# Patient Record
Sex: Female | Born: 1935 | Race: Black or African American | Hispanic: No | Smoking: Former smoker
Health system: Southern US, Community
[De-identification: ages and names within clinical notes are randomized; demographics above are authoritative.]

## PROBLEM LIST (undated history)

## (undated) DIAGNOSIS — E119 Type 2 diabetes mellitus without complications: Secondary | ICD-10-CM

## (undated) DIAGNOSIS — E7800 Pure hypercholesterolemia, unspecified: Secondary | ICD-10-CM

## (undated) DIAGNOSIS — I10 Essential (primary) hypertension: Secondary | ICD-10-CM

## (undated) DIAGNOSIS — M109 Gout, unspecified: Secondary | ICD-10-CM

## (undated) DIAGNOSIS — I639 Cerebral infarction, unspecified: Secondary | ICD-10-CM

## (undated) HISTORY — PX: TUBAL LIGATION: SHX77

---

## 2004-07-09 ENCOUNTER — Ambulatory Visit: Payer: Self-pay | Admitting: Gastroenterology

## 2004-07-12 ENCOUNTER — Ambulatory Visit: Payer: Self-pay | Admitting: Gastroenterology

## 2005-12-08 ENCOUNTER — Ambulatory Visit: Payer: Self-pay | Admitting: Family Medicine

## 2007-08-31 ENCOUNTER — Ambulatory Visit: Payer: Self-pay | Admitting: Internal Medicine

## 2008-11-05 ENCOUNTER — Ambulatory Visit: Payer: Self-pay | Admitting: Family Medicine

## 2010-01-18 ENCOUNTER — Ambulatory Visit: Payer: Self-pay | Admitting: Family Medicine

## 2011-01-20 ENCOUNTER — Ambulatory Visit: Payer: Self-pay | Admitting: Family Medicine

## 2012-01-27 ENCOUNTER — Ambulatory Visit: Payer: Self-pay | Admitting: Family Medicine

## 2012-01-31 ENCOUNTER — Ambulatory Visit: Payer: Self-pay | Admitting: Family Medicine

## 2013-02-19 ENCOUNTER — Ambulatory Visit: Payer: Self-pay | Admitting: Family Medicine

## 2014-02-27 ENCOUNTER — Ambulatory Visit: Payer: Self-pay | Admitting: Family Medicine

## 2015-04-27 ENCOUNTER — Emergency Department: Payer: Medicare PPO

## 2015-04-27 ENCOUNTER — Observation Stay
Admission: EM | Admit: 2015-04-27 | Discharge: 2015-04-28 | Disposition: A | Discharge status: Home / Self Care | Payer: Medicare PPO | Attending: Internal Medicine | Admitting: Internal Medicine

## 2015-04-27 DIAGNOSIS — Z9851 Tubal ligation status: Secondary | ICD-10-CM | POA: Insufficient documentation

## 2015-04-27 DIAGNOSIS — E1165 Type 2 diabetes mellitus with hyperglycemia: Secondary | ICD-10-CM | POA: Insufficient documentation

## 2015-04-27 DIAGNOSIS — I639 Cerebral infarction, unspecified: Secondary | ICD-10-CM

## 2015-04-27 DIAGNOSIS — I10 Essential (primary) hypertension: Secondary | ICD-10-CM | POA: Insufficient documentation

## 2015-04-27 DIAGNOSIS — R4781 Slurred speech: Secondary | ICD-10-CM | POA: Diagnosis not present

## 2015-04-27 DIAGNOSIS — M8580 Other specified disorders of bone density and structure, unspecified site: Secondary | ICD-10-CM | POA: Insufficient documentation

## 2015-04-27 DIAGNOSIS — G459 Transient cerebral ischemic attack, unspecified: Secondary | ICD-10-CM | POA: Diagnosis not present

## 2015-04-27 DIAGNOSIS — E780 Pure hypercholesterolemia: Secondary | ICD-10-CM | POA: Insufficient documentation

## 2015-04-27 DIAGNOSIS — R42 Dizziness and giddiness: Secondary | ICD-10-CM | POA: Insufficient documentation

## 2015-04-27 DIAGNOSIS — Z8249 Family history of ischemic heart disease and other diseases of the circulatory system: Secondary | ICD-10-CM | POA: Diagnosis not present

## 2015-04-27 DIAGNOSIS — Z79899 Other long term (current) drug therapy: Secondary | ICD-10-CM | POA: Insufficient documentation

## 2015-04-27 DIAGNOSIS — R55 Syncope and collapse: Secondary | ICD-10-CM | POA: Diagnosis not present

## 2015-04-27 DIAGNOSIS — Z888 Allergy status to other drugs, medicaments and biological substances status: Secondary | ICD-10-CM | POA: Diagnosis not present

## 2015-04-27 DIAGNOSIS — E785 Hyperlipidemia, unspecified: Secondary | ICD-10-CM | POA: Diagnosis not present

## 2015-04-27 HISTORY — DX: Essential (primary) hypertension: I10

## 2015-04-27 HISTORY — DX: Pure hypercholesterolemia, unspecified: E78.00

## 2015-04-27 HISTORY — DX: Type 2 diabetes mellitus without complications: E11.9

## 2015-04-27 LAB — CBC WITH DIFFERENTIAL/PLATELET
BASOS ABS: 0.1 10*3/uL (ref 0–0.1)
Basophils Relative: 1 %
Eosinophils Absolute: 0.3 10*3/uL (ref 0–0.7)
Eosinophils Relative: 4 %
HEMATOCRIT: 33.9 % — AB (ref 35.0–47.0)
HEMOGLOBIN: 10.4 g/dL — AB (ref 12.0–16.0)
LYMPHS PCT: 38 %
Lymphs Abs: 2.9 10*3/uL (ref 1.0–3.6)
MCH: 22.4 pg — ABNORMAL LOW (ref 26.0–34.0)
MCHC: 30.8 g/dL — ABNORMAL LOW (ref 32.0–36.0)
MCV: 72.5 fL — AB (ref 80.0–100.0)
MONO ABS: 0.6 10*3/uL (ref 0.2–0.9)
Monocytes Relative: 8 %
NEUTROS ABS: 3.8 10*3/uL (ref 1.4–6.5)
NEUTROS PCT: 49 %
Platelets: 306 10*3/uL (ref 150–440)
RBC: 4.67 MIL/uL (ref 3.80–5.20)
RDW: 15.7 % — AB (ref 11.5–14.5)
WBC: 7.8 10*3/uL (ref 3.6–11.0)

## 2015-04-27 LAB — BASIC METABOLIC PANEL
ANION GAP: 10 (ref 5–15)
BUN: 19 mg/dL (ref 6–20)
CHLORIDE: 101 mmol/L (ref 101–111)
CO2: 27 mmol/L (ref 22–32)
Calcium: 9 mg/dL (ref 8.9–10.3)
Creatinine, Ser: 1.03 mg/dL — ABNORMAL HIGH (ref 0.44–1.00)
GFR calc Af Amer: 60 mL/min — ABNORMAL LOW (ref >60)
GFR calc non Af Amer: 51 mL/min — ABNORMAL LOW (ref >60)
GLUCOSE: 144 mg/dL — AB (ref 65–99)
POTASSIUM: 3.2 mmol/L — AB (ref 3.5–5.1)
Sodium: 138 mmol/L (ref 135–145)

## 2015-04-27 LAB — URINALYSIS COMPLETE WITH MICROSCOPIC (ARMC ONLY)
BACTERIA UA: NONE SEEN
Bilirubin Urine: NEGATIVE
GLUCOSE, UA: NEGATIVE mg/dL
Hgb urine dipstick: NEGATIVE
KETONES UR: NEGATIVE mg/dL
Leukocytes, UA: NEGATIVE
NITRITE: NEGATIVE
Protein, ur: NEGATIVE mg/dL
RBC / HPF: NONE SEEN RBC/hpf (ref 0–5)
SPECIFIC GRAVITY, URINE: 1.006 (ref 1.005–1.030)
Squamous Epithelial / LPF: NONE SEEN
pH: 6 (ref 5.0–8.0)

## 2015-04-27 LAB — TROPONIN I

## 2015-04-27 NOTE — ED Notes (Signed)
Went to MD today and BP was elevated. Did daily activities, returned to MD for observation and was sent home. Went home and took a nap and when she woke up, felt "funny".

## 2015-04-27 NOTE — ED Provider Notes (Addendum)
[facility] Emergency Department Provider Note  ____________________________________________  Time seen: Seen upon arrival to the emergency department  I have reviewed the triage vital signs and the nursing notes.   HISTORY  Chief Complaint Hypertension    HPI Anne Herrera is a 79 y.o. female with a history of hypertension and diabetes and high cholesterol who is presenting today with 1 hour of lightheadedness and near syncope. The patient said that she last felt normal at 4 PM. She said that she took a nap at 4 PM and awoke at 5 and when she stood up and became lightheaded and felt like she was going to pass out. She denied any chest pain or shortness of breath at that time. However, did state that she felt as if she was off balance needed to grab onto something to brace herself. She denied any dizziness. Denied any racing feeling in her chest or palpitations. Says that she is a strong family history of stroke. Was having some difficulty with her blood pressure today at a routine physical. [place] Spaniel that her pressures were elevated but came down with repeat measurement. She reports compliant with her medication. No headache. Denies any weakness.Said took 181 mg tablet of aspirin earlier this evening. Lightheadedness resolved after one hour.   Past Medical History  Diagnosis Date  . Diabetes mellitus without complication   . Hypertension   . High cholesterol     There are no active problems to display for this patient.   Past Surgical History  Procedure Laterality Date  . Tubal ligation      No current outpatient prescriptions on file.  Allergies Amlodipine and Lisinopril  No family history on file.  Social History Social History  Substance Use Topics  . Smoking status: Never Smoker   . Smokeless tobacco: Never Used  . Alcohol Use: No    Review of Systems Constitutional: No fever/chills Eyes: No visual changes. ENT: No sore  throat. Cardiovascular: Denies chest pain. Respiratory: Denies shortness of breath. Gastrointestinal: No abdominal pain.  No nausea, no vomiting.  No diarrhea.  No constipation. Genitourinary: Negative for dysuria. Musculoskeletal: Negative for back pain. Skin: Negative for rash. Neurological: Negative for headaches, focal weakness or numbness.  10-point ROS otherwise negative.  ____________________________________________   PHYSICAL EXAM:  VITAL SIGNS: ED Triage Vitals  Enc Vitals Group     BP 04/27/15 2034 172/95 mmHg     Pulse Rate 04/27/15 2034 62     Resp 04/27/15 2034 18     Temp 04/27/15 2034 98.3 F (36.8 C)     Temp Source 04/27/15 2034 Oral     SpO2 04/27/15 2034 98 %     Weight 04/27/15 2034 188 lb (85.276 kg)     Height 04/27/15 2034 5\' 4"  (1.626 m)     Head Cir --      Peak Flow --      Pain Score 04/27/15 2035 0     Pain Loc --      Pain Edu? --      Excl. in GC? --     Constitutional: Alert and oriented. Well appearing and in no acute distress. Eyes: Conjunctivae are normal. PERRL. EOMI. Head: Atraumatic. Nose: No congestion/rhinnorhea. Mouth/Throat: Mucous membranes are moist.  Oropharynx non-erythematous. Neck: No stridor.   Cardiovascular: Normal rate, regular rhythm. Grossly normal heart sounds.  Good peripheral circulation. Respiratory: Normal respiratory effort.  No retractions. Lungs CTAB. Gastrointestinal: Soft and nontender. No distention. No abdominal bruits. No  CVA tenderness. Musculoskeletal: No lower extremity tenderness nor edema.  No joint effusions. Neurologic:  Normal speech and language. Patient is unstable transferring from the stretcher to the bed. Needs assistance for this transfer. Also with right sided upper extremity ataxia on finger to nose testing. No nystagmus. Skin:  Skin is warm, dry and intact. No rash noted. Psychiatric: Mood and affect are normal. Speech and behavior are normal.  NIH Stroke Scale   Person  Administering Scale: Arelia Longest  Administer stroke scale items in the order listed. Record performance in each category after each subscale exam. Do not go back and change scores. Follow directions provided for each exam technique. Scores should reflect what the patient does, not what the clinician thinks the patient can do. The clinician should record answers while administering the exam and work quickly. Except where indicated, the patient should not be coached (i.e., repeated requests to patient to make a special effort).   1a  Level of consciousness: 0=alert; keenly responsive  1b. LOC questions:  0=Performs both tasks correctly  1c. LOC commands: 0=Performs both tasks correctly  2.  Best Gaze: 0=normal  3.  Visual: 0=No visual loss  4. Facial Palsy: 0=Normal symmetric movement  5a.  Motor left arm: 0=No drift, limb holds 90 (or 45) degrees for full 10 seconds  5b.  Motor right arm: 0=No drift, limb holds 90 (or 45) degrees for full 10 seconds  6a. motor left leg: 0=No drift, limb holds 90 (or 45) degrees for full 10 seconds  6b  Motor right leg:  0=No drift, limb holds 90 (or 45) degrees for full 10 seconds  7. Limb Ataxia: 1=Present in one limb  8.  Sensory: 0=Normal; no sensory loss  9. Best Language:  0=No aphasia, normal  10. Dysarthria: 0=Normal  11. Extinction and Inattention: 0=No abnormality  12. Distal motor function: 0=Normal   Total:   1   ____________________________________________   LABS (all labs ordered are listed, but only abnormal results are displayed)  Labs Reviewed  URINALYSIS COMPLETEWITH MICROSCOPIC ([facility] ONLY) - Abnormal; Notable for the following:    Color, Urine STRAW (*)    APPearance CLEAR (*)    All other components within normal limits  CBC WITH DIFFERENTIAL/PLATELET - Abnormal; Notable for the following:    Hemoglobin 10.4 (*)    HCT 33.9 (*)    MCV 72.5 (*)    MCH 22.4 (*)    MCHC 30.8 (*)    RDW 15.7 (*)    All other components  within normal limits  BASIC METABOLIC PANEL - Abnormal; Notable for the following:    Potassium 3.2 (*)    Glucose, Bld 144 (*)    Creatinine, Ser 1.03 (*)    GFR calc non Af Amer 51 (*)    GFR calc Af Amer 60 (*)    All other components within normal limits  TROPONIN I   ____________________________________________  EKG  ED ECG REPORT I, Arelia Longest, the attending physician, personally viewed and interpreted this ECG.   Date: 04/27/2015  EKG Time: 2041  Rate: 65  Rhythm: Normal sinus rhythm with sinus arrhythmia  Axis: Normal axis  Intervals:none  ST&T Change: No ST segment elevations or depressions. No abnormal T-wave inversions.  ____________________________________________  RADIOLOGY  The brain which is normal for her age with mild atrophy. ____________________________________________   PROCEDURES  ____________________________________________   INITIAL IMPRESSION / ASSESSMENT AND PLAN / ED COURSE  Pertinent labs & imaging results  that were available during my care of the patient were reviewed by me and considered in my medical decision making (see chart for details).  Patient last normal prior to sleeping 4 and a half hours prior to arrival which puts her out of the TPA window for stroke. Also, the patient had improvement of her symptoms with resolution of the lightheadedness.   ----------------------------------------- 12:00 AM on 04/28/2015 -----------------------------------------  Patient resting comfortably at this time. The [facility] neurology was consult because when the patient was assisted to the bathroom she was dragging her right foot and said that she felt right-sided weakness to her right lower as well as upper extremity. There was also a facial droop on the right that was thought to be seen by the nurse. However, when I dilated the patient she has symmetric smile and her friend who is at her bedside said that her smile appeared normal for her. The  [facility] neurologist did not recommend TPA at this time. He kept a window at 4 PM needed patient was still outside the appropriate time frame for TPA. Patient took 81 mg of aspirin prior to arrival. We'll give 242 more milligrams here in the emergency department. Patient as well as companion are aware of the plan for admission and further workup and are willing to comply.  ____________________________________________   FINAL CLINICAL IMPRESSION(S) / ED DIAGNOSES  Acute stroke. Initial visit.    Myrna Blazer, MD 04/28/15 0004  Sign out to Dr. Clint Guy for admission to the hospital.  Myrna Blazer, MD 04/28/15 [phone]

## 2015-04-27 NOTE — ED Notes (Signed)
Patient states she is feeling weaker on the right side. Per the tech that assisted her to the bathroom, the patient was dragging her right leg.

## 2015-04-27 NOTE — ED Notes (Signed)
Nephrology SOC in progress.

## 2015-04-28 ENCOUNTER — Observation Stay: Payer: Medicare PPO

## 2015-04-28 DIAGNOSIS — G459 Transient cerebral ischemic attack, unspecified: Secondary | ICD-10-CM | POA: Diagnosis present

## 2015-04-28 LAB — GLUCOSE, CAPILLARY
Glucose-Capillary: 157 mg/dL — ABNORMAL HIGH (ref 65–99)
Glucose-Capillary: 154 mg/dL — ABNORMAL HIGH (ref 65–99)

## 2015-04-28 LAB — HEMOGLOBIN A1C: Hgb A1c MFr Bld: 7.8 % — ABNORMAL HIGH (ref 4.0–6.0)

## 2015-04-28 LAB — TSH: TSH: 2.56 u[IU]/mL (ref 0.350–4.500)

## 2015-04-28 MED ORDER — STROKE: EARLY STAGES OF RECOVERY BOOK
Freq: Once | Status: AC
  Administered 2015-04-28: 03:00:00

## 2015-04-28 MED ORDER — CHLORTHALIDONE 25 MG PO TABS
25.0000 mg | ORAL_TABLET | Freq: Every day | ORAL | Status: DC
  Administered 2015-04-28: 08:00:00 25 mg via ORAL
  Filled 2015-04-28: qty 1

## 2015-04-28 MED ORDER — SODIUM CHLORIDE 0.9 % IJ SOLN
3.0000 mL | Freq: Two times a day (BID) | INTRAMUSCULAR | Status: DC
  Administered 2015-04-28: 3 mL via INTRAVENOUS

## 2015-04-28 MED ORDER — SIMVASTATIN 20 MG PO TABS: 20.0000 mg | ORAL_TABLET | Freq: Every day | ORAL | Status: DC

## 2015-04-28 MED ORDER — CARVEDILOL 25 MG PO TABS
25.0000 mg | ORAL_TABLET | Freq: Two times a day (BID) | ORAL | Status: DC
  Administered 2015-04-28: 25 mg via ORAL
  Filled 2015-04-28: qty 1

## 2015-04-28 MED ORDER — ATORVASTATIN CALCIUM 10 MG PO TABS: 5.0000 mg | ORAL_TABLET | Freq: Every day | ORAL | Status: DC

## 2015-04-28 MED ORDER — ONDANSETRON HCL 4 MG PO TABS: 4.0000 mg | ORAL_TABLET | Freq: Four times a day (QID) | ORAL | Status: DC | PRN

## 2015-04-28 MED ORDER — INSULIN ASPART 100 UNIT/ML ~~LOC~~ SOLN
0.0000 [IU] | Freq: Three times a day (TID) | SUBCUTANEOUS | Status: DC
  Administered 2015-04-28 (×2): 3 [IU] via SUBCUTANEOUS
  Filled 2015-04-28 (×2): qty 3

## 2015-04-28 MED ORDER — ACETAMINOPHEN 650 MG RE SUPP: 650.0000 mg | Freq: Four times a day (QID) | RECTAL | Status: DC | PRN

## 2015-04-28 MED ORDER — INSULIN ASPART 100 UNIT/ML ~~LOC~~ SOLN: 0.0000 [IU] | Freq: Every day | SUBCUTANEOUS | Status: DC

## 2015-04-28 MED ORDER — POTASSIUM CHLORIDE CRYS ER 20 MEQ PO TBCR
40.0000 meq | EXTENDED_RELEASE_TABLET | Freq: Two times a day (BID) | ORAL | Status: DC
  Administered 2015-04-28 (×2): 40 meq via ORAL
  Filled 2015-04-28 (×2): qty 2

## 2015-04-28 MED ORDER — ASPIRIN 81 MG PO CHEW
243.0000 mg | CHEWABLE_TABLET | Freq: Once | ORAL | Status: AC
  Administered 2015-04-28: 243 mg via ORAL
  Filled 2015-04-28: qty 3

## 2015-04-28 MED ORDER — ACETAMINOPHEN 325 MG PO TABS: 650.0000 mg | ORAL_TABLET | Freq: Four times a day (QID) | ORAL | Status: DC | PRN

## 2015-04-28 MED ORDER — HEPARIN SODIUM (PORCINE) 5000 UNIT/ML IJ SOLN
5000.0000 [IU] | Freq: Three times a day (TID) | INTRAMUSCULAR | Status: DC
  Administered 2015-04-28: 06:00:00 5000 [IU] via SUBCUTANEOUS
  Filled 2015-04-28: qty 1

## 2015-04-28 MED ORDER — ASPIRIN 325 MG PO TABS: 325.0000 mg | ORAL_TABLET | Freq: Every day | ORAL | Status: DC

## 2015-04-28 MED ORDER — LOSARTAN POTASSIUM 50 MG PO TABS
100.0000 mg | ORAL_TABLET | Freq: Every day | ORAL | Status: DC
  Administered 2015-04-28: 100 mg via ORAL
  Filled 2015-04-28: qty 2

## 2015-04-28 MED ORDER — DOCUSATE SODIUM 100 MG PO CAPS
100.0000 mg | ORAL_CAPSULE | Freq: Two times a day (BID) | ORAL | Status: DC
  Filled 2015-04-28: qty 1

## 2015-04-28 MED ORDER — ONDANSETRON HCL 4 MG/2ML IJ SOLN: 4.0000 mg | Freq: Four times a day (QID) | INTRAMUSCULAR | Status: DC | PRN

## 2015-04-28 MED ORDER — ASPIRIN 325 MG PO TABS
325.0000 mg | ORAL_TABLET | Freq: Every day | ORAL | Status: DC
  Administered 2015-04-28: 325 mg via ORAL
  Filled 2015-04-28: qty 1

## 2015-04-28 NOTE — H&P (Signed)
Anne Herrera is an 79 y.o. female.   Chief Complaint: Imbalance HPI: The patient presents emergency department via EMS after becoming lightheaded and feeling as if she were leaning to her right side. The patient denies falling. She also denies nausea, vomiting or visual changes. When EMS arrived to her home she reports they found her blood pressure to be 170/120. She denies headache, chest pain or shortness of breath. In the emergency department the patient was found to drag her right foot as she walked which prompted urgent neurologic consult. CT of her head was negative but [facility] consultation recommended valuation for stroke, albeit that the patient was outside of the wound oh for TPA. For these reasons emergency department staff called for admission.  Past Medical History  Diagnosis Date  . Diabetes mellitus without complication   . Hypertension   . High cholesterol     Past Surgical History  Procedure Laterality Date  . Tubal ligation      Family History  Problem Relation Age of Onset  . Hypertension      all family members  . CAD Mother   . CAD Brother    Social History:  reports that she has never smoked. She has never used smokeless tobacco. She reports that she does not drink alcohol or use illicit drugs.  Allergies:  Allergies  Allergen Reactions  . Amlodipine Hives and Itching  . Lisinopril Swelling    Reaction:  Mouth swelling    Medications Prior to Admission  Medication Sig Dispense Refill  . atorvastatin (LIPITOR) 10 MG tablet Take 5 mg by mouth daily.    . carvedilol (COREG) 25 MG tablet Take 25 mg by mouth 2 (two) times daily with a meal.    . chlorthalidone (HYGROTON) 25 MG tablet Take 25 mg by mouth daily.    Marland Kitchen glipiZIDE (GLUCOTROL XL) 2.5 MG 24 hr tablet Take 2.5 mg by mouth daily.    Marland Kitchen losartan (COZAAR) 100 MG tablet Take 100 mg by mouth daily.    . metFORMIN (GLUCOPHAGE-XR) 500 MG 24 hr tablet Take 1,000 mg by mouth daily.    . potassium chloride  (MICRO-K) 10 MEQ CR capsule Take 20 mEq by mouth daily.      Results for orders placed or performed during the hospital encounter of 04/27/15 (from the past 48 hour(s))  Troponin I     Status: None   Collection Time: 04/27/15  9:20 PM  Result Value Ref Range   Troponin I <0.03 <0.031 ng/mL    Comment:        NO INDICATION OF MYOCARDIAL INJURY.   CBC with Differential     Status: Abnormal   Collection Time: 04/27/15  9:20 PM  Result Value Ref Range   WBC 7.8 3.6 - 11.0 K/uL   RBC 4.67 3.80 - 5.20 MIL/uL   Hemoglobin 10.4 (L) 12.0 - 16.0 g/dL   HCT 33.9 (L) 35.0 - 47.0 %   MCV 72.5 (L) 80.0 - 100.0 fL   MCH 22.4 (L) 26.0 - 34.0 pg   MCHC 30.8 (L) 32.0 - 36.0 g/dL   RDW 15.7 (H) 11.5 - 14.5 %   Platelets 306 150 - 440 K/uL   Neutrophils Relative % 49 %   Neutro Abs 3.8 1.4 - 6.5 K/uL   Lymphocytes Relative 38 %   Lymphs Abs 2.9 1.0 - 3.6 K/uL   Monocytes Relative 8 %   Monocytes Absolute 0.6 0.2 - 0.9 K/uL   Eosinophils Relative 4 %  Eosinophils Absolute 0.3 0 - 0.7 K/uL   Basophils Relative 1 %   Basophils Absolute 0.1 0 - 0.1 K/uL  Basic metabolic panel     Status: Abnormal   Collection Time: 04/27/15  9:20 PM  Result Value Ref Range   Sodium 138 135 - 145 mmol/L   Potassium 3.2 (L) 3.5 - 5.1 mmol/L   Chloride 101 101 - 111 mmol/L   CO2 27 22 - 32 mmol/L   Glucose, Bld 144 (H) 65 - 99 mg/dL   BUN 19 6 - 20 mg/dL   Creatinine, Ser 1.03 (H) 0.44 - 1.00 mg/dL   Calcium 9.0 8.9 - 10.3 mg/dL   GFR calc non Af Amer 51 (L) >60 mL/min   GFR calc Af Amer 60 (L) >60 mL/min    Comment: (NOTE) The eGFR has been calculated using the CKD EPI equation. This calculation has not been validated in all clinical situations. eGFR's persistently <60 mL/min signify possible Chronic Kidney Disease.    Anion gap 10 5 - 15  Urinalysis complete, with microscopic ([facility] only)     Status: Abnormal   Collection Time: 04/27/15 10:52 PM  Result Value Ref Range   Color, Urine STRAW (A) YELLOW    APPearance CLEAR (A) CLEAR   Glucose, UA NEGATIVE NEGATIVE mg/dL   Bilirubin Urine NEGATIVE NEGATIVE   Ketones, ur NEGATIVE NEGATIVE mg/dL   Specific Gravity, Urine 1.006 1.005 - 1.030   Hgb urine dipstick NEGATIVE NEGATIVE   pH 6.0 5.0 - 8.0   Protein, ur NEGATIVE NEGATIVE mg/dL   Nitrite NEGATIVE NEGATIVE   Leukocytes, UA NEGATIVE NEGATIVE   RBC / HPF NONE SEEN 0 - 5 RBC/hpf   WBC, UA 0-5 0 - 5 WBC/hpf   Bacteria, UA NONE SEEN NONE SEEN   Squamous Epithelial / LPF NONE SEEN NONE SEEN   Mucous PRESENT    Dg Chest 1 View  04/27/2015   CLINICAL DATA:  Dizziness  EXAM: CHEST  1 VIEW  COMPARISON:  None.  FINDINGS: The heart size and mediastinal contours are within normal limits. Both lungs are clear. The aorta is unfolded and ectatic. The visualized skeletal structures are unremarkable.  IMPRESSION: No active disease.   Electronically Signed   By: Conchita Paris M.D.   On: 04/27/2015 22:02   Ct Head Wo Contrast  04/27/2015   CLINICAL DATA:  On blood pressure today, near syncope, dizzy  EXAM: CT HEAD WITHOUT CONTRAST  TECHNIQUE: Contiguous axial images were obtained from the base of the skull through the vertex without intravenous contrast.  COMPARISON:  None.  FINDINGS: Mild age-appropriate atrophy. No evidence of hemorrhage or infarct. No mass or hydrocephalus. Calvarium intact.  IMPRESSION: Head CT normal for age with mild atrophy.   Electronically Signed   By: Skipper Cliche M.D.   On: 04/27/2015 21:48    Review of Systems  Constitutional: Negative for fever and chills.  HENT: Negative for sore throat and tinnitus.   Eyes: Negative for blurred vision and redness.  Respiratory: Negative for cough and shortness of breath.   Cardiovascular: Negative for chest pain, palpitations, orthopnea and PND.  Gastrointestinal: Negative for nausea, vomiting, abdominal pain and diarrhea.  Genitourinary: Negative for dysuria, urgency and frequency.  Musculoskeletal: Negative for myalgias and joint  pain.  Skin: Negative for rash.       No lesions  Neurological: Positive for speech change. Negative for focal weakness and weakness.       Lightheadedness; imbalance (to the right)  Endo/Heme/Allergies: Does not bruise/bleed easily.       No temperature intolerance  Psychiatric/Behavioral: Negative for depression and suicidal ideas.    Blood pressure 164/69, pulse 54, temperature 97.8 F (36.6 C), temperature source Oral, resp. rate 20, height _0  (1.626 m), weight 85.73 kg (189 lb), SpO2 97 %. Physical Exam  Vitals reviewed. Constitutional: She is oriented to person, place, and time. She appears well-developed and well-nourished. No distress.  HENT:  Head: Normocephalic and atraumatic.  Mouth/Throat: Oropharynx is clear and moist.  Eyes: Conjunctivae and EOM are normal. Pupils are equal, round, and reactive to light. No scleral icterus.  Neck: Normal range of motion. Neck supple. No JVD present. No tracheal deviation present. No thyromegaly present.  Cardiovascular: Normal rate, regular rhythm and normal heart sounds.  Exam reveals no gallop and no friction rub.   No murmur heard. Respiratory: Effort normal and breath sounds normal.  GI: Soft. Bowel sounds are normal. She exhibits no distension. There is no tenderness.  Genitourinary:  Deferred  Musculoskeletal: Normal range of motion. She exhibits no edema.  Lymphadenopathy:    She has no cervical adenopathy.  Neurological: She is alert and oriented to person, place, and time. She has normal strength. A cranial nerve deficit and sensory deficit (mildly muted sensation right calf) is present. She exhibits normal muscle tone. Coordination normal.  Subtle right-sided facial droop  Skin: Skin is warm and dry.  Psychiatric: She has a normal mood and affect. Her behavior is normal. Judgment and thought content normal.     Assessment/Plan This is a 79 year old African American female admitted for TIA. 1. TIA: At this time the  patient has some minor sensory discrepancy between the left lower extremity and right leg, as well as a slight right-sided facial droop. No acute infarct seen on CT scan however we will obtain MRI of the brain and carotid ultrasounds. Neurology consultation ordered for the morning. Permissive hypertension until that time. 2. Hypertension: Continue home regimen in the morning 3. Diabetes mellitus type 2: Hold metformin and oral hypoglycemics; start sliding scale insulin while hospitalized 4. Hyperlipidemia: Continue atorvastatin 5. DVT prophylaxis: Heparin 6. GI prophylaxis: None The patient is a full code. Time spent on admission orders and patient care approximately 35 minutes  Harrie Foreman 04/28/2015, 3:38 AM

## 2015-04-28 NOTE — Consult Note (Signed)
Reason for Consult: stroke Referring Physician:  Dr. Aram Candela Anne Herrera is an 79 y.o. female.  HPI: seen at request of Dr. Benjie Karvonen for possible stroke;  79 yo RHD F presents to [facility] secondary to sudden onset of dizziness and falling to R after she awakened.  In ER, she was not a tPA candidate due to unknown onset time.  She also noted that she was dragging her R leg.  She denies LOC or headache.  Past Medical History  Diagnosis Date  . Diabetes mellitus without complication   . Hypertension   . High cholesterol     Past Surgical History  Procedure Laterality Date  . Tubal ligation      Family History  Problem Relation Age of Onset  . Hypertension      all family members  . CAD Mother   . CAD Brother     Social History:  reports that she has never smoked. She has never used smokeless tobacco. She reports that she does not drink alcohol or use illicit drugs.  Allergies:  Allergies  Allergen Reactions  . Amlodipine Hives and Itching  . Lisinopril Swelling    Reaction:  Mouth swelling    Medications: personally reviewed by me per chart  Results for orders placed or performed during the hospital encounter of 04/27/15 (from the past 48 hour(s))  Troponin I     Status: None   Collection Time: 04/27/15  9:20 PM  Result Value Ref Range   Troponin I <0.03 <0.031 ng/mL    Comment:        NO INDICATION OF MYOCARDIAL INJURY.   CBC with Differential     Status: Abnormal   Collection Time: 04/27/15  9:20 PM  Result Value Ref Range   WBC 7.8 3.6 - 11.0 K/uL   RBC 4.67 3.80 - 5.20 MIL/uL   Hemoglobin 10.4 (L) 12.0 - 16.0 g/dL   HCT 33.9 (L) 35.0 - 47.0 %   MCV 72.5 (L) 80.0 - 100.0 fL   MCH 22.4 (L) 26.0 - 34.0 pg   MCHC 30.8 (L) 32.0 - 36.0 g/dL   RDW 15.7 (H) 11.5 - 14.5 %   Platelets 306 150 - 440 K/uL   Neutrophils Relative % 49 %   Neutro Abs 3.8 1.4 - 6.5 K/uL   Lymphocytes Relative 38 %   Lymphs Abs 2.9 1.0 - 3.6 K/uL   Monocytes Relative 8 %   Monocytes  Absolute 0.6 0.2 - 0.9 K/uL   Eosinophils Relative 4 %   Eosinophils Absolute 0.3 0 - 0.7 K/uL   Basophils Relative 1 %   Basophils Absolute 0.1 0 - 0.1 K/uL  Basic metabolic panel     Status: Abnormal   Collection Time: 04/27/15  9:20 PM  Result Value Ref Range   Sodium 138 135 - 145 mmol/L   Potassium 3.2 (L) 3.5 - 5.1 mmol/L   Chloride 101 101 - 111 mmol/L   CO2 27 22 - 32 mmol/L   Glucose, Bld 144 (H) 65 - 99 mg/dL   BUN 19 6 - 20 mg/dL   Creatinine, Ser 1.03 (H) 0.44 - 1.00 mg/dL   Calcium 9.0 8.9 - 10.3 mg/dL   GFR calc non Af Amer 51 (L) >60 mL/min   GFR calc Af Amer 60 (L) >60 mL/min    Comment: (NOTE) The eGFR has been calculated using the CKD EPI equation. This calculation has not been validated in all clinical situations. eGFR's persistently <60 mL/min  signify possible Chronic Kidney Disease.    Anion gap 10 5 - 15  TSH     Status: None   Collection Time: 04/27/15  9:20 PM  Result Value Ref Range   TSH 2.560 0.350 - 4.500 uIU/mL  Hemoglobin A1c     Status: Abnormal   Collection Time: 04/27/15  9:20 PM  Result Value Ref Range   Hgb A1c MFr Bld 7.8 (H) 4.0 - 6.0 %  Urinalysis complete, with microscopic ([facility] only)     Status: Abnormal   Collection Time: 04/27/15 10:52 PM  Result Value Ref Range   Color, Urine STRAW (A) YELLOW   APPearance CLEAR (A) CLEAR   Glucose, UA NEGATIVE NEGATIVE mg/dL   Bilirubin Urine NEGATIVE NEGATIVE   Ketones, ur NEGATIVE NEGATIVE mg/dL   Specific Gravity, Urine 1.006 1.005 - 1.030   Hgb urine dipstick NEGATIVE NEGATIVE   pH 6.0 5.0 - 8.0   Protein, ur NEGATIVE NEGATIVE mg/dL   Nitrite NEGATIVE NEGATIVE   Leukocytes, UA NEGATIVE NEGATIVE   RBC / HPF NONE SEEN 0 - 5 RBC/hpf   WBC, UA 0-5 0 - 5 WBC/hpf   Bacteria, UA NONE SEEN NONE SEEN   Squamous Epithelial / LPF NONE SEEN NONE SEEN   Mucous PRESENT   Glucose, capillary     Status: Abnormal   Collection Time: 04/28/15  7:36 AM  Result Value Ref Range   Glucose-Capillary 157  (H) 65 - 99 mg/dL   Comment 1 Notify RN   Glucose, capillary     Status: Abnormal   Collection Time: 04/28/15 11:12 AM  Result Value Ref Range   Glucose-Capillary 154 (H) 65 - 99 mg/dL   Comment 1 Notify RN     Dg Chest 1 View  04/27/2015   CLINICAL DATA:  Dizziness  EXAM: CHEST  1 VIEW  COMPARISON:  None.  FINDINGS: The heart size and mediastinal contours are within normal limits. Both lungs are clear. The aorta is unfolded and ectatic. The visualized skeletal structures are unremarkable.  IMPRESSION: No active disease.   Electronically Signed   By: Conchita Paris M.D.   On: 04/27/2015 22:02   Ct Head Wo Contrast  04/27/2015   CLINICAL DATA:  On blood pressure today, near syncope, dizzy  EXAM: CT HEAD WITHOUT CONTRAST  TECHNIQUE: Contiguous axial images were obtained from the base of the skull through the vertex without intravenous contrast.  COMPARISON:  None.  FINDINGS: Mild age-appropriate atrophy. No evidence of hemorrhage or infarct. No mass or hydrocephalus. Calvarium intact.  IMPRESSION: Head CT normal for age with mild atrophy.   Electronically Signed   By: Skipper Cliche M.D.   On: 04/27/2015 21:48   Mr Brain Wo Contrast  04/28/2015   CLINICAL DATA:  79 year old diabetic hypertensive female with high cholesterol presenting with lightheadedness originating 1 day ago. Leaning towards the right. Dragging right foot and slurred speech. Symptoms have improved. Subsequent encounter.  EXAM: MRI HEAD WITHOUT CONTRAST  TECHNIQUE: Multiplanar, multiecho pulse sequences of the brain and surrounding structures were obtained without intravenous contrast.  COMPARISON:  04/27/2015 head CT.  FINDINGS: Acute nonhemorrhagic infarct extends from the posterior superior left lenticular nucleus into the mid to posterior left corona radiata.  No intracranial hemorrhage.  Minimal small vessel disease type changes.  No hydrocephalus.  No intracranial mass lesion noted on this unenhanced exam.  Major intracranial  vascular structures are patent.  Minimal exophthalmos.  Cervical medullary junction, pituitary region and pineal region unremarkable.  Hyperostosis  frontalis interna incidentally noted.  IMPRESSION: Acute nonhemorrhagic infarct extends from the posterior superior left lenticular nucleus into the mid to posterior left corona radiata.  These results will be called to the ordering clinician or representative by the Radiologist Assistant, and communication documented in the PACS or zVision Dashboard.   Electronically Signed   By: Genia Del M.D.   On: 04/28/2015 10:20   US Carotid Bilateral  04/28/2015   CLINICAL DATA:  Stroke symptoms, hypertension, hyperlipidemia and diabetes  EXAM: BILATERAL CAROTID DUPLEX ULTRASOUND  TECHNIQUE: Pearline Cables scale imaging, color Doppler and duplex ultrasound were performed of bilateral carotid and vertebral arteries in the neck.  COMPARISON:  None.  FINDINGS: Criteria: Quantification of carotid stenosis is based on velocity parameters that correlate the residual internal carotid diameter with NASCET-based stenosis levels, using the diameter of the distal internal carotid lumen as the denominator for stenosis measurement.  The following velocity measurements were obtained:  RIGHT  ICA:  91/26 cm/sec  CCA:  87/56 cm/sec  SYSTOLIC ICA/CCA RATIO:  1.3  DIASTOLIC ICA/CCA RATIO:  1.9  ECA:  40 cm/sec  LEFT  ICA:  77/29 cm/sec  CCA:  43/32 cm/sec  SYSTOLIC ICA/CCA RATIO:  1.1  DIASTOLIC ICA/CCA RATIO:  1.9  ECA:  38 cm/sec  RIGHT CAROTID ARTERY: Minor echogenic shadowing plaque formation. No hemodynamically significant right ICA stenosis, velocity elevation, or turbulent flow. Degree of narrowing less than 50%.  RIGHT VERTEBRAL ARTERY:  Antegrade  LEFT CAROTID ARTERY: Similar scattered minor echogenic plaque formation. No hemodynamically significant left ICA stenosis, velocity elevation, or turbulent flow.  LEFT VERTEBRAL ARTERY:  Antegrade  IMPRESSION: Minor carotid atherosclerosis. No  hemodynamically significant ICA stenosis by ultrasound. Degree of narrowing less than 50% bilaterally.   Electronically Signed   By: Jerilynn Mages.  Shick M.D.   On: 04/28/2015 11:49    Review of Systems  Constitutional: Negative.   HENT: Negative.   Eyes: Negative.   Respiratory: Negative.   Cardiovascular: Negative.   Gastrointestinal: Negative.   Genitourinary: Negative.   Musculoskeletal: Negative.   Skin: Negative.   Neurological: Positive for focal weakness. Negative for dizziness, tingling, tremors, sensory change, speech change, seizures and loss of consciousness.  Endo/Heme/Allergies: Negative.   Psychiatric/Behavioral: Negative.    Blood pressure 177/73, pulse 49, temperature 98.1 F (36.7 C), temperature source Oral, resp. rate 18, height 5' 4"  (1.626 m), weight 85.73 kg (189 lb), SpO2 99 %. Physical Exam  Nursing note and vitals reviewed. Constitutional: She appears well-developed and well-nourished. No distress.  HENT:  Head: Normocephalic and atraumatic.  Right Ear: External ear normal.  Left Ear: External ear normal.  Nose: Nose normal.  Mouth/Throat: Oropharynx is clear and moist.  Eyes: Conjunctivae and EOM are normal. Pupils are equal, round, and reactive to light. No scleral icterus.  Neck: Normal range of motion.  Cardiovascular: Normal rate, regular rhythm, normal heart sounds and intact distal pulses.   Respiratory: Effort normal and breath sounds normal. No respiratory distress.  GI: Soft. Bowel sounds are normal. She exhibits no distension.  Musculoskeletal: Normal range of motion. She exhibits no edema.  Neurological:  Alert and oriented x 3, nl speech and language PERRLA, EOMI, nl VF, face symmetric, tongue mid 5/5 on L, 4/5 on R, nl tone FTN and HTS WNL 1+/4 B, mute plantars Decreased temp and pin on R hemibody  Skin: She is not diaphoretic.   MRI of brain personally reviewed by me and shows acute L IC infarct, trace white matter  changes  Assessment/Plan: 1.  Acute L internal capsule infarct-  Symptomatic with sensory and motor changes on L;  Etiology is secondary to uncontrolled DM, HTN and likely HLD -  Restart ASA 18m daily -  Adjust statin for goal LDL < 70 -  Needs better DM control with goal Hem A1c < 7 -  Needs better HTN control which can start now with goal < 130/80 -  Would be ok for outpatient PT -  Recommended that pt lose some weight and continue to exercise -  Will sign off, please call with questions -  [facility]  SFriedens8/30/2016, 4:20 PM

## 2015-04-28 NOTE — Evaluation (Signed)
Physical Therapy Evaluation Patient Details Name: Anne Herrera MRN: [id number] DOB: 1935/12/13 Today's Date: 04/28/2015   History of Present Illness  Pt is a 79 y.o. female presenting to hospital with imbalance, lightheadedness, and leaning to R side; pt dragging R foot in ED and noted to have R facial droop.  Head CT negative.  MRI brain shows acute nonhemorrhagic infarct extends from posterior superior L lenticular nucleus into mid to posterior L corona radiata.  Clinical Impression  Currently pt demonstrates impairments with R UE and R LE strength and limitations with functional mobility.  Prior to admission, pt was independent without AD.  Pt lives alone in 1 level home with 2 steps to enter (no railing).  Currently pt is supervision to CGA with transfers and ambulation without AD; pt with increased B lateral sway but no loss of balance noted (pt also reporting R LE feeling "heavy" with ambulation).  Pt would benefit from skilled PT to address above noted impairments and functional limitations.  Recommend pt discharge to home with OP PT and support of family when medically appropriate.     Follow Up Recommendations Outpatient PT    Equipment Recommendations  None recommended by PT    Recommendations for Other Services       Precautions / Restrictions Precautions Precautions: Fall Restrictions Weight Bearing Restrictions: No      Mobility  Bed Mobility Overal bed mobility: Independent                Transfers Overall transfer level: Needs assistance Equipment used: None Transfers: Sit to/from Stand;Stand Pivot Transfers Sit to Stand: Supervision Stand pivot transfers: Supervision       General transfer comment: steady without loss of balance  Ambulation/Gait Ambulation/Gait assistance: Supervision;Min guard Ambulation Distance (Feet): 140 Feet Assistive device: None Gait Pattern/deviations: Step-through pattern     General Gait Details: pt reporting R  LE feeling "heavy"; mild increased lateral sway B but no loss of balance noted; limited distance d/t pt reporting feeling "fuzzy" in her head (BP 177/73 sitting in chair post ambulation); good B heelstrike  Stairs            Wheelchair Mobility    Modified Rankin (Stroke Patients Only)       Balance Overall balance assessment: Needs assistance Sitting-balance support: No upper extremity supported;Feet supported Sitting balance-Leahy Scale: Normal     Standing balance support: No upper extremity supported Standing balance-Leahy Scale: Good                               Pertinent Vitals/Pain Pain Assessment: No/denies pain  Pre-ambulation pt's HR 54 bpm and O2 99% on room air. Post ambulation pt's HR 49 bpm (returned to 50-60 bpm with rest) and O2 99% on room air and BP 177/73 (nursing notified).    Home Living Family/patient expects to be discharged to:: Private residence Living Arrangements: Alone Available Help at Discharge: Family   Home Access: Stairs to enter Entrance Stairs-Rails: None Entrance Stairs-Number of Steps: 2 Home Layout: One level Home Equipment: None      Prior Function Level of Independence: Independent         Comments: Drives     Hand Dominance        Extremity/Trunk Assessment   Upper Extremity Assessment: RUE deficits/detail;LUE deficits/detail RUE Deficits / Details: R shoulder flexion 4/5; R elbow flexion 4+/5; R elbow extension 4+/5; R grip strength decreased compared to L  LUE Deficits / Details: WFL   Lower Extremity Assessment: RLE deficits/detail;LLE deficits/detail RLE Deficits / Details: R hip flexion 4-/5; R knee extension 4+/5; R knee flexion 4+/5; R DF 4+/5 (light touch intact; intact proprioception) LLE Deficits / Details: WFL  Cervical / Trunk Assessment: Normal  Communication   Communication: No difficulties  Cognition Arousal/Alertness: Awake/alert Behavior During Therapy: WFL for tasks  assessed/performed Overall Cognitive Status: Within Functional Limits for tasks assessed                      General Comments General comments (skin integrity, edema, etc.): R facial droop noted  Nursing cleared pt for participation in physical therapy.  Pt agreeable to PT session.    Exercises        Assessment/Plan    PT Assessment Patient needs continued PT services  PT Diagnosis Difficulty walking   PT Problem List Decreased strength;Decreased balance;Decreased mobility  PT Treatment Interventions DME instruction;Gait training;Stair training;Functional mobility training;Therapeutic activities;Therapeutic exercise;Balance training;Patient/family education   PT Goals (Current goals can be found in the Care Plan section) Acute Rehab PT Goals Patient Stated Goal: To go home PT Goal Formulation: With patient Time For Goal Achievement: 05/12/15 Potential to Achieve Goals: Good    Frequency 7X/week   Barriers to discharge        Co-evaluation               End of Session Equipment Utilized During Treatment: Gait belt Activity Tolerance: Other (comment) (Limited ambulation d/t pt feeling "fuzzy" in her head with ambulation) Patient left: in chair;with call bell/phone within reach;with family/visitor present Nurse Communication: Mobility status (BP and HR vitals)    Functional Assessment Tool Used: AM-PAC without stairs Functional Limitation: Mobility: Walking and moving around Mobility: Walking and Moving Around Current Status (W1191): At least 20 percent but less than 40 percent impaired, limited or restricted Mobility: Walking and Moving Around Goal Status [phone]): 0 percent impaired, limited or restricted    Time: 1110-1145 PT Time Calculation (min) (ACUTE ONLY): 35 min   Charges:   PT Evaluation $Initial PT Evaluation Tier I: 1 Procedure     PT G Codes:   PT G-Codes **NOT FOR INPATIENT CLASS** Functional Assessment Tool Used: AM-PAC without  stairs Functional Limitation: Mobility: Walking and moving around Mobility: Walking and Moving Around Current Status (F6213): At least 20 percent but less than 40 percent impaired, limited or restricted Mobility: Walking and Moving Around Goal Status [phone]): 0 percent impaired, limited or restricted    [facility] 04/28/2015, 1:34 PM Hendricks Limes, PT [phone]

## 2015-04-28 NOTE — Care Management (Signed)
Admitted to [facility] with the diagnosis of TIA. Lives alone. Son is Nhung Danko [phone]). No Life Alert, but requested information. Will give information on Alert monitoring offered by this facility. Seen Dr. Leim Fabry yesterday at [facility] in [place]. No Home Health. No skilled facility. Uses no aids for ambulation. Takes care of all basic and instrumental activities of living herself, still drives. Good appetite. Balance issues yesterday. Son will transport. Gwenette Greet RN MSN Care Management [phone]

## 2015-04-28 NOTE — Discharge Summary (Signed)
[facility] [place] [facility]   PATIENT NAME: Anne Herrera    MR#:  [id number]  DATE OF BIRTH:  [birth year]  DATE OF ADMISSION:  04/27/2015 ADMITTING PHYSICIAN: Arnaldo Natal, MD  DATE OF DISCHARGE: 04/28/2015 PRIMARY CARE PHYSICIAN: ALDRIDGE,BARBARA, MD    ADMISSION DIAGNOSIS:  CVA (cerebral infarction) [I63.9] Cerebral infarction due to unspecified mechanism [I63.9]  DISCHARGE DIAGNOSIS:  Active Problems:   TIA (transient ischemic attack)   SECONDARY DIAGNOSIS:   Past Medical History  Diagnosis Date  . Diabetes mellitus without complication   . Hypertension   . High cholesterol     HOSPITAL COURSE:  This is a very pleasant 79 year old female with diabetes and hypertension who presented with abnormal gait and weakness on the right side.  1. Acute nonhemorrhagic infarct extends from the posterior superior left lenticular nucleus into the mid to posterior left corona radiata: Patient underwent MRI and carotid Doppler. She was started on aspirin and continued on statin medication. She was seen and evaluated by physical therapy.  2. Essential hypertension: Patient will continue outpatient medications including losartan and Coreg.   3. Hyperlipidemia: Continue atorvastatin.  4. Diabetes: Patient is on metformin and glipizide at home which she will continue.  DISCHARGE CONDITIONS AND DIET:  Patient is being discharged home with home health on heart healthy diet in stable condition  CONSULTS OBTAINED:  Treatment Team:  Mellody Drown, MD  DRUG ALLERGIES:   Allergies  Allergen Reactions  . Amlodipine Hives and Itching  . Lisinopril Swelling    Reaction:  Mouth swelling    DISCHARGE MEDICATIONS:   Current Discharge Medication List    START taking these medications   Details  aspirin 325 MG tablet Take 1 tablet (325 mg total) by mouth daily. Qty: 120 tablet, Refills: 0      CONTINUE these medications which have NOT CHANGED    Details  atorvastatin (LIPITOR) 10 MG tablet Take 5 mg by mouth daily.    carvedilol (COREG) 25 MG tablet Take 25 mg by mouth 2 (two) times daily with a meal.    chlorthalidone (HYGROTON) 25 MG tablet Take 25 mg by mouth daily.    glipiZIDE (GLUCOTROL XL) 2.5 MG 24 hr tablet Take 2.5 mg by mouth daily.    losartan (COZAAR) 100 MG tablet Take 100 mg by mouth daily.    metFORMIN (GLUCOPHAGE-XR) 500 MG 24 hr tablet Take 1,000 mg by mouth daily.    potassium chloride (MICRO-K) 10 MEQ CR capsule Take 20 mEq by mouth daily.              Today   CHIEF COMPLAINT:  Patient reports improved symptoms.   VITAL SIGNS:  Blood pressure 157/62, pulse 71, temperature 98.1 F (36.7 C), temperature source Oral, resp. rate 18, height  (1.626 m), weight 85.73 kg (189 lb), SpO2 98 %.   REVIEW OF SYSTEMS:  Review of Systems  Constitutional: Negative for fever, chills and malaise/fatigue.  HENT: Negative for sore throat.   Eyes: Negative for blurred vision.  Respiratory: Negative for cough, hemoptysis, shortness of breath and wheezing.   Cardiovascular: Negative for chest pain, palpitations and leg swelling.  Gastrointestinal: Negative for nausea, vomiting, abdominal pain, diarrhea and blood in stool.  Genitourinary: Negative for dysuria.  Musculoskeletal: Negative for back pain.  Neurological: Positive for speech change and focal weakness. Negative for dizziness, tremors and headaches.  Endo/Heme/Allergies: Does not bruise/bleed easily.     PHYSICAL EXAMINATION:  GENERAL:  79 y.o.-year-old  patient lying in the bed with no acute distress.  NECK:  Supple, no jugular venous distention. No thyroid enlargement, no tenderness.  LUNGS: Normal breath sounds bilaterally, no wheezing, rales,rhonchi  No use of accessory muscles of respiration.  CARDIOVASCULAR: S1, S2 normal. No murmurs, rubs, or gallops.  ABDOMEN: Soft, non-tender, non-distended. Bowel sounds present. No organomegaly or  mass.  EXTREMITIES: No pedal edema, cyanosis, or clubbing.  PSYCHIATRIC: The patient is alert and oriented x 3.  SKIN: No obvious rash, lesion, or ulcer.  Neuro: Patient has slightly slurred speech with very minimal right lower extremity weakness areas sensation is intact. DATA REVIEW:   CBC  Recent Labs Lab 04/27/15 2120  WBC 7.8  HGB 10.4*  HCT 33.9*  PLT 306    Chemistries   Recent Labs Lab 04/27/15 2120  NA 138  K 3.2*  CL 101  CO2 27  GLUCOSE 144*  BUN 19  CREATININE 1.03*  CALCIUM 9.0    Cardiac Enzymes  Recent Labs Lab 04/27/15 2120  TROPONINI <0.03    Microbiology Results  @MICRORSLT48 @  RADIOLOGY:  Dg Chest 1 View  04/27/2015   CLINICAL DATA:  Dizziness  EXAM: CHEST  1 VIEW  COMPARISON:  None.  FINDINGS: The heart size and mediastinal contours are within normal limits. Both lungs are clear. The aorta is unfolded and ectatic. The visualized skeletal structures are unremarkable.  IMPRESSION: No active disease.   Electronically Signed   By: Christiana Pellant M.D.   On: 04/27/2015 22:02   Ct Head Wo Contrast  04/27/2015   CLINICAL DATA:  On blood pressure today, near syncope, dizzy  EXAM: CT HEAD WITHOUT CONTRAST  TECHNIQUE: Contiguous axial images were obtained from the base of the skull through the vertex without intravenous contrast.  COMPARISON:  None.  FINDINGS: Mild age-appropriate atrophy. No evidence of hemorrhage or infarct. No mass or hydrocephalus. Calvarium intact.  IMPRESSION: Head CT normal for age with mild atrophy.   Electronically Signed   By: Esperanza Heir M.D.   On: 04/27/2015 21:48   Mr Brain Wo Contrast  04/28/2015   CLINICAL DATA:  79 year old diabetic hypertensive female with high cholesterol presenting with lightheadedness originating 1 day ago. Leaning towards the right. Dragging right foot and slurred speech. Symptoms have improved. Subsequent encounter.  EXAM: MRI HEAD WITHOUT CONTRAST  TECHNIQUE: Multiplanar, multiecho pulse  sequences of the brain and surrounding structures were obtained without intravenous contrast.  COMPARISON:  04/27/2015 head CT.  FINDINGS: Acute nonhemorrhagic infarct extends from the posterior superior left lenticular nucleus into the mid to posterior left corona radiata.  No intracranial hemorrhage.  Minimal small vessel disease type changes.  No hydrocephalus.  No intracranial mass lesion noted on this unenhanced exam.  Major intracranial vascular structures are patent.  Minimal exophthalmos.  Cervical medullary junction, pituitary region and pineal region unremarkable.  Hyperostosis frontalis interna incidentally noted.  IMPRESSION: Acute nonhemorrhagic infarct extends from the posterior superior left lenticular nucleus into the mid to posterior left corona radiata.  These results will be called to the ordering clinician or representative by the Radiologist Assistant, and communication documented in the PACS or zVision Dashboard.   Electronically Signed   By: Lacy Duverney M.D.   On: 04/28/2015 10:20      Management plans discussed with the patient and she is in agreement. Stable for discharge home with home health  Patient should follow up with PCP in one week  CODE STATUS:     Code Status Orders  Start     Ordered   04/28/15 0222  Full code   Continuous     04/28/15 0221      TOTAL TIME TAKING CARE OF THIS PATIENT: 35 minutes.    Rolly Magri M.D on 04/28/2015 at 11:27 AM  Between 7am to 6pm - Pager - [phone] After 6pm go to [email or web address] - password [email or web address] [facility]  [facility]  Office  [phone]  CC: Primary care physician; Leim Fabry, MD

## 2015-05-05 ENCOUNTER — Encounter: Payer: Self-pay | Admitting: *Deleted

## 2015-05-05 NOTE — PMR Pre-admission (Signed)
[facility] PMR Admission Coordinator Pre-Admission Assessment  Patient: Anne Herrera is an 79 y.o., female MRN: [id number] DOB: Nov 15, 1935 Height: 5' 4"  (162.6 cm) Weight: 85.548 kg (188 lb 9.6 oz)  Insurance Information HMO:      PPO: Yes     PCP:       IPA:       80/20:       OTHER:  Group # [id number] PRIMARY: [employer]vantage      Policy#: [id number]      Subscriber: Jolee Ewing CM Name:  Claiborne Billings      Phone#: [phone]     Fax#: [phone] Pre-Cert#: [id number] with update after each team conference     Employer: Retired Benefits:  Phone #: [phone]     Name: Ida Rogue. Date: 08/29/14     Deduct:  $0      Out of Pocket Max: $4900 (met $87.66)      Life Max:  unlimited CIR: $275 days 1-7      SNF: $0 days 1-20; $160 days 21-100 Outpatient: medical necessity     Co-Pay: $15 copay Home Health: 100%      Co-Pay: none DME: 80%     Co-Pay: 20% Providers: in network  Medicaid Application Date:        Case Manager:   Disability Application Date:        Case Worker:    Emergency Facilities manager Information    Name Relation Home Work [place] Son [phone]        Current Medical History  Patient Admitting Diagnosis:  L posterior putamen/corona radiata infarct  History of Present Illness:  A 79 yo female admitted 04/29/15 to [facility] with HTN, HL OSA, anemia and recent stroke 04/27/15.  She was seen and treated at [facility] and discharged home 04/28/15 with plan for outpatient rehab.  She now presents to the [facility] ED as a code stroke.  The patient shares a story that she was alone in her home on Sunday when she developed leaning to her right side and mild weakness right side when she decided to call EMS as she was concerned that she was having a stroke.  She was admitted to [facility] and evaluated for stroke with an MRI that reportedly revealed a small left sided infarct.  She was discharged on optimum medical  therapy with plan for outpatient rehab to be initiated by her PCP.  However, in the evening of 04/28/15 she felt that she had a more noticeable weakness on her right with more pronounced facial droop, which her son also agreed with.  She eventually fell asleep.  Upon awakening at 3 am to use the restroom, she had a fall of which she did not hit her head or experience LOC.  She was then brought in to [facility] by EMS for further evaluations.  MRI on 05/01/15 showed infarct involving left posterior putamen and left posterior corona radiata had enlarged slightly with comparison to 04/29/15.  No hemorrhage.  Initially placed on Dysphagia 3 mechanical soft, thin liquids with upgrade by speech therapy on 05/05/15 to a regular diet with thin liqiuids.  PT/OT/ST evaluations were completed with therapies on going and recommendations for inpatient rehab admission.  Patient's medical record from [facility] has been reviewed by the rehabilitation admission coordinator and physician.  NIH Stroke scale: 5 on admission to [facility] on 04/29/15  Past Medical History  Past Medical History  Diagnosis  Date  . Diabetes mellitus without complication   . Hypertension   . High cholesterol   Additional Medical History:  Obstructive sleep apnea with CPAP at home, anemia, hyperlipidemia  Family History   family history includes CAD in her brother and mother; Hypertension in an other family member.  Prior Rehab/Hospitalizations Has the patient had major surgery during 100 days prior to admission? No   Current Medication:  See current MAR Aspirin, atorvastatin, lovenox, fluoxetine, lispro  Patients Current Diet:  Regular diet with thin liquids  Precautions / Restrictions Fall risk   Has the patient had 2 or more falls or a fall with injury in the past year?Yes.  Has had 2 controlled falls since 04/27/15 with no injury while going to the bathroom and while toileting.  Prior Activity Level Community (5-7x/wk):  Active.  Went out daily.  worked out at [employer].  Walked 1 1/2 - 2 miles a day.  Was driving.  Prior Functional Level Self Care: Did the patient need help bathing, dressing, using the toilet or eating?  Independent  Indoor Mobility: Did the patient need assistance with walking from room to room (with or without device)? Independent  Stairs: Did the patient need assistance with internal or external stairs (with or without device)? Independent  Functional Cognition: Did the patient need help planning regular tasks such as shopping or remembering to take medications? Independent  Home Assistive Devices / Equipment Home Assistive Devices/Equipment: CPAP Home Equipment: None  Prior Device Use: Indicate devices/aids used by the patient prior to current illness, exacerbation or injury? None   Prior Functional Level Current Functional Level  Bed Mobility  Independent  Minimum assist  Transfers  Independent  Min assist  Mobility - Walk/Wheelchair  Independent  Mod assist 35 ft L wall rail with right foot drag  Upper Body Dressing  Independent  Mod assist  Lower Body Dressing  Independent  Min assist  Grooming  Independent  Set up to min assist  Eating/Drinking  Independent  Set up  Toilet Transfer  Independent  Min assist  Bladder Continence   WDL  Ambulating to bathroom with assist  Bowel Management  WDL, Daily BM usually  Last BM 05/03/15, usually BM daily  Stair Climbing  Independent  Not tested yet  Communication  WDL  Mild dysarthria  Memory  Mild memory impairment prior to CVA  Mild memory inpairment  Cooking/Meal Prep  Independent     Housework  Independent   Money Management  Independent   Driving  Yes, driving independently     Special needs/care consideration BiPAP/CPAP Yes, CPAP for sleep apnea CPM No Continuous Drip IV No Dialysis No         Life Vest No Oxygen No Special Bed No Trach Size No Wound Vac (area) No      Skin Has eczema                               Bowel mgmt: Last BM 05/03/15 Bladder mgmt: Ambulating to bathroom with assistance Diabetic mgmt Yes, on oral medications at home  Previous Home Environment Living Arrangements: Alone  Lives With: Alone Available Help at Discharge: Family Type of Home: House Home Layout: One level Home Access: Stairs to enter Entrance Stairs-Rails: None Entrance Stairs-Number of Steps: 1-2 steps entry [place]: No  Discharge Living Setting Plans for Discharge Living Setting: Patient's home, Alone, House (Lived alone, but family plan to  assist after discharge.) Type of Home at Discharge: House Discharge Home Layout: One level Discharge Home Access: Stairs to enter Entrance Stairs-Number of Steps: 1-2 step  Social/Family/Support Systems Patient Roles: Parent (Has a son, daughter and extended family.) Contact Information: Nikki Glanzer - son Anticipated Caregiver: son and other family Anticipated Caregiver's Contact Information: Ulice Dash - son [phone] Ability/Limitations of Caregiver: Son works.  He tells me there are family available to help after rehab discharge Caregiver Availability: Other (Comment) (Will have close to 24 hr supervision per son.) Discharge Plan Discussed with Primary Caregiver: Yes Is Caregiver In Agreement with Plan?: Yes Does Caregiver/Family have Issues with Lodging/Transportation while Pt is in Rehab?: No  Goals/Additional Needs Patient/Family Goal for Rehab: PT/OT supervision to mod I goals, ST mod I goals Expected length of stay: 7-10 days Cultural Considerations: Enjoys attending church Dietary Needs: Regular diet, thin liquids Equipment Needs: TBD Pt/Family Agrees to Admission and willing to participate: Yes Program Orientation Provided & Reviewed with Pt/Caregiver Including Roles  & Responsibilities: Yes  Patient Condition: I have reviewed all notes from [facility] and from [facility].  I have talked to patient's son.  Patient is very motivated and is  participating well with therapies.  Patient needs a coordinated approach to her rehab and needs PT/OT/ST ongoing sessions.  I have shared all of my findings and have reviewed all with rehab MD.  Patient can benefit greatly from acute inpatient rehab admission. Patient is currently requiring min to mod assist for mobility and ADLs.   I have approval from rehab MD for acute inpatient rehab admission.  Preadmission Screen Completed By:  Retta Diones, 05/05/2015 1:51 PM ______________________________________________________________________   Discussed status with Dr. Naaman Plummer  on  05/06/15 at [phone] and received telephone approval for admission today.  Admission Coordinator:  Retta Diones, time 0852/Date 05/06/15   Assessment/Plan: Diagnosis: left putamen/CR infarct 1. Does the need for close, 24 hr/day  Medical supervision in concert with the patient's rehab needs make it unreasonable for this patient to be served in a less intensive setting? Yes 2. Co-Morbidities requiring supervision/potential complications: post stroke sequelae, htn, dm 3. Due to bladder management, bowel management, safety, skin/wound care, disease management, medication administration, pain management and patient education, does the patient require 24 hr/day rehab nursing? Yes 4. Does the patient require coordinated care of a physician, rehab nurse, PT (1-2 hrs/day, 5 days/week), OT (1-2 hrs/day, 5 days/week) and SLP (1-2 hrs/day, 5 days/week) to address physical and functional deficits in the context of the above medical diagnosis(es)? Yes Addressing deficits in the following areas: balance, endurance, locomotion, strength, transferring, bowel/bladder control, bathing, dressing, feeding, grooming, toileting, cognition, speech and psychosocial support 5. Can the patient actively participate in an intensive therapy program of at least 3 hrs of therapy 5 days a week? Yes 6. The potential for patient to make measurable gains while  on inpatient rehab is excellent 7. Anticipated functional outcomes upon discharge from inpatients are: modified independent and supervision PT, modified independent and supervision OT, modified independent SLP 8. Estimated rehab length of stay to reach the above functional goals is: 7-10 days 9. Does the patient have adequate social supports to accommodate these discharge functional goals? Yes 10. Anticipated D/C setting: Home 11. Anticipated post D/C treatments: HH therapy and Outpatient therapy 12. Overall Rehab/Functional Prognosis: excellent    RECOMMENDATIONS:  This patient's condition is appropriate for continued rehabilitative care in the following setting: CIR Patient has agreed to participate in recommended program. Yes Note that  insurance prior authorization may be required for reimbursement for recommended care.  Comment: Admit to CIR today  Meredith Staggers, MD, [place] [facility] 05/06/2015   Retta Diones 05/05/2015

## 2015-05-06 ENCOUNTER — Encounter (HOSPITAL_COMMUNITY): Payer: Self-pay | Admitting: Physical Medicine and Rehabilitation

## 2015-05-06 ENCOUNTER — Inpatient Hospital Stay (HOSPITAL_COMMUNITY)
Admission: RE | Admit: 2015-05-06 | Discharge: 2015-05-22 | DRG: 065 | Disposition: A | Discharge status: Skilled Nursing Facility | Payer: Medicare PPO | Source: Other Acute Inpatient Hospital | Attending: Physical Medicine & Rehabilitation | Admitting: Physical Medicine & Rehabilitation

## 2015-05-06 DIAGNOSIS — G8191 Hemiplegia, unspecified affecting right dominant side: Secondary | ICD-10-CM | POA: Diagnosis present

## 2015-05-06 DIAGNOSIS — E876 Hypokalemia: Secondary | ICD-10-CM | POA: Diagnosis present

## 2015-05-06 DIAGNOSIS — R471 Dysarthria and anarthria: Secondary | ICD-10-CM | POA: Diagnosis present

## 2015-05-06 DIAGNOSIS — I6350 Cerebral infarction due to unspecified occlusion or stenosis of unspecified cerebral artery: Secondary | ICD-10-CM

## 2015-05-06 DIAGNOSIS — G8111 Spastic hemiplegia affecting right dominant side: Secondary | ICD-10-CM | POA: Diagnosis present

## 2015-05-06 DIAGNOSIS — I10 Essential (primary) hypertension: Secondary | ICD-10-CM | POA: Insufficient documentation

## 2015-05-06 DIAGNOSIS — M21371 Foot drop, right foot: Secondary | ICD-10-CM | POA: Diagnosis present

## 2015-05-06 DIAGNOSIS — L409 Psoriasis, unspecified: Secondary | ICD-10-CM | POA: Diagnosis present

## 2015-05-06 DIAGNOSIS — E119 Type 2 diabetes mellitus without complications: Secondary | ICD-10-CM | POA: Diagnosis not present

## 2015-05-06 DIAGNOSIS — E11649 Type 2 diabetes mellitus with hypoglycemia without coma: Secondary | ICD-10-CM | POA: Diagnosis not present

## 2015-05-06 DIAGNOSIS — I639 Cerebral infarction, unspecified: Secondary | ICD-10-CM | POA: Diagnosis present

## 2015-05-06 DIAGNOSIS — E871 Hypo-osmolality and hyponatremia: Secondary | ICD-10-CM | POA: Diagnosis present

## 2015-05-06 DIAGNOSIS — N319 Neuromuscular dysfunction of bladder, unspecified: Secondary | ICD-10-CM | POA: Diagnosis present

## 2015-05-06 DIAGNOSIS — M21379 Foot drop, unspecified foot: Secondary | ICD-10-CM | POA: Diagnosis present

## 2015-05-06 DIAGNOSIS — G8110 Spastic hemiplegia affecting unspecified side: Secondary | ICD-10-CM | POA: Diagnosis not present

## 2015-05-06 DIAGNOSIS — I6381 Other cerebral infarction due to occlusion or stenosis of small artery: Secondary | ICD-10-CM | POA: Diagnosis present

## 2015-05-06 DIAGNOSIS — I69322 Dysarthria following cerebral infarction: Secondary | ICD-10-CM | POA: Diagnosis not present

## 2015-05-06 DIAGNOSIS — M109 Gout, unspecified: Secondary | ICD-10-CM | POA: Diagnosis present

## 2015-05-06 DIAGNOSIS — R197 Diarrhea, unspecified: Secondary | ICD-10-CM | POA: Diagnosis not present

## 2015-05-06 HISTORY — DX: Gout, unspecified: M10.9

## 2015-05-06 LAB — GLUCOSE, CAPILLARY
GLUCOSE-CAPILLARY: 170 mg/dL — AB (ref 65–99)
Glucose-Capillary: 133 mg/dL — ABNORMAL HIGH (ref 65–99)

## 2015-05-06 MED ORDER — FLEET ENEMA 7-19 GM/118ML RE ENEM: 1.0000 | ENEMA | Freq: Once | RECTAL | Status: DC | PRN

## 2015-05-06 MED ORDER — PROCHLORPERAZINE MALEATE 5 MG PO TABS
5.0000 mg | ORAL_TABLET | Freq: Four times a day (QID) | ORAL | Status: DC | PRN
  Administered 2015-05-12 – 2015-05-19 (×3): 5 mg via ORAL
  Filled 2015-05-06: qty 1
  Filled 2015-05-06: qty 2
  Filled 2015-05-06: qty 1

## 2015-05-06 MED ORDER — CARVEDILOL 25 MG PO TABS
25.0000 mg | ORAL_TABLET | Freq: Two times a day (BID) | ORAL | Status: DC
  Administered 2015-05-06 – 2015-05-07 (×2): 25 mg via ORAL
  Filled 2015-05-06 (×3): qty 1

## 2015-05-06 MED ORDER — TRAZODONE HCL 50 MG PO TABS
25.0000 mg | ORAL_TABLET | Freq: Every evening | ORAL | Status: DC | PRN
Start: 2015-05-06 — End: 2015-05-22
  Administered 2015-05-15: 50 mg via ORAL
  Administered 2015-05-20: 25 mg via ORAL
  Filled 2015-05-06 (×3): qty 1

## 2015-05-06 MED ORDER — BISACODYL 10 MG RE SUPP: 10.0000 mg | Freq: Every day | RECTAL | Status: DC | PRN

## 2015-05-06 MED ORDER — METHOCARBAMOL 500 MG PO TABS: 500.0000 mg | ORAL_TABLET | Freq: Four times a day (QID) | ORAL | Status: DC | PRN

## 2015-05-06 MED ORDER — SENNOSIDES-DOCUSATE SODIUM 8.6-50 MG PO TABS: 1.0000 | ORAL_TABLET | Freq: Every evening | ORAL | Status: DC | PRN

## 2015-05-06 MED ORDER — ASPIRIN EC 81 MG PO TBEC
81.0000 mg | DELAYED_RELEASE_TABLET | Freq: Every day | ORAL | Status: DC
  Administered 2015-05-07 – 2015-05-22 (×16): 81 mg via ORAL
  Filled 2015-05-06 (×16): qty 1

## 2015-05-06 MED ORDER — DIPHENHYDRAMINE HCL 12.5 MG/5ML PO ELIX
12.5000 mg | ORAL_SOLUTION | Freq: Four times a day (QID) | ORAL | Status: DC | PRN
Start: 2015-05-06 — End: 2015-05-22

## 2015-05-06 MED ORDER — ENOXAPARIN SODIUM 40 MG/0.4ML ~~LOC~~ SOLN
40.0000 mg | SUBCUTANEOUS | Status: DC
  Administered 2015-05-06 – 2015-05-21 (×16): 40 mg via SUBCUTANEOUS
  Filled 2015-05-06 (×17): qty 0.4

## 2015-05-06 MED ORDER — INSULIN ASPART 100 UNIT/ML ~~LOC~~ SOLN
0.0000 [IU] | Freq: Three times a day (TID) | SUBCUTANEOUS | Status: DC
  Administered 2015-05-06 – 2015-05-13 (×8): 2 [IU] via SUBCUTANEOUS
  Administered 2015-05-14: 3 [IU] via SUBCUTANEOUS
  Administered 2015-05-15 – 2015-05-18 (×3): 2 [IU] via SUBCUTANEOUS

## 2015-05-06 MED ORDER — METFORMIN HCL ER 500 MG PO TB24
1000.0000 mg | ORAL_TABLET | Freq: Every day | ORAL | Status: DC
  Administered 2015-05-07 – 2015-05-20 (×14): 1000 mg via ORAL
  Filled 2015-05-06 (×17): qty 2

## 2015-05-06 MED ORDER — GUAIFENESIN-DM 100-10 MG/5ML PO SYRP
5.0000 mL | ORAL_SOLUTION | Freq: Four times a day (QID) | ORAL | Status: DC | PRN
Start: 2015-05-06 — End: 2015-05-22

## 2015-05-06 MED ORDER — FLUOXETINE HCL 20 MG PO CAPS
20.0000 mg | ORAL_CAPSULE | Freq: Every day | ORAL | Status: DC
  Administered 2015-05-07 – 2015-05-22 (×16): 20 mg via ORAL
  Filled 2015-05-06 (×16): qty 1

## 2015-05-06 MED ORDER — INSULIN ASPART 100 UNIT/ML ~~LOC~~ SOLN: 0.0000 [IU] | Freq: Every day | SUBCUTANEOUS | Status: DC

## 2015-05-06 MED ORDER — ACETAMINOPHEN 325 MG PO TABS
325.0000 mg | ORAL_TABLET | ORAL | Status: DC | PRN
  Administered 2015-05-14: 650 mg via ORAL
  Filled 2015-05-06: qty 2

## 2015-05-06 MED ORDER — PROCHLORPERAZINE 25 MG RE SUPP: 12.5000 mg | Freq: Four times a day (QID) | RECTAL | Status: DC | PRN

## 2015-05-06 MED ORDER — PROCHLORPERAZINE EDISYLATE 5 MG/ML IJ SOLN: 5.0000 mg | Freq: Four times a day (QID) | INTRAMUSCULAR | Status: DC | PRN

## 2015-05-06 MED ORDER — GLIPIZIDE ER 2.5 MG PO TB24
2.5000 mg | ORAL_TABLET | Freq: Every day | ORAL | Status: DC
  Administered 2015-05-07 – 2015-05-16 (×10): 2.5 mg via ORAL
  Filled 2015-05-06 (×14): qty 1

## 2015-05-06 MED ORDER — ALUM & MAG HYDROXIDE-SIMETH 200-200-20 MG/5ML PO SUSP: 30.0000 mL | ORAL | Status: DC | PRN

## 2015-05-06 MED ORDER — ATORVASTATIN CALCIUM 80 MG PO TABS
80.0000 mg | ORAL_TABLET | Freq: Every day | ORAL | Status: DC
  Administered 2015-05-06 – 2015-05-21 (×16): 80 mg via ORAL
  Filled 2015-05-06 (×16): qty 1

## 2015-05-06 NOTE — Progress Notes (Signed)
Retta Diones, RN Rehab Admission Coordinator Signed Physical Medicine and Rehabilitation PMR Pre-admission 05/05/2015 11:49 AM  Related encounter: Documentation from 05/05/2015 in [place] Collapse All     Secondary Market PMR Admission Coordinator Pre-Admission Assessment  Patient: Anne Herrera is an 79 y.o., female MRN: [id number] DOB: 1936/01/09 Height: _0  (162.6 cm) Weight: 85.548 kg (188 lb 9.6 oz)  Insurance Information HMO: PPO: Yes PCP: IPA: 80/20: OTHER: Group # [id number] PRIMARY: [employer] Medicare Advantage Policy#: [id number] Subscriber: Jolee Ewing CM Name: Claiborne Billings Phone#: [phone] X [phone] Fax#: [phone] Pre-Cert#: [id number] with update after each team conference Employer: Retired Benefits: Phone #: [phone] Name: Ida Rogue. Date: 08/29/14 Deduct: $0 Out of Pocket Max: $4900 (met $87.66) Life Max: unlimited CIR: $275 days 1-7 SNF: $0 days 1-20; $160 days 21-100 Outpatient: medical necessity Co-Pay: $15 copay Home Health: 100% Co-Pay: none DME: 80% Co-Pay: 20% Providers: in network  Medicaid Application Date: Case Manager:  Disability Application Date: Case Worker:   Emergency Facilities manager Information    Name Relation Home Work [place] Son [phone]        Current Medical History  Patient Admitting Diagnosis: L posterior putamen/corona radiata infarct  History of Present Illness: A 79 yo female admitted 04/29/15 to [facility] with HTN, HL OSA, anemia and recent stroke 04/27/15. She was seen and treated at [facility] and discharged home 04/28/15 with plan for outpatient rehab. She now presents to the [facility] ED as a code stroke. The patient shares a story that she was alone in her home on Sunday when she developed leaning to her right side and mild weakness  right side when she decided to call EMS as she was concerned that she was having a stroke. She was admitted to [facility] and evaluated for stroke with an MRI that reportedly revealed a small left sided infarct. She was discharged on optimum medical therapy with plan for outpatient rehab to be initiated by her PCP. However, in the evening of 04/28/15 she felt that she had a more noticeable weakness on her right with more pronounced facial droop, which her son also agreed with. She eventually fell asleep. Upon awakening at 3 am to use the restroom, she had a fall of which she did not hit her head or experience LOC. She was then brought in to [facility] by EMS for further evaluations. MRI on 05/01/15 showed infarct involving left posterior putamen and left posterior corona radiata had enlarged slightly with comparison to 04/29/15. No hemorrhage. Initially placed on Dysphagia 3 mechanical soft, thin liquids with upgrade by speech therapy on 05/05/15 to a regular diet with thin liqiuids. PT/OT/ST evaluations were completed with therapies on going and recommendations for inpatient rehab admission.  Patient's medical record from [facility] has been reviewed by the rehabilitation admission coordinator and physician.  NIH Stroke scale: 5 on admission to [facility] on 04/29/15  Past Medical History  Past Medical History  Diagnosis Date  . Diabetes mellitus without complication   . Hypertension   . High cholesterol   Additional Medical History: Obstructive sleep apnea with CPAP at home, anemia, hyperlipidemia  Family History  family history includes CAD in her brother and mother; Hypertension in an other family member.  Prior Rehab/Hospitalizations Has the patient had major surgery during 100 days prior to admission? No  Current Medication: See current MAR Aspirin, atorvastatin, lovenox, fluoxetine, lispro  Patients Current Diet: Regular diet with thin  liquids  Precautions / Restrictions Fall risk   Has the patient had 2 or more falls or a fall with injury in the past year?Yes. Has had 2 controlled falls since 04/27/15 with no injury while going to the bathroom and while toileting.  Prior Activity Level Community (5-7x/wk): Active. Went out daily. worked out at [employer]. Walked 1 1/2 - 2 miles a day. Was driving.  Prior Functional Level Self Care: Did the patient need help bathing, dressing, using the toilet or eating? Independent  Indoor Mobility: Did the patient need assistance with walking from room to room (with or without device)? Independent  Stairs: Did the patient need assistance with internal or external stairs (with or without device)? Independent  Functional Cognition: Did the patient need help planning regular tasks such as shopping or remembering to take medications? Independent  Home Assistive Devices / Equipment Home Assistive Devices/Equipment: CPAP Home Equipment: None  Prior Device Use: Indicate devices/aids used by the patient prior to current illness, exacerbation or injury? None   Prior Functional Level Current Functional Level  Bed Mobility  Independent  Minimum assist  Transfers  Independent  Min assist  Mobility - Walk/Wheelchair  Independent  Mod assist 56 ft L wall rail with right foot drag  Upper Body Dressing  Independent  Mod assist  Lower Body Dressing  Independent  Min assist  Grooming  Independent  Set up to min assist  Eating/Drinking  Independent  Set up  Toilet Transfer  Independent  Min assist  Bladder Continence   WDL  Ambulating to bathroom with assist  Bowel Management  WDL, Daily BM usually  Last BM 05/03/15, usually BM daily  Stair Climbing  Independent  Not tested yet  Communication  WDL  Mild dysarthria  Memory  Mild memory impairment prior to CVA  Mild memory inpairment  Cooking/Meal Prep  Independent     Housework  Independent   Money Management  Independent   Driving  Yes, driving independently     Special needs/care consideration BiPAP/CPAP Yes, CPAP for sleep apnea CPM No Continuous Drip IV No Dialysis No  Life Vest No Oxygen No Special Bed No Trach Size No Wound Vac (area) No  Skin Has eczema  Bowel mgmt: Last BM 05/03/15 Bladder mgmt: Ambulating to bathroom with assistance Diabetic mgmt Yes, on oral medications at home  Previous Home Environment Living Arrangements: Alone Lives With: Alone Available Help at Discharge: Family Type of Home: House Home Layout: One level Home Access: Stairs to enter Entrance Stairs-Rails: None Entrance Stairs-Number of Steps: 1-2 steps entry [place]: No  Discharge Living Setting Plans for Discharge Living Setting: Patient's home, Alone, House (Lived alone, but family plan to assist after discharge.) Type of Home at Discharge: House Discharge Home Layout: One level Discharge Home Access: Stairs to enter Entrance Stairs-Number of Steps: 1-2 step  Social/Family/Support Systems Patient Roles: Parent (Has a son, daughter and extended family.) Contact Information: Conswella Bruney - son Anticipated Caregiver: son and other family Anticipated Caregiver's Contact Information: Ulice Dash - son [phone] Ability/Limitations of Caregiver: Son works. He tells me there are family available to help after rehab discharge Caregiver Availability: Other (Comment) (Will have close to 24 hr supervision per son.) Discharge Plan Discussed with Primary Caregiver: Yes Is Caregiver In Agreement with Plan?: Yes Does Caregiver/Family have Issues with Lodging/Transportation while Pt is in Rehab?: No  Goals/Additional Needs Patient/Family Goal for Rehab: PT/OT supervision to mod I goals, ST mod I goals Expected length of stay: 7-10  days Cultural Considerations: Enjoys attending church Dietary Needs:  Regular diet, thin liquids Equipment Needs: TBD Pt/Family Agrees to Admission and willing to participate: Yes Program Orientation Provided & Reviewed with Pt/Caregiver Including Roles & Responsibilities: Yes  Patient Condition: I have reviewed all notes from [facility] and from [facility]. I have talked to patient's son. Patient is very motivated and is participating well with therapies. Patient needs a coordinated approach to her rehab and needs PT/OT/ST ongoing sessions. I have shared all of my findings and have reviewed all with rehab MD. Patient can benefit greatly from acute inpatient rehab admission. Patient is currently requiring min to mod assist for mobility and ADLs. I have approval from rehab MD for acute inpatient rehab admission.  Preadmission Screen Completed By: Retta Diones, 05/05/2015 1:51 PM ______________________________________________________________________  Discussed status with Dr. Naaman Plummer on 05/06/15 at [phone] and received telephone approval for admission today.  Admission Coordinator: Retta Diones, time 0852/Date 05/06/15   Assessment/Plan: Diagnosis: left putamen/CR infarct 1. Does the need for close, 24 hr/day Medical supervision in concert with the patient's rehab needs make it unreasonable for this patient to be served in a less intensive setting? Yes 2. Co-Morbidities requiring supervision/potential complications: post stroke sequelae, htn, dm 3. Due to bladder management, bowel management, safety, skin/wound care, disease management, medication administration, pain management and patient education, does the patient require 24 hr/day rehab nursing? Yes 4. Does the patient require coordinated care of a physician, rehab nurse, PT (1-2 hrs/day, 5 days/week), OT (1-2 hrs/day, 5 days/week) and SLP (1-2 hrs/day, 5 days/week) to address physical and functional deficits in the context of the above medical diagnosis(es)? Yes Addressing deficits in the following  areas: balance, endurance, locomotion, strength, transferring, bowel/bladder control, bathing, dressing, feeding, grooming, toileting, cognition, speech and psychosocial support 5. Can the patient actively participate in an intensive therapy program of at least 3 hrs of therapy 5 days a week? Yes 6. The potential for patient to make measurable gains while on inpatient rehab is excellent 7. Anticipated functional outcomes upon discharge from inpatients are: modified independent and supervision PT, modified independent and supervision OT, modified independent SLP 8. Estimated rehab length of stay to reach the above functional goals is: 7-10 days 9. Does the patient have adequate social supports to accommodate these discharge functional goals? Yes 10. Anticipated D/C setting: Home 11. Anticipated post D/C treatments: HH therapy and Outpatient therapy 12. Overall Rehab/Functional Prognosis: excellent    RECOMMENDATIONS:  This patient's condition is appropriate for continued rehabilitative care in the following setting: CIR Patient has agreed to participate in recommended program. Yes Note that insurance prior authorization may be required for reimbursement for recommended care.  Comment: Admit to CIR today  Meredith Staggers, MD, [facility] 05/06/2015   Retta Diones 05/05/2015      Cosigned by: Meredith Staggers, MD at 05/06/2015 9:23 AM  Revision History     Date/Time User Provider Type Action   05/06/2015 9:23 AM Meredith Staggers, MD Physician Cosign   05/06/2015 9:23 AM Meredith Staggers, MD Physician Sign   05/06/2015 8:53 AM Retta Diones, RN Rehab Admission Coordinator Share   View Details Report

## 2015-05-06 NOTE — Progress Notes (Signed)
Patient placed on CPAP auto and . No O2 bleed in required. Patient tolerating well. Sat 98%. Rt will continue to monitor as needed.

## 2015-05-06 NOTE — Progress Notes (Signed)
Patient ID: Anne Herrera, female   DOB: [birth year], 79 y.o.   MRN: [id number] Patient admitted to 4M09 via stretcher, escorted by transport staff from [place].  Patient verbalized understanding of rehab process, signed fall safety agreement.  Patient is wearing a temporary cardiac monitor and will instruct staff on any needs she may have.  Equipment at bedside.  Appears to be in no immediate distress at this time.  Dani Gobble, RN

## 2015-05-06 NOTE — H&P (Signed)
Physical Medicine and Rehabilitation Admission H&P    CC: Right sided weakness and slurred speech  HPI:  Anne Herrera is a 79 y.o. Left handed female with history of HTN, DM type 2, OSA, left lacunar infarct 04/29/15 who was discharged to home on 9/01 but readmitted to [facility] on 09/02 with slurred speech, inability to use RUE as well as RLE weakness with difficulty walking.  MRI brain done revealing slightly enlarged left posterior putamen and left posterior CR infarct. TEE without evidence of thrombus and 30 day event monitor was placed.  She was maintained on ASA 81 mg daily for secondary stroke prevention and permissive hypertension to help with perfusion. She has had improvement in right sided weakness and dysarthria.  Speech therapy has been working oropharyngeal dysphagia and diet has been advanced to regular textures.  Therapy ongoing and CIR recommended by MD and Rehab team.     Review of Systems  Constitutional: Negative for malaise/fatigue.  HENT: Negative for hearing loss.   Eyes: Negative for blurred vision and pain.  Respiratory: Negative for cough, shortness of breath and wheezing.   Cardiovascular: Negative for chest pain, palpitations and leg swelling.  Gastrointestinal: Negative for heartburn, nausea, abdominal pain and constipation.  Genitourinary: Positive for urgency and frequency. Negative for dysuria.       Gets up 3 x at nights.   Musculoskeletal: Negative for myalgias, joint pain and neck pain.  Neurological: Positive for dizziness (with positional changes. ), sensory change, speech change and focal weakness. Negative for headaches.  Psychiatric/Behavioral: Negative for depression. The patient is not nervous/anxious and does not have insomnia.       Past Medical History  Diagnosis Date  . Diabetes mellitus without complication   . Hypertension   . High cholesterol     Past Surgical History  Procedure Laterality Date  . Tubal ligation      Family  History  Problem Relation Age of Onset  . Hypertension      all family members  . CAD Mother   . CAD Brother     Social History:  Lives alone. 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 Family supportive and can provide assistance after discharge.  Independent and active PTA. Volunteers couple times a week and takes older adult aerobics classes 3 X week. She reports that she quit smoking in the 70's.  She has never used smokeless tobacco. She reports that she does not drink alcohol or use illicit drugs.   Allergies  Allergen Reactions  . Amlodipine Hives and Itching  . Lisinopril Swelling    Reaction:  Mouth swelling  . Sulfa Antibiotics     Medications Prior to Admission  Medication Sig Dispense Refill  . aspirin 325 MG tablet Take 1 tablet (325 mg total) by mouth daily. 120 tablet 0  . atorvastatin (LIPITOR) 10 MG tablet Take 5 mg by mouth daily.    . carvedilol (COREG) 25 MG tablet Take 25 mg by mouth 2 (two) times daily with a meal.    . chlorthalidone (HYGROTON) 25 MG tablet Take 25 mg by mouth daily.    Marland Kitchen glipiZIDE (GLUCOTROL XL) 2.5 MG 24 hr tablet Take 2.5 mg by mouth daily.    Marland Kitchen losartan (COZAAR) 100 MG tablet Take 100 mg by mouth daily.    . metFORMIN (GLUCOPHAGE-XR) 500 MG 24 hr tablet Take 1,000 mg by mouth daily.    . potassium chloride (MICRO-K) 10 MEQ CR capsule Take 20 mEq by mouth daily.  Home: One level home with 1-2 STE.   Functional History: Independent and active PTA.  Functional Status:  Mobility: Min assist for bed mobility Min assist for transfers Min assist to ambulate 30' with wall rail and for 50' with hemi walker +2 to advance RLE     ADL: Moderate assist for bathing Moderate assist for UB dressing. Mod assist for LB dressing.   Cognition: Able to state and maintain swallow strategies. MOCA score 21/30   There were no vitals taken for this visit. Physical Exam  Nursing note and vitals reviewed. Constitutional: She is oriented to  person, place, and time. She appears well-developed and well-nourished.  HENT:  Head: Normocephalic and atraumatic.  Mouth/Throat: Oropharynx is clear and moist.  Eyes: Conjunctivae are normal. Pupils are equal, round, and reactive to light. Right eye exhibits no discharge.  Neck: Normal range of motion. Neck supple.  Cardiovascular: Normal rate and regular rhythm.   Respiratory: Effort normal and breath sounds normal. She has no wheezes. She exhibits no tenderness.  GI: Soft. Bowel sounds are normal. She exhibits no distension. There is no tenderness.  Musculoskeletal: She exhibits no edema or tenderness.  Bilateral knees and upper shin with hypergranulation tissue due to fall years ago.   Neurological: She is alert and oriented to person, place, and time.  Right central VII with Mild dysarthria. Able to follow one and two steps commands without difficulty.  RUE: 1+ to 2- deltoid, biceps, triceps, 1 wrist, 1+ HI. RLE: 2/5 HF, 2-KE and 1 to 1+ ADF/APF. Decreased LT and PP right side comparatively to left but can sense both. DTR's 1+ bilaterally. Cognitively displays good insight and awareness.   Skin: Skin is warm and dry. No rash noted. No erythema.  Psychiatric: She has a normal mood and affect. Her behavior is normal. Judgment and thought content normal.    Pertinent Labs:  9/06  BMET: Na- 136  K- 3.7  CL-100  CO2- 27  BUN- 15  Cr- 0.9  Gluc- 146 9/05  CBC:   Hgb- 11.4  HCT- 37.1  PLT-309  WBC- 6.7 Hgb A1C- 7.2 Chol-170  LDL- 101    HDL- 48  Trig- 107   2D echo  EF > 55% with mild LVH, grade 1 diastolic dysfunction,  Trivial AR, trivial MR.     No results found.   Medical Problem List and Plan: 1. Functional deficits secondary to Left putamen/Corona radiata infarct with extension.   -ASA for stroke prophylaxis  -removable event monitor in place 2.  DVT Prophylaxis/Anticoagulation: Pharmaceutical: Lovenox 3. Pain Management: Tylenol prn pain. 4. Mood: LCSW to follow for  evaluation and support.  5. Neuropsych: This patient is capable of making decisions on her own behalf. 6. Skin/Wound Care: Routine pressure relief measures.  7. Fluids/Electrolytes/Nutrition: Monitor I/O.  Check lytes in am.  Offer supplements if intake poor.  8.  HTN:  Monitor bid. Continue coreg bid. Off HCTZ at this time.  9. DM type 2: Monitor BS ac/hs. Continue glipizide and metformin.  Use SSI for elevated BS and adjust medications as indicated.  10. H/o Gout: Controlled off medications.      Post Admission Physician Evaluation: 1. Functional deficits secondary  to Left putamen/Corona radiata infarct with extension.  2. Patient is admitted to receive collaborative, interdisciplinary care between the physiatrist, rehab nursing staff, and therapy team. 3. Patient's level of medical complexity and substantial therapy needs in context of that medical necessity cannot be provided at a lesser intensity of  care such as a SNF. 4. Patient has experienced substantial functional loss from his/her baseline which was documented above under the "Functional History" and "Functional Status" headings.  Judging by the patient's diagnosis, physical exam, and functional history, the patient has potential for functional progress which will result in measurable gains while on inpatient rehab.  These gains will be of substantial and practical use upon discharge  in facilitating mobility and self-care at the household level. 5. Physiatrist will provide 24 hour management of medical needs as well as oversight of the therapy plan/treatment and provide guidance as appropriate regarding the interaction of the two. 6. 24 hour rehab nursing will assist with bladder management, bowel management, safety, skin/wound care, disease management, medication administration, pain management and patient education  and help integrate therapy concepts, techniques,education, etc. 7. PT will assess and treat for/with: Lower extremity  strength, range of motion, stamina, balance, functional mobility, safety, adaptive techniques and equipment, NMR, stroke education, community reintegration.   Goals are: mod I. 8. OT will assess and treat for/with: ADL's, functional mobility, safety, upper extremity strength, adaptive techniques and equipment, NMR, stroke education, ego support, community reintegration.   Goals are: mod I. Therapy may proceed with showering this patient. 9. SLP will assess and treat for/with: n/a.  Goals are: n/a. 10. Case Management and Social Worker will assess and treat for psychological issues and discharge planning. 11. Team conference will be held weekly to assess progress toward goals and to determine barriers to discharge. 12. Patient will receive at least 3 hours of therapy per day at least 5 days per week. 13. ELOS: 15-20 days       14. Prognosis:  excellent     Ranelle Oyster, MD, [facility] Physical Medicine & Rehabilitation 05/06/2015   05/06/2015

## 2015-05-07 ENCOUNTER — Inpatient Hospital Stay (HOSPITAL_COMMUNITY): Payer: Medicare PPO | Admitting: Speech Pathology

## 2015-05-07 ENCOUNTER — Inpatient Hospital Stay (HOSPITAL_COMMUNITY): Payer: Medicare PPO | Admitting: Physical Therapy

## 2015-05-07 ENCOUNTER — Inpatient Hospital Stay (HOSPITAL_COMMUNITY): Payer: Medicare PPO

## 2015-05-07 DIAGNOSIS — G8110 Spastic hemiplegia affecting unspecified side: Secondary | ICD-10-CM

## 2015-05-07 DIAGNOSIS — I69322 Dysarthria following cerebral infarction: Secondary | ICD-10-CM

## 2015-05-07 DIAGNOSIS — G8111 Spastic hemiplegia affecting right dominant side: Secondary | ICD-10-CM | POA: Diagnosis present

## 2015-05-07 LAB — GLUCOSE, CAPILLARY
GLUCOSE-CAPILLARY: 109 mg/dL — AB (ref 65–99)
GLUCOSE-CAPILLARY: 120 mg/dL — AB (ref 65–99)
Glucose-Capillary: 149 mg/dL — ABNORMAL HIGH (ref 65–99)
Glucose-Capillary: 101 mg/dL — ABNORMAL HIGH (ref 65–99)

## 2015-05-07 LAB — CBC WITH DIFFERENTIAL/PLATELET
BASOS ABS: 0 10*3/uL (ref 0.0–0.1)
BASOS PCT: 1 % (ref 0–1)
EOS PCT: 2 % (ref 0–5)
Eosinophils Absolute: 0.2 10*3/uL (ref 0.0–0.7)
HCT: 36 % (ref 36.0–46.0)
Hemoglobin: 11.2 g/dL — ABNORMAL LOW (ref 12.0–15.0)
LYMPHS PCT: 32 % (ref 12–46)
Lymphs Abs: 2.3 10*3/uL (ref 0.7–4.0)
MCH: 22.9 pg — ABNORMAL LOW (ref 26.0–34.0)
MCHC: 31.1 g/dL (ref 30.0–36.0)
MCV: 73.5 fL — AB (ref 78.0–100.0)
Monocytes Absolute: 0.5 10*3/uL (ref 0.1–1.0)
Monocytes Relative: 7 % (ref 3–12)
NEUTROS ABS: 4.2 10*3/uL (ref 1.7–7.7)
Neutrophils Relative %: 58 % (ref 43–77)
PLATELETS: 291 10*3/uL (ref 150–400)
RBC: 4.9 MIL/uL (ref 3.87–5.11)
RDW: 15.3 % (ref 11.5–15.5)
WBC: 7.2 10*3/uL (ref 4.0–10.5)

## 2015-05-07 LAB — COMPREHENSIVE METABOLIC PANEL
ALT: 31 U/L (ref 14–54)
ANION GAP: 11 (ref 5–15)
AST: 27 U/L (ref 15–41)
Albumin: 3.6 g/dL (ref 3.5–5.0)
Alkaline Phosphatase: 87 U/L (ref 38–126)
BILIRUBIN TOTAL: 0.7 mg/dL (ref 0.3–1.2)
BUN: 16 mg/dL (ref 6–20)
CALCIUM: 9.1 mg/dL (ref 8.9–10.3)
CO2: 25 mmol/L (ref 22–32)
Chloride: 98 mmol/L — ABNORMAL LOW (ref 101–111)
Creatinine, Ser: 0.84 mg/dL (ref 0.44–1.00)
GFR calc Af Amer: >60 mL/min (ref >60)
Glucose, Bld: 160 mg/dL — ABNORMAL HIGH (ref 65–99)
POTASSIUM: 3.3 mmol/L — AB (ref 3.5–5.1)
Sodium: 134 mmol/L — ABNORMAL LOW (ref 135–145)
TOTAL PROTEIN: 7.3 g/dL (ref 6.5–8.1)

## 2015-05-07 MED ORDER — TRIAMCINOLONE ACETONIDE 0.1 % EX OINT
TOPICAL_OINTMENT | Freq: Two times a day (BID) | CUTANEOUS | Status: DC
  Administered 2015-05-07 – 2015-05-10 (×6): via TOPICAL
  Administered 2015-05-10: 1 via TOPICAL
  Administered 2015-05-11 – 2015-05-16 (×11): via TOPICAL
  Administered 2015-05-16: 1 via TOPICAL
  Administered 2015-05-17: 10:00:00 via TOPICAL
  Administered 2015-05-17: 1 via TOPICAL
  Administered 2015-05-18 – 2015-05-22 (×9): via TOPICAL
  Filled 2015-05-07 (×4): qty 15

## 2015-05-07 MED ORDER — POTASSIUM CHLORIDE CRYS ER 20 MEQ PO TBCR
20.0000 meq | EXTENDED_RELEASE_TABLET | Freq: Every day | ORAL | Status: DC
  Administered 2015-05-07 – 2015-05-21 (×15): 20 meq via ORAL
  Filled 2015-05-07 (×15): qty 1

## 2015-05-07 MED ORDER — CARVEDILOL 12.5 MG PO TABS
12.5000 mg | ORAL_TABLET | Freq: Two times a day (BID) | ORAL | Status: DC
  Administered 2015-05-08 – 2015-05-22 (×29): 12.5 mg via ORAL
  Filled 2015-05-07 (×29): qty 1

## 2015-05-07 NOTE — Progress Notes (Signed)
Patient alert and oriented x 4 with no unsafe behavior at HS. Patient transferred to bedside commode via steady lift. Patient continent of bowel and bladder during HS. Patient with heart monitor to left chest. Heart monitor #1 needs to be applied to left chest before breakfast at 0700 and heart monitor #2 needs to be applied to left chest bedtime at 2100 via family request. adm

## 2015-05-07 NOTE — Evaluation (Signed)
Speech Language Pathology Assessment and Plan  Patient Details  Name: Anne Herrera MRN: [id number] Date of Birth: [birth year]  SLP Diagnosis: Dysarthria  Rehab Potential: Excellent ELOS: 7-10 days for SLP     Today's Date: 05/07/2015 SLP Individual Time: 7858-8502 SLP Individual Time Calculation (min): 60 min   Problem List:  Patient Active Problem List   Diagnosis Date Noted  . Spastic hemiplegia affecting right dominant side 05/07/2015  . 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 Lacunar stroke of left subthalamic region 05/06/2015  . TIA (transient ischemic attack) 04/28/2015   Past Medical History:  Past Medical History  Diagnosis Date  . Diabetes mellitus without complication   . Hypertension   . High cholesterol   . Gout    Past Surgical History:  Past Surgical History  Procedure Laterality Date  . Tubal ligation      Assessment / Plan / Recommendation Clinical Impression  Anne Herrera is a 79 y.o. Left handed female with history of HTN, DM type 2, OSA, left lacunar infarct 04/29/15 who was discharged to home on 9/01 but readmitted to [facility] on 09/02 with slurred speech, inability to use RUE as well as RLE weakness with difficulty walking. MRI brain done revealing slightly enlarged left posterior putamen and left posterior CR infarct. Speech therapy has been working oropharyngeal dysphagia and diet has been advanced to regular textures. Pt admitted to CIR on 05/06/2015.  SLP evaluation completed on 05/07/2015 with the following results:  Pt presents with grossly intact swallowing function for toleration of regular textures and thin liquids.  While pt demonstrated slightly prolonged mastication of solid consistencies, she was able to completely clear solids from the oral cavity post swallow with extra time and self initiated use of liquid wash.  No overt s/s of aspiration were evident with solids or liquids.  Recommend that pt remain on regular textures and thin liquids.  No further ST needs indicated for swallowing at this time.   Pt also presents with grossly intact cognitive function.  Pt scored a 27 out of 30 on the MoCA (N>/= 26) and endorses no changes to her cognitive baseline.  Pt presents with a mild dysarthria resulting from mild right sided labial weakness which intermittently impacts her articulatory precision, especially in the setting of increased fatigue.  Pt was intelligible in conversations but reported being displeased about the way she looks and sounds when communicating with others.  As a result, pt would benefit from brief ST follow 1-3 days per week to address use of compensatory dysarthria strategies in conversations across contexts.  Do not anticipate ST follow up needs at discharge.    Skilled Therapeutic Interventions          Cognitive-linguistic and bedside swallow evaluation completed with results and recommendations reviewed with family. SLP initiated skilled education regarding compensatory strategies for dysarthria; handout was left posted in pt's room.      SLP Assessment  Patient will need skilled Speech Lanaguage Pathology Services during CIR admission    Recommendations  SLP Diet Recommendations: Age appropriate regular solids;Thin Liquid Administration via: Cup;Straw Medication Administration: Whole meds with liquid Supervision: Patient able to self feed Patient destination: Home Follow up Recommendations: Other (comment) (TBD, do not anticipate ST needs at discharge. ) Equipment Recommended: None recommended by SLP    SLP Frequency 1 to 3 out of 7 days   SLP Treatment/Interventions Cueing hierarchy;Environmental controls;Internal/external aids;Patient/family education;Functional tasks   Pain Pain Assessment Pain Assessment: No/denies pain Prior Functioning Cognitive/Linguistic Baseline: Baseline  deficits Baseline deficit details: Pt reports baseline memory deficits, age related, but was independently  Type of Home:  House  Lives With: Alone Available Help at Discharge: Family;Available PRN/intermittently Education: high school  Vocation: Retired  Function:  Eating Eating   Modified Consistency Diet: No Eating Assist Level: Assistive Device   Eating Set Up Assist For: Opening containers;Cutting food       Cognition Comprehension Comprehension assist level: Follows basic conversation/direction with no assist  Expression   Expression assist level: Expresses complex ideas: With extra time/assistive device  Social Interaction Social Interaction assist level: Interacts appropriately with others with medication or extra time (anti-anxiety, antidepressant).  Problem Solving Problem solving assist level: Solves basic 90% of the time/requires cueing < 10% of the time  Memory Memory assist level: Recognizes or recalls 90% of the time/requires cueing < 10% of the time   Short Term Goals: Week 1: SLP Short Term Goal 1 (Week 1): STG=LTG due to ELOS for ST  Refer to Care Plan for Long Term Goals  Recommendations for other services: None  Discharge Criteria: Patient will be discharged from SLP if patient refuses treatment 3 consecutive times without medical reason, if treatment goals not met, if there is a change in medical status, if patient makes no progress towards goals or if patient is discharged from hospital.  The above assessment, treatment plan, treatment alternatives and goals were discussed and mutually agreed upon: by patient and by family  Ladarious Kresse, Selinda Orion 05/07/2015, 4:10 PM

## 2015-05-07 NOTE — Progress Notes (Signed)
Patient information reviewed and entered into eRehab system by Syed Zukas, RN, CRRN, PPS Coordinator.  Information including medical coding and functional independence measure will be reviewed and updated through discharge.     Per nursing patient was given "Data Collection Information Summary for Patients in Inpatient Rehabilitation Facilities with attached "Privacy Act Statement-Health Care Records" upon admission.  

## 2015-05-07 NOTE — Care Management Note (Signed)
Inpatient Rehabilitation Center Individual Statement of Services  Patient Name:  Anne Herrera  Date:  05/07/2015  Welcome to the Inpatient Rehabilitation Center.  Our goal is to provide you with an individualized program based on your diagnosis and situation, designed to meet your specific needs.  With this comprehensive rehabilitation program, you will be expected to participate in at least 3 hours of rehabilitation therapies Monday-Friday, with modified therapy programming on the weekends.  Your rehabilitation program will include the following services:  Physical Therapy (PT), Occupational Therapy (OT), Speech Therapy (ST), 24 hour per day rehabilitation nursing, Therapeutic Recreaction (TR), Neuropsychology, Case Management (Social Worker), Rehabilitation Medicine, Nutrition Services and Pharmacy Services  Weekly team conferences will be held on Wednesday to discuss your progress.  Your Social Worker will talk with you frequently to get your input and to update you on team discussions.  Team conferences with you and your family in attendance may also be held.  Expected length of stay: 2-3 weeks  Overall anticipated outcome: Supervision with set up/cueing  Depending on your progress and recovery, your program may change. Your Social Worker will coordinate services and will keep you informed of any changes. Your Social Worker's name and contact numbers are listed  below.  The following services may also be recommended but are not provided by the Inpatient Rehabilitation Center:   Driving Evaluations  Home Health Rehabiltiation Services  Outpatient Rehabilitation Services    Arrangements will be made to provide these services after discharge if needed.  Arrangements include referral to agencies that provide these services.  Your insurance has been verified to be:  [employer] Your primary doctor is:  Leim Fabry  Pertinent information will be shared with your doctor and  your insurance company.  Social Worker:  Dossie Der, SW [phone] or ([phone]  Information discussed with and copy given to patient by: Lucy Chris, 05/07/2015, 10:37 AM

## 2015-05-07 NOTE — Evaluation (Signed)
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 Spastic hemiplegia affecting right dominant side 05/07/2015  . 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 Lacunar stroke of left subthalamic region 05/06/2015  . TIA (transient ischemic attack) 04/28/2015    Past Medical History:  Past Medical History  Diagnosis Date  . Diabetes mellitus without complication   . Hypertension   . High cholesterol   . Gout    Past Surgical History:  Past Surgical History  Procedure Laterality Date  . Tubal ligation      Assessment & Plan Clinical Impression: Patient is a 79 y.o. left handed female with history of HTN, DM type 2, OSA, left lacunar infarct 04/29/15 who was discharged to home on 9/01 but readmitted to [facility] on 09/02 with slurred speech, inability to use RUE as well as RLE weakness with difficulty walking. MRI brain done revealing slightly enlarged left posterior putamen and left posterior CR infarct. TEE without evidence of thrombus and 30 day event monitor was placed. She was maintained on ASA 81 mg daily for secondary stroke prevention and permissive hypertension to help with perfusion. She has had improvement in right sided weakness and dysarthria. Speech therapy has been working oropharyngeal dysphagia and diet has been advanced to regular textures.    Patient transferred to CIR on 05/06/2015 .    Patient currently requires mod with basic self-care skills secondary to abnormal tone, unbalanced muscle activation and motor apraxia.  Prior to hospitalization, patient could complete BADL/iADL  independently.  Patient will benefit from skilled intervention to increase independence with basic self-care skills and increase level of independence with iADL prior to discharge home with care partner.  Anticipate patient will require minimal physical assistance and follow up home health.  OT - End of Session Endurance Deficit: Yes Endurance Deficit Description: cardiorespiratory OT Assessment Rehab Potential (ACUTE ONLY): Excellent Barriers to Discharge: Decreased caregiver support OT Patient demonstrates impairments in the following area(s): Balance OT Basic ADL's Functional Problem(s): Eating;Grooming;Dressing;Toileting;Bathing OT Advanced ADL's Functional Problem(s): Light Housekeeping;Simple Meal Preparation OT Transfers Functional Problem(s): Toilet;Tub/Shower OT Additional Impairment(s): Fuctional Use of Upper Extremity OT Plan OT Frequency: 5 out of 7 days OT Duration/Estimated Length of Stay: 2-3 weeks OT Treatment/Interventions: Therapeutic Exercise;Therapeutic Activities;UE/LE Coordination activities;Self Care/advanced ADL retraining;Functional mobility training;Neuromuscular re-education;Disease mangement/prevention;DME/adaptive equipment instruction;Discharge planning;Balance/vestibular training OT Self Feeding Anticipated Outcome(s): Mod I OT Basic Self-Care Anticipated Outcome(s): Supervision OT Toileting Anticipated Outcome(s): Supervision OT Bathroom Transfers Anticipated Outcome(s): Supervision OT Recommendation Patient destination: Assisted Living Follow Up Recommendations: Home health OT;Outpatient OT Equipment Recommended: To be determined   Skilled Therapeutic Intervention OT 1:1 eval completed with daughter present.  Pt and dtr educated on role of OT, goals/methods of treatment.   Treatment provided to address functional transfers and effective use of DME.   Pt requires redirection intermittently due to expansive replies to questions.   Suspect mild cognitive  impairment.   Pt transfers at Max A level although able to participate greatly with facilitation to weight-shift and place R-L/UE.   BADL functional scores derived per RN Tech d/t not time to complete bathing/dressing during eval.  OT Evaluation Precautions/Restrictions  Precautions Precautions: Fall Precaution Comments: Fell at [facility] IR while being assisted to toilet by [employer] (w/o device) Restrictions Weight Bearing Restrictions: No  General Chart Reviewed: Yes Family/Caregiver Present: Yes (Daughter Ronny Bacon)  Vital Signs Therapy Vitals Pulse Rate: 64 BP: (!) 108/47 mmHg Patient Position (if appropriate): Sitting Oxygen Therapy SpO2: 98 % O2 Device: Not Delivered  Pain Pain Assessment Pain Assessment: No/denies pain  Home Living/Prior Functioning Home Living Family/patient expects to be discharged to:: Private residence Living Arrangements: Alone Available Help at Discharge: Family, Available PRN/intermittently Type of Home: House Home Access: Stairs to enter [employer] of Steps: 2 steps entry from [employer]: None Home Layout: One level Bathroom Shower/Tub: Walk-in shower, Tub/shower unit, Door (pt typically uses tub-shower combo; seat built in at walk in shower) Biochemist, clinical: Handicapped height Bathroom Accessibility: Yes  Lives With: Alone IADL History Homemaking Responsibilities: Yes Meal Prep Responsibility: Therapist, occupational Responsibility: Primary Cleaning Responsibility: Primary Bill Paying/Finance Responsibility: Primary Shopping Responsibility: Primary Child Care Responsibility: No Current License: Yes Mode of Transportation: Car Education: HS + community college courses Occupation: 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 and Hobbies: Engineer, building services, volunteers at [employer], church Prior Function Level of Independence: Independent with gait, Independent with transfers  Able to Take Stairs?: Yes Driving:  Yes Vocation: Retired Leisure:  Runner, broadcasting/film/video, church) Comments: Drives  ADL ADL ADL Comments: see Functional Assessment  Vision/Perception  Vision- History Baseline Vision/History: Wears glasses Wears Glasses: Reading only Patient Visual Report: No change from baseline Vision- Assessment Vision Assessment?: No apparent visual deficits   Cognition Overall Cognitive Status: Within Functional Limits for tasks assessed Arousal/Alertness: Awake/alert Orientation Level: Person;Place;Situation Person: Oriented Place: Oriented Situation: Oriented Year: 2016 Month: September Day of Week: Correct Memory: Appears intact Immediate Memory Recall: Sock;Blue;Bed Memory Recall: Sock;Blue;Bed Memory Recall Sock: Without Cue Memory Recall Blue: Without Cue Memory Recall Bed: Without Cue Attention: Focused Focused Attention: Appears intact Awareness: Appears intact Problem Solving: Appears intact Safety/Judgment: Appears intact  Sensation Sensation Light Touch: Impaired Detail Light Touch Impaired Details: Impaired RLE;Impaired RUE Stereognosis: Not tested Hot/Cold: Not tested Proprioception: Appears Intact Coordination Gross Motor Movements are Fluid and Coordinated: No Fine Motor Movements are Fluid and Coordinated: No Coordination and Movement Description: R side hemiplegia Finger Nose Finger Test: unable to complete on R side  Heel Shin Test: unable to complete on L side  Motor  Motor Motor: Hemiplegia;Abnormal tone;Motor apraxia Motor - Skilled Clinical Observations: R side hemiplegia; flexor tone in R bicepts  Mobility  Bed Mobility Bed Mobility: Rolling Right;Rolling Left;Right Sidelying to Sit;Sit to Supine;Left Sidelying to Sit Rolling Right: 5: Supervision Rolling Right Details: Visual cues/gestures for sequencing;Verbal cues for technique Rolling Right Details (indicate cue type and reason): use of L arm/leg Rolling Left: 3: Mod assist Rolling Left Details:  Manual facilitation for placement;Verbal cues for precautions/safety;Verbal cues for technique;Verbal cues for sequencing;Manual facilitation for weight shifting Right Sidelying to Sit: 3: Mod assist;HOB flat Right Sidelying to Sit Details: Manual facilitation for weight shifting;Verbal cues for sequencing;Verbal cues for technique;Verbal cues for precautions/safety Left Sidelying to Sit: 3: Mod assist;HOB flat Left Sidelying to Sit Details: Manual facilitation for weight shifting;Manual facilitation for placement;Verbal cues for technique;Verbal cues for sequencing;Verbal cues for precautions/safety Supine to Sit: 3: Mod assist;With rails;HOB flat Supine to Sit Details: Manual facilitation for weight bearing;Manual facilitation for placement;Manual facilitation for weight shifting;Verbal cues for safe use of DME/AE;Verbal cues for technique Sitting - Scoot to Edge of Bed: 3: Mod assist Sitting -  Scoot to [employer] of Bed Details: Manual facilitation for weight bearing Sitting - Scoot to Edge of Bed Details (indicate cue type and reason): Faciliation to shift weight to left hip and advance right hip Sit to Supine: 4: Min assist Sit to Supine - Details: Manual facilitation for placement;Manual facilitation for weight shifting;Verbal cues for sequencing;Verbal cues for technique Scooting to [facility]: 4: Min assist Scooting to [facility] Details: Manual facilitation for placement Scooting to [facility] Details (indicate cue type and reason): Facilitation to block right LE hip/knee extension d/t strong extension synergy Transfers Transfers: Sit to Stand;Stand to Sit Sit to Stand: 2: Max assist Sit to Stand Details: Manual facilitation for weight shifting;Manual facilitation for placement;Verbal cues for precautions/safety;Verbal cues for technique;Verbal cues for sequencing Sit to Stand Details (indicate cue type and reason): Very fearful initially with stranding but improved dramatically with successive attempts; likely  rapid progression to Mod A Stand to Sit: 2: Max assist;With upper extremity assist;With armrests;To bed;To chair/3-in-1 Stand to Sit Details (indicate cue type and reason): Manual facilitation for placement;Manual facilitation for weight bearing;Verbal cues for sequencing;Verbal cues for technique;Verbal cues for precautions/safety   Trunk/Postural Assessment  Cervical Assessment Cervical Assessment: Exceptions to [facility] Cervical AROM Overall Cervical AROM: Deficits;Due to premorid status Overall Cervical AROM Comments: limited cervical side flexion 75% and limited cervical extension 75%; otherwise wfl Thoracic Assessment Thoracic Assessment: Exceptions to [facility] Thoracic AROM Overall Thoracic AROM: Deficits;Due to premorid status Overall Thoracic AROM Comments: grossly limited 75% Lumbar Assessment Lumbar Assessment: Exceptions to [facility] Lumbar AROM Overall Lumbar AROM: Deficits;Due to premorid status Overall Lumbar AROM Comments: grossly limited 75% Postural Control Postural Control: Deficits on evaluation Trunk Control: retropulsive in sit Righting Reactions: delayed and difficult to achieve without assistance   Balance Balance Balance Assessed: Yes Static Sitting Balance Static Sitting - Balance Support: Right upper extremity supported;Left upper extremity supported;Feet supported Static Sitting - Level of Assistance: 5: Stand by assistance Dynamic Sitting Balance Dynamic Sitting - Balance Support: Right upper extremity supported;Left upper extremity supported;Feet supported;During functional activity Dynamic Sitting - Level of Assistance: 4: Min assist Static Standing Balance Static Standing - Balance Support: No upper extremity supported;During functional activity Static Standing - Level of Assistance: 3: Mod assist Dynamic Standing Balance Dynamic Standing - Balance Support: No upper extremity supported;During functional activity Dynamic Standing - Level of Assistance: 2: Max  assist  Extremity/Trunk Assessment RUE Assessment RUE Assessment: Exceptions to [facility] RUE AROM (degrees) Overall AROM Right Upper Extremity: Deficits RUE Overall AROM Comments: due to weakness RUE PROM (degrees) Overall PROM Right Upper Extremity: Within functional limits for tasks performed RUE Overall PROM Comments: no c/o shoulder pain with PROM into shoulder flexion in supine RUE Strength RUE Overall Strength: Deficits RUE Overall Strength Comments: grip 3-/5, elbow flexion 3-/5, tricepts 2+/5, arm flexion 2+/5, finger extension 2-/5 RUE Tone RUE Tone: Hypertonic;Modified Ashworth Hypertonic Details: Proximal hypertonicity Modified Ashworth Scale for Grading Hypertonia RUE: Slight increase in muscle tone, manifested by a catch, followed by minimal resistance throughout the remainder (less than half) of the ROM RUE Tone Comments: elbow flexors LUE Assessment LUE Assessment: Within Functional Limits   See Function Navigator for Current Functional Status.   Refer to Care Plan for Long Term Goals  Recommendations for other services: None  Discharge Criteria: Patient will be discharged from OT if patient refuses treatment 3 consecutive times without medical reason, if treatment goals not met, if there is a change in medical status, if patient makes no progress towards goals or if patient  is discharged from hospital.  The above assessment, treatment plan, treatment alternatives and goals were discussed and mutually agreed upon: by patient and by family  Coralie Stanke 05/07/2015, 1:19 PM

## 2015-05-07 NOTE — Evaluation (Signed)
Physical Therapy Assessment and Plan  Patient Details  Name: Jazell Rosenau MRN: [id number] Date of Birth: 05-27-1936  PT Diagnosis: Abnormal posture, Abnormality of gait, Coordination disorder, Difficulty walking, Dizziness and giddiness, Edema, Hemiplegia non-dominant, Hypertonia, Impaired cognition, Impaired sensation, Muscle weakness and Vertigo Rehab Potential: Good ELOS: 2-3 weeks   Today's Date: 05/07/2015 PT Individual Time: 1030-1200 Treatment Time: 90 min    Problem List:  Patient Active Problem List   Diagnosis Date Noted  . Spastic hemiplegia affecting right dominant side 05/07/2015  . 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 Lacunar stroke of left subthalamic region 05/06/2015  . TIA (transient ischemic attack) 04/28/2015    Past Medical History:  Past Medical History  Diagnosis Date  . Diabetes mellitus without complication   . Hypertension   . High cholesterol   . Gout    Past Surgical History:  Past Surgical History  Procedure Laterality Date  . Tubal ligation      Assessment & Plan Clinical Impression: Doral Digangi is a 79 y.o. Left handed female with history of HTN, DM type 2, OSA, left lacunar infarct 04/29/15 who was discharged to home on 9/01 but readmitted to [facility] on 09/02 with slurred speech, inability to use RUE as well as RLE weakness with difficulty walking. MRI brain done revealing slightly enlarged left posterior putamen and left posterior CR infarct. TEE without evidence of thrombus and 30 day event monitor was placed. She was maintained on ASA 81 mg daily for secondary stroke prevention and permissive hypertension to help with perfusion. She has had improvement in right sided weakness and dysarthria. Speech therapy has been working oropharyngeal dysphagia and diet has been advanced to regular textures. Therapy ongoing and CIR recommended by MD and Rehab team.  Patient transferred to CIR on 05/06/2015 .   Patient currently requires total  with mobility secondary to muscle weakness, decreased cardiorespiratoy endurance, impaired timing and sequencing, abnormal tone, unbalanced muscle activation, motor apraxia, decreased coordination and decreased motor planning, decreased memory, central vs peripheral tbd and decreased sitting balance, decreased standing balance, decreased postural control, hemiplegia and decreased balance strategies.  Prior to hospitalization, patient was independent  with mobility and lived with Alone in a House home.  Home access is 2 steps entry from carportStairs to enter.  Patient will benefit from skilled PT intervention to maximize safe functional mobility, minimize fall risk and decrease caregiver burden for planned discharge home with intermittent assist vs transition to ALF.  Anticipate patient will benefit from follow up [place] at discharge.  PT - End of Session Activity Tolerance: Tolerates < 10 min activity with changes in vital signs (SOB with exertion) Endurance Deficit: Yes Endurance Deficit Description: cardiorespiratory PT Assessment Rehab Potential (ACUTE/IP ONLY): Good Barriers to Discharge: Inaccessible home environment;Decreased caregiver support PT Patient demonstrates impairments in the following area(s): Balance;Edema;Endurance;Motor;Pain;Safety;Sensory PT Transfers Functional Problem(s): Bed Mobility;Bed to Chair;Car;Furniture PT Locomotion Functional Problem(s): Ambulation;Wheelchair Mobility;Stairs PT Plan PT Intensity: Minimum of 1-2 x/day ,45 to 90 minutes PT Frequency: 5 out of 7 days PT Duration Estimated Length of Stay: 2-3 weeks PT Treatment/Interventions: Ambulation/gait training;Discharge planning;Functional mobility training;Psychosocial support;Therapeutic Activities;Balance/vestibular training;Disease management/prevention;Neuromuscular re-education;Skin care/wound management;Therapeutic Exercise;Wheelchair propulsion/positioning;Cognitive remediation/compensation;DME/adaptive  equipment instruction;Splinting/orthotics;UE/LE Strength taining/ROM;Community reintegration;Functional electrical stimulation;Patient/family education;Stair training;UE/LE Coordination activities PT Transfers Anticipated Outcome(s): supervision PT Locomotion Anticipated Outcome(s): supervision PT Recommendation Recommendations for Other Services: Vestibular eval Follow Up Recommendations: 24 hour supervision/assistance;Home health PT Patient destination: Assisted Living (home vs ALF tbd) Equipment Recommended: To be determined  Skilled Therapeutic Intervention PT Evaluation -  Pt presents with R side hemiplegia from a stroke in late August that progressed, now with hemiplegia, hypertonia in elbow flexors, likely motor apraxia in leg and arm, which is limiting pt's safety and independence in all aspects of functional mobility. Therapeutic Activity - see below for all functional mobility measures. Pt has significant difficulty with forward lean during sit to stand, req manual facilitation for forward trunk lean. Gait Training - see below for details. Pt did well with L wall rail and is ready to attempt either RW or HW at next PT session. W/C Management - see below for details. Pt is in a super hemi height w/c and demonstrates good foot to floor contact with this height/size chair Slight breezing of R side of body on hallway wall on R - PT thinks this is not due to inattention ,but moreso due to weakness and difficulty steering. Therapeutic Exercise - PT instructs pt in R UE AROM and strengthening exercises: elbow flexion in supine x 10, PT supports R arm at 90 degrees arm flexion and pt demonstrates tricepts activation through partial ROM anti-gravity x 10 reps, and R heel slides AROM x 10 reps with diminishing ROM due to fatigue. Pt anticipated to benefit from IPR PT. Continue per PT POC.    PT Evaluation Precautions/Restrictions Precautions Precautions: Fall Precaution Comments: Fell at [facility] IR  while being assisted to toilet by [employer] (w/o device) Restrictions Weight Bearing Restrictions: No General Chart Reviewed: Yes Family/Caregiver Present: Yes Ronny Bacon daughter lives in [place])  Vital SignsTherapy Vitals Pulse Rate: 64 BP: (!) 108/47 mmHg Patient Position (if appropriate): Sitting Oxygen Therapy SpO2: 98 % O2 Device: Not Delivered Pain Pain Assessment Pain Assessment: No/denies pain Home Living/Prior Functioning Home Living Living Arrangements: Alone Available Help at Discharge: Family;Available PRN/intermittently Type of Home: House Home Access: Stairs to enter [employer] of Steps: 2 steps entry from [employer]: None Home Layout: One level Bathroom Shower/Tub: Walk-in shower;Tub/shower unit;Door (pt typically uses tub-shower combo; seat built in at walk in shower) Bathroom Toilet: Handicapped height Bathroom Accessibility: Yes  Lives With: Alone Prior Function Level of Independence: Independent with gait;Independent with transfers  Able to Take Stairs?: Yes Driving: Yes Vocation: Retired Leisure:  Runner, broadcasting/film/video, church) Comments: Drives Vision/Perception    Appears WFL - pt wears reading glasses at baseline. To continue to be tested in a functional context.  Cognition Overall Cognitive Status: Within Functional Limits for tasks assessed Arousal/Alertness: Awake/alert Orientation Level: Oriented X4 Attention: Focused Focused Attention: Appears intact Memory: Appears intact Awareness: Appears intact Problem Solving: Appears intact Safety/Judgment: Appears intact Sensation Sensation Light Touch: Impaired Detail Light Touch Impaired Details: Impaired RLE;Impaired RUE Stereognosis: Not tested Hot/Cold: Not tested Proprioception: Appears Intact Coordination Gross Motor Movements are Fluid and Coordinated: No Fine Motor Movements are Fluid and Coordinated: No Coordination and Movement Description: R side  hemiplegia Finger Nose Finger Test: unable to complete on R side  Heel Shin Test: unable to complete on L side Motor  Motor Motor: Hemiplegia;Abnormal tone;Motor apraxia Motor - Skilled Clinical Observations: R side hemiplegia; flexor tone in R bicepts  Mobility Bed Mobility Bed Mobility: Rolling Right;Rolling Left;Right Sidelying to Sit;Sit to Supine;Left Sidelying to Sit Rolling Right: 5: Supervision Rolling Right Details: Visual cues/gestures for sequencing;Verbal cues for technique Rolling Right Details (indicate cue type and reason): use of L arm/leg Rolling Left: 3: Mod assist Rolling Left Details: Manual facilitation for placement;Verbal cues for precautions/safety;Verbal cues for technique;Verbal cues for sequencing;Manual facilitation for weight shifting Right Sidelying  to Sit: 3: Mod assist;HOB flat Right Sidelying to Sit Details: Manual facilitation for weight shifting;Verbal cues for sequencing;Verbal cues for technique;Verbal cues for precautions/safety Left Sidelying to Sit: 3: Mod assist;HOB flat Left Sidelying to Sit Details: Manual facilitation for weight shifting;Manual facilitation for placement;Verbal cues for technique;Verbal cues for sequencing;Verbal cues for precautions/safety Supine to Sit: 3: Mod assist;With rails;HOB flat Supine to Sit Details: Manual facilitation for weight bearing;Manual facilitation for placement;Manual facilitation for weight shifting;Verbal cues for safe use of DME/AE;Verbal cues for technique Sitting - Scoot to Edge of Bed: 3: Mod assist Sitting - Scoot to Edge of Bed Details: Manual facilitation for weight bearing Sitting - Scoot to [place] of Bed Details (indicate cue type and reason): Faciliation to shift weight to left hip and advance right hip Sit to Supine: 4: Min assist Sit to Supine - Details: Manual facilitation for placement;Manual facilitation for weight shifting;Verbal cues for sequencing;Verbal cues for technique Scooting to [facility]:  4: Min assist Scooting to [facility] Details: Manual facilitation for placement Scooting to [facility] Details (indicate cue type and reason): Facilitation to block right LE hip/knee extension d/t strong extension synergy Transfers Transfers: Yes Sit to Stand: 2: Max assist Sit to Stand Details: Manual facilitation for weight shifting;Manual facilitation for placement;Verbal cues for precautions/safety;Verbal cues for technique;Verbal cues for sequencing Sit to Stand Details (indicate cue type and reason): Very fearful initially with stranding but improved dramatically with successive attempts; likely rapid progression to Mod A Stand to Sit: 2: Max assist;With upper extremity assist;With armrests;To bed;To chair/3-in-1 Stand to Sit Details (indicate cue type and reason): Manual facilitation for placement;Manual facilitation for weight bearing;Verbal cues for sequencing;Verbal cues for technique;Verbal cues for precautions/safety Stand Pivot Transfers: 2: Max assist Stand Pivot Transfer Details: Manual facilitation for placement;Manual facilitation for weight shifting;Verbal cues for sequencing;Verbal cues for technique;Verbal cues for precautions/safety Locomotion  Ambulation Ambulation: Yes Ambulation/Gait Assistance: 1: +2 Total assist Ambulation Distance (Feet): 30 Feet Assistive device: Other (Comment) (wall rail) Ambulation/Gait Assistance Details: Manual facilitation for weight shifting;Manual facilitation for placement;Verbal cues for technique;Verbal cues for precautions/safety;Verbal cues for sequencing Gait Gait: Yes Gait Pattern: Impaired Gait Pattern: Step-through pattern;Decreased step length - left;Right genu recurvatum;Right flexed knee in stance;Poor foot clearance - right Gait velocity: decreased Stairs / Additional Locomotion Stairs: Yes Stairs Assistance: 3: Mod assist Stairs Assistance Details: Manual facilitation for weight shifting;Manual facilitation for placement;Verbal cues for  sequencing;Verbal cues for technique;Verbal cues for precautions/safety Stair Management Technique: Two rails Number of Stairs: 1 Height of Stairs: 6 Wheelchair Mobility Wheelchair Mobility: Yes Wheelchair Assistance: 5: Supervision Wheelchair Assistance Details: Verbal cues for safe use of DME/AE Wheelchair Propulsion: Left upper extremity;Left lower extremity Wheelchair Parts Management: Needs assistance Distance: 55  Trunk/Postural Assessment  Cervical Assessment Cervical Assessment: Exceptions to [facility] Cervical AROM Overall Cervical AROM: Deficits;Due to premorid status Overall Cervical AROM Comments: limited cervical side flexion 75% and limited cervical extension 75%; otherwise wfl Thoracic Assessment Thoracic Assessment: Exceptions to [facility] Thoracic AROM Overall Thoracic AROM: Deficits;Due to premorid status Overall Thoracic AROM Comments: grossly limited 75% Lumbar Assessment Lumbar Assessment: Exceptions to [facility] Lumbar AROM Overall Lumbar AROM: Deficits;Due to premorid status Overall Lumbar AROM Comments: grossly limited 75% Postural Control Postural Control: Deficits on evaluation Trunk Control: retropulsive in sit Righting Reactions: delayed and difficult to achieve without assistance  Balance Balance Balance Assessed: Yes Static Sitting Balance Static Sitting - Balance Support: Right upper extremity supported;Left upper extremity supported;Feet supported Static Sitting - Level of Assistance: 5: Stand by assistance Dynamic Sitting  Balance Dynamic Sitting - Balance Support: Right upper extremity supported;Left upper extremity supported;Feet supported;During functional activity Dynamic Sitting - Level of Assistance: 4: Min assist Static Standing Balance Static Standing - Balance Support: No upper extremity supported;During functional activity Static Standing - Level of Assistance: 3: Mod assist Dynamic Standing Balance Dynamic Standing - Balance Support: No upper  extremity supported;During functional activity Dynamic Standing - Level of Assistance: 2: Max assist Extremity Assessment  RUE Assessment RUE Assessment: Exceptions to [facility] RUE AROM (degrees) Overall AROM Right Upper Extremity: Deficits RUE Overall AROM Comments: due to weakness RUE PROM (degrees) Overall PROM Right Upper Extremity: Within functional limits for tasks performed RUE Overall PROM Comments: no c/o shoulder pain with PROM into shoulder flexion in supine RUE Strength RUE Overall Strength: Deficits RUE Overall Strength Comments: grip 3-/5, elbow flexion 3-/5, tricepts 2+/5, arm flexion 2+/5, finger extension 2-/5 RUE Tone RUE Tone: Hypertonic;Modified Ashworth Hypertonic Details: Proximal hypertonicity Modified Ashworth Scale for Grading Hypertonia RUE: Slight increase in muscle tone, manifested by a catch, followed by minimal resistance throughout the remainder (less than half) of the ROM RUE Tone Comments: elbow flexors LUE Assessment LUE Assessment: Within Functional Limits RLE Assessment RLE Assessment: Exceptions to [facility] RLE AROM (degrees) Overall AROM Right Lower Extremity: Deficits;Due to decreased strength RLE PROM (degrees) Overall PROM Right Lower Extremity: Within functional limits for tasks assessed RLE Strength RLE Overall Strength: Deficits RLE Overall Strength Comments: hip flexion 2+/5, hip abduction 2-/5, hip adduction 2/5, knee extension 3+/5, knee flexion 2-/5, ankle DF trace RLE Tone RLE Tone: Hypotonic Hypotonic Details: general low tone in R leg LLE Assessment LLE Assessment: Within Functional Limits   See Function Navigator for Current Functional Status.   Refer to Care Plan for Long Term Goals  Recommendations for other services: None  Discharge Criteria: Patient will be discharged from PT if patient refuses treatment 3 consecutive times without medical reason, if treatment goals not met, if there is a change in medical status, if patient  makes no progress towards goals or if patient is discharged from hospital.  The above assessment, treatment plan, treatment alternatives and goals were discussed and mutually agreed upon: by patient  [facility] M 05/07/2015, 12:26 PM

## 2015-05-07 NOTE — Progress Notes (Signed)
Subjective/Complaints: 79 y.o. Left handed female with history of HTN, DM type 2, OSA, left lacunar infarct 04/29/15 who was discharged to home on 9/01 but readmitted to [facility] on 09/02 with slurred speech, inability to use RUE as well as RLE weakness with difficulty walking.  MRI brain done revealing slightly enlarged left posterior putamen and left posterior CR infarct. TEE without evidence of thrombus and 30 day event monitor was placed.  She was maintained on ASA 81 mg daily for secondary stroke prevention and permissive hypertension to help with perfusion  Bed uncomfortable but no other c/os ROS- freq urination, no BM since admit to rehab, no pain c/os, no swallowing c/os, no SOB or CP  Objective: Vital Signs: Blood pressure 126/66, pulse 76, temperature 98 F (36.7 C), temperature source Oral, resp. rate 18, height 5' 4"  (1.626 m), weight 84.369 kg (186 lb), SpO2 97 %. No results found. Results for orders placed or performed during the hospital encounter of 05/06/15 (from the past 72 hour(s))  Glucose, capillary     Status: Abnormal   Collection Time: 05/06/15  4:39 PM  Result Value Ref Range   Glucose-Capillary 133 (H) 65 - 99 mg/dL  Glucose, capillary     Status: Abnormal   Collection Time: 05/06/15  9:01 PM  Result Value Ref Range   Glucose-Capillary 170 (H) 65 - 99 mg/dL   Comment 1 Notify RN   Glucose, capillary     Status: Abnormal   Collection Time: 05/07/15  6:17 AM  Result Value Ref Range   Glucose-Capillary 149 (H) 65 - 99 mg/dL   Comment 1 Notify RN   Comprehensive metabolic panel     Status: Abnormal   Collection Time: 05/07/15  7:53 AM  Result Value Ref Range   Sodium 134 (L) 135 - 145 mmol/L   Potassium 3.3 (L) 3.5 - 5.1 mmol/L   Chloride 98 (L) 101 - 111 mmol/L   CO2 25 22 - 32 mmol/L   Glucose, Bld 160 (H) 65 - 99 mg/dL   BUN 16 6 - 20 mg/dL   Creatinine, Ser 0.84 0.44 - 1.00 mg/dL   Calcium 9.1 8.9 - 10.3 mg/dL   Total Protein 7.3 6.5 - 8.1 g/dL   Albumin 3.6  3.5 - 5.0 g/dL   AST 27 15 - 41 U/L   ALT 31 14 - 54 U/L   Alkaline Phosphatase 87 38 - 126 U/L   Total Bilirubin 0.7 0.3 - 1.2 mg/dL   GFR calc non Af Amer >60 >60 mL/min   GFR calc Af Amer >60 >60 mL/min    Comment: (NOTE) The eGFR has been calculated using the CKD EPI equation. This calculation has not been validated in all clinical situations. eGFR's persistently <60 mL/min signify possible Chronic Kidney Disease.    Anion gap 11 5 - 15  CBC WITH DIFFERENTIAL     Status: Abnormal   Collection Time: 05/07/15  7:53 AM  Result Value Ref Range   WBC 7.2 4.0 - 10.5 K/uL   RBC 4.90 3.87 - 5.11 MIL/uL   Hemoglobin 11.2 (L) 12.0 - 15.0 g/dL   HCT 36.0 36.0 - 46.0 %   MCV 73.5 (L) 78.0 - 100.0 fL   MCH 22.9 (L) 26.0 - 34.0 pg   MCHC 31.1 30.0 - 36.0 g/dL   RDW 15.3 11.5 - 15.5 %   Platelets 291 150 - 400 K/uL   Neutrophils Relative % 58 43 - 77 %   Neutro Abs 4.2 1.7 -  7.7 K/uL   Lymphocytes Relative 32 12 - 46 %   Lymphs Abs 2.3 0.7 - 4.0 K/uL   Monocytes Relative 7 3 - 12 %   Monocytes Absolute 0.5 0.1 - 1.0 K/uL   Eosinophils Relative 2 0 - 5 %   Eosinophils Absolute 0.2 0.0 - 0.7 K/uL   Basophils Relative 1 0 - 1 %   Basophils Absolute 0.0 0.0 - 0.1 K/uL     HEENT: normal and no strabismus Cardio: RRR and no murmur Resp: CTA B/L GI: BS positive and NT, ND Extremity:  No Edema Skin:   Other psoriatic patch bilateral pre patellar Neuro: Alert/Oriented, Cranial Nerve Abnormalities right central 7, Abnormal Sensory Right side feels funny vs left side but able to identify LT on R side, Abnormal Motor 2- R delt , bi, tri, grip, HF, KE, ADF, Abnormal FMC Ataxic/ dec FMC and Dysarthric Musc/Skel:  Other mild Right elbow flexion contracture Gen NAD   Assessment/Plan: 1. 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 Physiatrist  is providing close team supervision and 24 hour management of active medical problems listed below. Physiatrist and rehab team continue to assess barriers to discharge/monitor patient progress toward functional and medical goals. FIM:       Function - Toileting Toileting steps completed by helper: Adjust clothing prior to toileting, Performs perineal hygiene, Adjust clothing after toileting Assist level: Two helpers  Function - Air cabin crew transfer assistive device: Facilities manager lift: Stedy Assist level from toilet: Maximal assist (Pt 25 - 49%/lift and lower)        Function - Comprehension Comprehension: Auditory, Visual Comprehension assist level: Follows complex conversation/direction with no assist  Function - Expression Expression: Verbal Expression assist level: Expresses complex ideas: With extra time/assistive device  Function - Social Interaction Social Interaction assist level: Interacts appropriately with others with medication or extra time (anti-anxiety, antidepressant).  Function - Problem Solving Problem solving assist level: Solves complex problems: Recognizes & self-corrects  Function - Memory Memory assist level: Complete Independence: No helper Patient normally able to recall (first 3 days only): Current season, Location of own room, Staff names and faces, That he or she is in a hospital  Medical Problem List and Plan: 1. Functional deficits secondary towith extension.  -ASA for stroke prophylaxis -removable event monitor in place 2. DVT Prophylaxis/Anticoagulation: Pharmaceutical: Lovenox 3. Pain Management: Tylenol prn pain. 4. Mood: LCSW to follow for evaluation and support.  5. Neuropsych: This patient is capable of making decisions on her own behalf. 6. Skin/Wound Care: Routine pressure relief measures.  7. Fluids/Electrolytes/Nutrition: Monitor I/O. Check lytes in am. Offer supplements if  intake poor.  8. HTN: Monitor bid. Continue coreg bid. Off HCTZ at this time. BP 126/66 ok , monitor  9. DM type 2: Monitor BS ac/hs. Continue glipizide and metformin. Use SSI for elevated BS and adjust medications as indicated. Serum glucose 160 this am 10. H/o Gout: Controlled off medications.  11.  Psoriasis, local tx LOS (Days) 1 A FACE TO FACE EVALUATION WAS PERFORMED  Iann Rodier E 05/07/2015, 8:47 AM

## 2015-05-07 NOTE — Progress Notes (Signed)
Social Work Assessment and Plan Social Work Assessment and Plan  Patient Details  Name: Anne Herrera MRN: [id number] Date of Birth: November 25, 1935  Today's Date: 05/07/2015  Problem List:  Patient Active Problem List   Diagnosis Date Noted  . Spastic hemiplegia affecting right dominant side 05/07/2015  . 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 Lacunar stroke of left subthalamic region 05/06/2015  . TIA (transient ischemic attack) 04/28/2015   Past Medical History:  Past Medical History  Diagnosis Date  . Diabetes mellitus without complication   . Hypertension   . High cholesterol   . Gout    Past Surgical History:  Past Surgical History  Procedure Laterality Date  . Tubal ligation     Social History:  reports that she has never smoked. She has never used smokeless tobacco. She reports that she does not drink alcohol or use illicit drugs.  Family / Support Systems Marital Status: Divorced Patient Roles: Parent, Volunteer Children: Jay-son [phone] Other Supports: Natasha-daughter [phone] Anticipated Caregiver: Unsure at this time, pt does not want to go to her home alone due to falling so now has a fear of falling Ability/Limitations of Caregiver: Son works in [place] but lives in [place]. Daughter here for a  short time from [place]. Pt has friends who are supportive but no other family, according to daughter Caregiver Availability: Other (Comment) (At this point no plan for home, wants to go elsewhere first) Family Dynamics: Close with her children but daughter out of state and son busy with his job. She has several friends and neighbors who are willing to check on her and provide transportation.  Pt has always been independent and taken care of herself. This is a new experience for her.  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    Abuse/Neglect Physical Abuse: Denies Verbal Abuse: Denies Sexual Abuse: Denies Exploitation of patient/patient's resources: Denies Self-Neglect: Denies  Emotional Status Pt's affect, behavior adn adjustment status: Pt is able to express herself and the issues she foresee's she will have.  Due to falling at home she is afraid to go home alone and feels this will not be an option at discharge. She has always been very active and involved in numerous activties, church, community and goes to the gym daily. Recent Psychosocial Issues: her other health problems she thought were being managed until this happened. She still does not know why she had a stroke Pyschiatric History: No history deferred depression screen due to doing well at this point and feels she is delaing with it in her way. She probably would benefit from Neuro-psych to see while here due to how active and independent she is.   Substance Abuse History: No issues  Patient / Family Perceptions, Expectations & Goals Pt/Family understanding of illness & functional limitations: Pt and daughter ahve a good understanding of her stroke and deficits.  Still unsure how this occurred, but talks with the MD's daily and feels they will figure it out. Currently has a temporary heart monitor on from [facility]. Pt is not shy about asking questions or expressing her concerns. Premorbid pt/family roles/activities: Mother, Retiree, Agricultural consultant, Network engineer, Friend, church member, Chief Financial Officer, etc Anticipated changes in roles/activities/participation: resume Pt/family expectations/goals: Pt states: " I want to be as independent as  I can be, but don't feel comfortable going home alone from here."  Daughter states: " She has a fear of falling now, but I agree she  needs to go somewhere after here."  [employer]: None Premorbid Home Care/DME Agencies: None Transportation available at discharge: Family and friends Resource referrals recommended: Neuropsychology, Support group (specify)  Discharge Planning Living Arrangements: Alone Support Systems: Children, Manufacturing engineer, Psychologist, clinical community Type of Residence: Private residence Insurance Resources: Media planner (specify) ([employer] Medicare) Surveyor, quantity Resources: Tree surgeon, Other (Comment) (pension) Financial Screen Referred: No Living Expenses: Own Money Management: Patient Does the patient have any problems obtaining your medications?: No Home Management: Patient Patient/Family Preliminary Plans: Discharge plan is up in the air at this point it is dependent upon her recovery form this stroke and whjat her needs will be. She does not have 24 hr care for home.  So will need to come up with a safe discharge plan. Have made them aware she has [facility] and depending upon how well she does they may not cover SNF and they do not cover ALF. Discussed options ie hiring and Nh versus ALF. Will await her progress. Social Work Anticipated Follow Up Needs: HH/OP, SNF, ALF/IL, Support Group  Clinical Impression Very independent active lady who is motivated to recover from this stroke. Her daughter and son are very supportive, daughter is out of state and son works days but also stresses pt out, according to daughter. Both feel she is not able to return home alone upon discharge form rehab and want her to go transition to another facility then from there home or apartment. Will need to await team's evaluations and see what level pt will Achieve by discharge and work on a safe discharge plan.  Pt and daughter aware since she came to rehab her insurance may deny going to another facility from here.  Lucy Chris 05/07/2015, 11:12 AM

## 2015-05-08 ENCOUNTER — Ambulatory Visit (HOSPITAL_COMMUNITY): Payer: Medicare PPO | Admitting: Physical Therapy

## 2015-05-08 ENCOUNTER — Inpatient Hospital Stay (HOSPITAL_COMMUNITY): Payer: Medicare PPO | Admitting: Speech Pathology

## 2015-05-08 ENCOUNTER — Inpatient Hospital Stay (HOSPITAL_COMMUNITY): Payer: Medicare PPO | Admitting: Occupational Therapy

## 2015-05-08 ENCOUNTER — Inpatient Hospital Stay (HOSPITAL_COMMUNITY): Payer: Medicare PPO

## 2015-05-08 DIAGNOSIS — G8111 Spastic hemiplegia affecting right dominant side: Secondary | ICD-10-CM

## 2015-05-08 DIAGNOSIS — I69322 Dysarthria following cerebral infarction: Secondary | ICD-10-CM

## 2015-05-08 LAB — GLUCOSE, CAPILLARY
GLUCOSE-CAPILLARY: 139 mg/dL — AB (ref 65–99)
GLUCOSE-CAPILLARY: 100 mg/dL — AB (ref 65–99)
Glucose-Capillary: 95 mg/dL (ref 65–99)
Glucose-Capillary: 122 mg/dL — ABNORMAL HIGH (ref 65–99)

## 2015-05-08 NOTE — IPOC Note (Addendum)
Overall Plan of Care [facility]) Patient Details Name: Anne Herrera MRN: [id number] DOB: November 20, 1935  Admitting Diagnosis: L CVA  Hospital Problems: Active Problems:   Lacunar stroke of left subthalamic region   Spastic hemiplegia affecting right dominant side   Dysarthria, post-stroke     Functional Problem List: Nursing Motor, Sensory, Medication Management, Endurance  PT Balance, Edema, Endurance, Motor, Pain, Safety, Sensory  OT Balance  SLP Linguistic  TR Activity tolerance, functional mobility, balance, safety, pain       Basic ADL's: OT Eating, Grooming, Dressing, Toileting, Bathing     Advanced  ADL's: OT Light Housekeeping, Simple Meal Preparation     Transfers: PT Bed Mobility, Bed to Chair, Car, Occupational psychologist, Research scientist (life sciences): PT Ambulation, Psychologist, prison and probation services, Stairs     Additional Impairments: OT Fuctional Use of Upper Extremity  SLP Communication expression    TR  community reintegration    Anticipated Outcomes Item Anticipated Outcome  Self Feeding Mod I  Swallowing      Basic self-care  Marketing executive Transfers Supervision  Bowel/Bladder  mod I  Transfers  supervision  Locomotion  supervision  Communication  mod I   Cognition     Pain  < 3  Safety/Judgment  Mod I   Therapy Plan: PT Intensity: Minimum of 1-2 x/day ,45 to 90 minutes PT Frequency: 5 out of 7 days PT Duration Estimated Length of Stay: 2-3 weeks OT Frequency: 5 out of 7 days OT Duration/Estimated Length of Stay: 2-3 weeks SLP Intensity: Minumum of 1-2 x/day, 30 to 90 minutes SLP Frequency: 1 to 3 out of 7 days SLP Duration/Estimated Length of Stay: 7-10 days for SLP   TR Duration/ELOS:  2 weeks TR Frequency:  Min 1 time per week >20 minutes        Team Interventions: Nursing Interventions Patient/Family Education, Disease Management/Prevention, Dysphagia/Aspiration Printmaker, Medication Management   PT interventions Ambulation/gait training, Discharge planning, Functional mobility training, Psychosocial support, Therapeutic Activities, Balance/vestibular training, Disease management/prevention, Neuromuscular re-education, Skin care/wound management, Therapeutic Exercise, Wheelchair propulsion/positioning, Cognitive remediation/compensation, DME/adaptive equipment instruction, Splinting/orthotics, UE/LE Strength taining/ROM, Firefighter, Development worker, international aid stimulation, Patient/family education, Museum/gallery curator, UE/LE Coordination activities  OT Interventions Therapeutic Exercise, Therapeutic Activities, UE/LE Coordination activities, Self Care/advanced ADL retraining, Functional mobility training, Neuromuscular re-education, Disease mangement/prevention, DME/adaptive equipment instruction, Discharge planning, Warden/ranger  SLP Interventions Cueing hierarchy, Environmental controls, Internal/external aids, Patient/family education, Functional tasks  TR Interventions Recreation/leisure participation, Balance/Vestibular training, functional mobility, therapeutic activities, UE/LE strength/coordination, community reintegration, pt/family education, adaptive equipment instruction/use, discharge planning, psychosocial support   SW/CM Interventions Discharge Planning, Psychosocial Support, Patient/Family Education    Team Discharge Planning: Destination: PT-Assisted Living (home vs ALF tbd) ,OT- Assisted Living , SLP-Home Projected Follow-up: PT-24 hour supervision/assistance, Home health PT, OT-  Home health OT, Outpatient OT, SLP-Other (comment) (TBD, do not anticipate ST needs at discharge. ) Projected Equipment Needs: PT-To be determined, OT- To be determined, SLP-None recommended by SLP Equipment Details: PT- , OT-  Patient/family involved in discharge planning: PT- Patient,  OT-Patient, Family member/caregiver, SLP-Patient, Family member/caregiver  MD ELOS: 15-20  days Medical Rehab Prognosis:  Excellent Assessment: The patient has been admitted for CIR therapies with the diagnosis of left CVA. The team will be addressing functional mobility, strength, stamina, balance, safety, adaptive techniques and equipment, self-care, bowel and bladder mgt, patient and caregiver education, NMR, cognition, commuication, stroke education, community reintegration. Goals have been set at supervision to  mod I with mobility and self-care and mod I with cognition/communication.    Ranelle Oyster, MD, FAAPMR      See Team Conference Notes for weekly updates to the plan of care

## 2015-05-08 NOTE — Progress Notes (Signed)
Occupational Therapy Note  Patient Details  Name: Anne Herrera MRN: [id number] Date of Birth: 1935/12/04  Today's Date: 05/08/2015 OT Individual Time: 9136-8599 OT Individual Time Calculation (min): 30 min   Pt seen this session for RUE NMR with A/AROM of entire arm in multiple planes of movement including wrist/ finger extension. Pt sat on EOB and did well working with all of the exercises.  NT needed pt to stand for BP reading, pt stood with min A in steady. At end of session, completed a squat pivot to the L to w/c with min A to guide pt forward. Pt pivoted her hips well. Pt's lunch tray arrived and pt set up with all needs met.   [place] 05/08/2015, 8:49 AM

## 2015-05-08 NOTE — Progress Notes (Signed)
Physical Therapy Session Note  Patient Details  Name: Anne Herrera MRN: [id number] Date of Birth: Sep 30, 1935  Today's Date: 05/08/2015 PT Individual Time: 0830-0930 PT Individual Time Calculation (min): 60 min   Short Term Goals: Week 1:  PT Short Term Goal 1 (Week 1): Pt will roll L in bed req min A.  PT Short Term Goal 2 (Week 1): Pt will demonstrate side lie to sit transfer req min A.  PT Short Term Goal 3 (Week 1): Pt will demonstrate sit to stand req mod A, consistently.  PT Short Term Goal 4 (Week 1): Pt will ambulate with one person assist using HW or RW.  PT Short Term Goal 5 (Week 1): Pt will ascend/descend 2 steps with B rails req mod A.   Skilled Therapeutic Interventions/Progress Updates:    Pt received up in recliner, taking meds from RN. PT places socks and shoes on patient. Therapeutic Activity - see function tab for bed mobility and transfer status. PT gives pt weightbearing facilitation through R UE  During scooting on eob of mat and with STS, as well as weightbearing facilitation through distal thigh during STS and verbal cues to lean forward. W/C Management - see function tab for w/c propulsion with B LEs - pt has significant difficulty and takes increased time self progressing R leg during w/c propulsion and req 3 breaks to scoot hips back in w/c. Gait Training - see function tab for ambulation on level surface and stairs. Pt practices stairs in step-to manner x 2 reps - once up forward with strong and down with weak backwards; then up forward with weak and down with strong backwards. On level surface, PT facilitates L lateral weight shift to offload R LE to facilate swing phase - pt able to self progress R LE with increased time and no evidence of R knee buckling in stance phase. Pt is continuing to slowly progress with functional mobility, but has very low activity tolerance. Continue per PT POC.   Therapy Documentation Precautions:  Precautions Precautions:  Fall Precaution Comments: Fell at [facility] IR while being assisted to toilet by RN Tech (w/o device) Restrictions Weight Bearing Restrictions: No Pain: Pain Assessment Pain Assessment: No/denies pain   See Function Navigator for Current Functional Status.   Therapy/Group: Individual Therapy  Melita Villalona M 05/08/2015, 9:23 AM

## 2015-05-08 NOTE — Progress Notes (Signed)
Subjective/Complaints: 79 y.o. Left handed female with history of HTN, DM type 2, OSA, left lacunar infarct 04/29/15 who was discharged to home on 9/01 but readmitted to [facility] on 09/02 with slurred speech, inability to use RUE as well as RLE weakness with difficulty walking.  MRI brain done revealing slightly enlarged left posterior putamen and left posterior CR infarct. TEE without evidence of thrombus and 30 day event monitor was placed.  She was maintained on ASA 81 mg daily for secondary stroke prevention and permissive hypertension to help with perfusion  Some dizziness with standing yesterday, no pain Hx gout but no attacks for 1 yr, uses pickeled okra for attacks ROS- freq urination, , no pain c/os, no swallowing c/os, no SOB or CP  Objective: Vital Signs: Blood pressure 171/77, pulse 78, temperature 98.5 F (36.9 C), temperature source Oral, resp. rate 18, height 5' 4"  (1.626 m), weight 84.369 kg (186 lb), SpO2 99 %. No results found. Results for orders placed or performed during the hospital encounter of 05/06/15 (from the past 72 hour(s))  Glucose, capillary     Status: Abnormal   Collection Time: 05/06/15  4:39 PM  Result Value Ref Range   Glucose-Capillary 133 (H) 65 - 99 mg/dL  Glucose, capillary     Status: Abnormal   Collection Time: 05/06/15  9:01 PM  Result Value Ref Range   Glucose-Capillary 170 (H) 65 - 99 mg/dL   Comment 1 Notify RN   Glucose, capillary     Status: Abnormal   Collection Time: 05/07/15  6:17 AM  Result Value Ref Range   Glucose-Capillary 149 (H) 65 - 99 mg/dL   Comment 1 Notify RN   Comprehensive metabolic panel     Status: Abnormal   Collection Time: 05/07/15  7:53 AM  Result Value Ref Range   Sodium 134 (L) 135 - 145 mmol/L   Potassium 3.3 (L) 3.5 - 5.1 mmol/L   Chloride 98 (L) 101 - 111 mmol/L   CO2 25 22 - 32 mmol/L   Glucose, Bld 160 (H) 65 - 99 mg/dL   BUN 16 6 - 20 mg/dL   Creatinine, Ser 0.84 0.44 - 1.00 mg/dL   Calcium 9.1 8.9 - 10.3  mg/dL   Total Protein 7.3 6.5 - 8.1 g/dL   Albumin 3.6 3.5 - 5.0 g/dL   AST 27 15 - 41 U/L   ALT 31 14 - 54 U/L   Alkaline Phosphatase 87 38 - 126 U/L   Total Bilirubin 0.7 0.3 - 1.2 mg/dL   GFR calc non Af Amer >60 >60 mL/min   GFR calc Af Amer >60 >60 mL/min    Comment: (NOTE) The eGFR has been calculated using the CKD EPI equation. This calculation has not been validated in all clinical situations. eGFR's persistently <60 mL/min signify possible Chronic Kidney Disease.    Anion gap 11 5 - 15  CBC WITH DIFFERENTIAL     Status: Abnormal   Collection Time: 05/07/15  7:53 AM  Result Value Ref Range   WBC 7.2 4.0 - 10.5 K/uL   RBC 4.90 3.87 - 5.11 MIL/uL   Hemoglobin 11.2 (L) 12.0 - 15.0 g/dL   HCT 36.0 36.0 - 46.0 %   MCV 73.5 (L) 78.0 - 100.0 fL   MCH 22.9 (L) 26.0 - 34.0 pg   MCHC 31.1 30.0 - 36.0 g/dL   RDW 15.3 11.5 - 15.5 %   Platelets 291 150 - 400 K/uL   Neutrophils Relative % 58 43 -  77 %   Neutro Abs 4.2 1.7 - 7.7 K/uL   Lymphocytes Relative 32 12 - 46 %   Lymphs Abs 2.3 0.7 - 4.0 K/uL   Monocytes Relative 7 3 - 12 %   Monocytes Absolute 0.5 0.1 - 1.0 K/uL   Eosinophils Relative 2 0 - 5 %   Eosinophils Absolute 0.2 0.0 - 0.7 K/uL   Basophils Relative 1 0 - 1 %   Basophils Absolute 0.0 0.0 - 0.1 K/uL  Glucose, capillary     Status: Abnormal   Collection Time: 05/07/15 11:36 AM  Result Value Ref Range   Glucose-Capillary 109 (H) 65 - 99 mg/dL  Glucose, capillary     Status: Abnormal   Collection Time: 05/07/15  4:29 PM  Result Value Ref Range   Glucose-Capillary 101 (H) 65 - 99 mg/dL  Glucose, capillary     Status: Abnormal   Collection Time: 05/07/15  9:01 PM  Result Value Ref Range   Glucose-Capillary 120 (H) 65 - 99 mg/dL   Comment 1 Notify RN   Glucose, capillary     Status: Abnormal   Collection Time: 05/08/15  6:43 AM  Result Value Ref Range   Glucose-Capillary 139 (H) 65 - 99 mg/dL   Comment 1 Notify RN      HEENT: normal and no  strabismus Cardio: RRR and no murmur Resp: CTA B/L GI: BS positive and NT, ND Extremity:  No Edema Skin:   Other psoriatic patch bilateral pre patellar Neuro: Alert/Oriented, Cranial Nerve Abnormalities right central 7, Abnormal Sensory Right side feels funny vs left side but able to identify LT on R side, Abnormal Motor 2- R delt , bi, tri, grip, HF, KE, ADF, Abnormal FMC Ataxic/ dec FMC and Dysarthric Musc/Skel:  Other mild Right elbow flexion contracture Gen NAD   Assessment/Plan: 1. 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 Physiatrist is providing close team supervision and 24 hour management of active medical problems listed below. Physiatrist and rehab team continue to assess barriers to discharge/monitor patient progress toward functional and medical goals. FIM: Function - Bathing Position: Standing at sink Body parts bathed by patient: Left arm, Chest, Abdomen, Front perineal area, Buttocks, Right upper leg, Left upper leg Bathing not applicable: Buttocks, Left lower leg, Right lower leg, Right arm  Function- Upper Body Dressing/Undressing What is the patient wearing?: Pull over shirt/dress Pull over shirt/dress - Perfomed by patient: Thread/unthread right sleeve, Thread/unthread left sleeve, Put head through opening, Pull shirt over trunk Assist Level: Supervision or verbal cues Function - Lower Body Dressing/Undressing What is the patient wearing?: Underwear, Pants, Socks Position: Wheelchair/chair at sink Underwear - Performed by helper: Thread/unthread right underwear leg, Thread/unthread left underwear leg, Pull underwear up/down Pants- Performed by helper: Thread/unthread right pants leg, Thread/unthread left pants leg, Pull pants up/down, Fasten/unfasten pants Socks - Performed by helper: Don/doff right sock, Don/doff  left sock  Function - Toileting Toileting steps completed by patient: Adjust clothing prior to toileting, Adjust clothing after toileting Toileting steps completed by helper: Adjust clothing prior to toileting, Adjust clothing after toileting Toileting Assistive Devices: Grab bar or rail Assist level: Touching or steadying assistance (Pt.75%)  Function - Air cabin crew transfer assistive device: Elevated toilet seat/BSC over toilet, Grab bar Mechanical lift: Stedy Assist level to toilet: Maximal assist (Pt 25 - 49%/lift and lower) Assist level from toilet: Maximal assist (Pt  25 - 49%/lift and lower)  Function - Chair/bed transfer Chair/bed transfer method: Stand pivot Chair/bed transfer assist level: Maximal assist (Pt 25 - 49%/lift and lower) Chair/bed transfer assistive device: Armrests Chair/bed transfer details: Manual facilitation for placement, Manual facilitation for weight bearing, Verbal cues for sequencing, Verbal cues for technique, Verbal cues for safe use of DME/AE  Function - Locomotion: Wheelchair Will patient use wheelchair at discharge?: Yes Type: Manual Max wheelchair distance: 55 Assist Level: Supervision or verbal cues Assist Level: Supervision or verbal cues Wheel 150 feet activity did not occur: Safety/medical concerns (low endurance - unable to complete) Function - Locomotion: Ambulation Assistive device: Rail in hallway Max distance: 30 Assist level: 2 helpers Assist level: 2 helpers Walk 50 feet with 2 turns activity did not occur: Safety/medical concerns (low endurance - unable) Walk 150 feet activity did not occur: Safety/medical concerns (low endurance - unable) Walk 10 feet on uneven surfaces activity did not occur: Safety/medical concerns (R LE too unstable - unsafe)  Function - Comprehension Comprehension: Auditory Comprehension assist level: Follows basic conversation/direction with no assist  Function - Expression Expression:  Verbal Expression assist level: Expresses complex ideas: With extra time/assistive device  Function - Social Interaction Social Interaction assist level: Interacts appropriately with others with medication or extra time (anti-anxiety, antidepressant).  Function - Problem Solving Problem solving assist level: Solves basic 90% of the time/requires cueing < 10% of the time  Function - Memory Memory assist level: More than reasonable amount of time Patient normally able to recall (first 3 days only): Current season, Location of own room, Staff names and faces, That he or she is in a hospital  Medical Problem List and Plan: 1. 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 DVT Prophylaxis/Anticoagulation: Pharmaceutical: Lovenox 3. Pain Management: Tylenol prn pain. 4. Mood: LCSW to follow for evaluation and support.  5. Neuropsych: This patient is capable of making decisions on her own behalf. 6. Skin/Wound Care: Routine pressure relief measures.  7. Fluids/Electrolytes/Nutrition: Monitor I/O. Check lytes in am. Offer supplements if intake poor.  8. HTN: Monitor bid. Continue coreg bid. Off HCTZ at this time. BP 171/77 , given extension of CVA, avoid hypotension, check for orthostatic changes 9. DM type 2: Monitor BS ac/hs. Continue glipizide and metformin. Use SSI for elevated BS and adjust medications as indicated. Serum glucose 139 this am 10. H/o Gout: Controlled off medications. Monitor for joint pain and swelling 11.  Psoriasis, local tx, has triamcinolone cream ordered LOS (Days) 2 A FACE TO FACE EVALUATION WAS PERFORMED  KIRSTEINS,ANDREW E 05/08/2015, 7:14 AM

## 2015-05-08 NOTE — Progress Notes (Signed)
Occupational Therapy Session Note  Patient Details  Name: Anne Herrera MRN: [id number] Date of Birth: 1936/02/11  Today's Date: 05/08/2015 OT Individual Time: 0930-1100 OT Individual Time Calculation (min): 90 min   Short Term Goals: Week 1:  OT Short Term Goal 1 (Week 1): Pt will transfer to/from toilet with Mod A OT Short Term Goal 2 (Week 1): Pt will complete toileting with Min A for thoroughness OT Short Term Goal 3 (Week 1): Pt will bathe 10 of 10 body parts with Min A OT Short Term Goal 4 (Week 1): Pt will dress lower body with Mod A using AE  Skilled Therapeutic Interventions/Progress Updates: ADL-retraining at shower level with focus on improved transfers, adapted bathing/dressing skills using AE (wash mitt and reacher), and supported standing balance.   Pt completes squat pivot transfer from w/c to tub bench with facilitation to weight shift and place RLE .   Pt bathes seated at bench using RW to incorporate RUE with overall mod assist to wash peri-area (with RN tech assist), buttocks, back and lower legs.    Pt sustains static standing balance using grab bar as therapist assist with washing buttocks.   Pt dresses in w/c lacing pants and standing at bed supported by bed rails to pull up her pants with min vc for technique.   Pt requires assist to don shoes and socks.   Pt grooms at sink: Hydrologist alerted to need for setup after pt finishes grooming.     Therapy Documentation Precautions:  Precautions Precautions: Fall Precaution Comments: Fell at [facility] IR while being assisted to toilet by RN Tech (w/o device) Restrictions Weight Bearing Restrictions: No  Pain: Pain Assessment Pain Assessment: No/denies pain  ADL: ADL ADL Comments: see Functional Assessment   See Function Navigator for Current Functional Status.   Therapy/Group: Individual Therapy  Tiffany Calmes 05/08/2015, 12:51 PM

## 2015-05-08 NOTE — Progress Notes (Signed)
Speech Language Pathology Daily Session Note  Patient Details  Name: Anne Herrera MRN: [id number] Date of Birth: 03/15/1936  Today's Date: 05/08/2015 SLP Individual Time: 1403-1430 SLP Individual Time Calculation (min): 27 min  Short Term Goals: Week 1: SLP Short Term Goal 1 (Week 1): STG=LTG due to ELOS for ST  Skilled Therapeutic Interventions:  Pt was seen for skilled ST targeting dysarthria goals.  Upon arrival, pt was seated upright in recliner, awake, lethargic but agreeable to participate in ST.  Pt reported feeling that her speech had improved from yesterday.  SLP reviewed and reinforced compensatory dysarthria strategies with use of handout provided yesterday.  Pt utilized dysarthria strategies in a structured barrier task with supervision-mod I.  Pt was left in recliner with all needs left within reach.  Continue per current plan of care.    Function:  Eating Eating       Cognition Comprehension Comprehension assist level: Follows basic conversation/direction with no assist  Expression   Expression assist level: Expresses basic needs/ideas: With extra time/assistive device  Social Interaction Social Interaction assist level: Interacts appropriately with others with medication or extra time (anti-anxiety, antidepressant).  Problem Solving Problem solving assist level: Solves basic 90% of the time/requires cueing < 10% of the time  Memory Memory assist level: More than reasonable amount of time    Pain Pain Assessment Pain Assessment: No/denies pain  Therapy/Group: Individual Therapy  Chae Shuster, Melanee Spry 05/08/2015, 4:44 PM

## 2015-05-09 ENCOUNTER — Inpatient Hospital Stay (HOSPITAL_COMMUNITY): Payer: Medicare PPO | Admitting: Physical Therapy

## 2015-05-09 ENCOUNTER — Inpatient Hospital Stay (HOSPITAL_COMMUNITY): Payer: Medicare PPO | Admitting: Occupational Therapy

## 2015-05-09 ENCOUNTER — Encounter (HOSPITAL_COMMUNITY): Payer: Medicare PPO | Admitting: Occupational Therapy

## 2015-05-09 LAB — GLUCOSE, CAPILLARY
GLUCOSE-CAPILLARY: 81 mg/dL (ref 65–99)
Glucose-Capillary: 129 mg/dL — ABNORMAL HIGH (ref 65–99)
Glucose-Capillary: 95 mg/dL (ref 65–99)

## 2015-05-09 MED ORDER — LACTASE 3000 UNITS PO TABS
6000.0000 [IU] | ORAL_TABLET | Freq: Three times a day (TID) | ORAL | Status: DC
  Administered 2015-05-09 – 2015-05-13 (×14): 6000 [IU] via ORAL
  Filled 2015-05-09 (×18): qty 2

## 2015-05-09 NOTE — Progress Notes (Signed)
Physical Therapy Note  Patient Details  Name: Anne Herrera MRN: [id number] Date of Birth: 1935/12/21 Today's Date: 05/09/2015    Time: 1030-1110 40 minutes  1:1  No c/o pain.  Gait with RW with R wrist splint multiple attempts 25-35' with mod A for wt shifts, tactile cues for R LE advancement.  Pt improves with tactile cues at hip flexors to assist with advancing R LE.  Modified plantigrade with focus on wt shifting onto R side.  Seated NMR for R UE per pt request.  Tactile cues for elbow extension in various positions.  Pt very motivated to improve.   DONAWERTH,KAREN 05/09/2015, 11:10 AM

## 2015-05-09 NOTE — Progress Notes (Signed)
Pt stated that she would self administer CPAP when ready for bed.  Current settings are Auto CPAP 6-20 CMH20.  Pt to notify RT if any assistance is required throughout the night. RT to monitor and assess as needed.

## 2015-05-09 NOTE — Progress Notes (Signed)
Anne Herrera is a 79 y.o. female [birth year] [id number]  Subjective: C/o loose stools after meals. She is lactose-intolerant. No new problems. Slept well. Feeling OK.  Objective: Vital signs in last 24 hours: Temp:  [97.9 F (36.6 C)-98.5 F (36.9 C)] 97.9 F (36.6 C) (09/10 0629) Pulse Rate:  [66-76] 66 (09/10 0629) Resp:  [16-20] 20 (09/10 0629) BP: (149-180)/(62-79) 149/62 mmHg (09/10 0629) SpO2:  [98 %-99 %] 99 % (09/10 0629) Weight change:  Last BM Date: 05/08/15  Intake/Output from previous day: 09/09 0701 - 09/10 0700 In: 720 [P.O.:720] Out: -  Last cbgs: CBG (last 3)   Recent Labs  05/08/15 1134 05/08/15 1651 05/08/15 2038  GLUCAP 95 100* 122*     Physical Exam General: No apparent distress   HEENT: not dry Lungs: Normal effort. Lungs clear to auscultation, no crackles or wheezes. Cardiovascular: Regular rate and rhythm, no edema Abdomen: S/NT/ND; BS(+) Musculoskeletal:  unchanged Neurological: No new neurological deficits Wounds: N/A    Skin: clear  Aging changes Mental state: Alert, oriented, cooperative    Lab Results: BMET    Component Value Date/Time   NA 134* 05/07/2015 0753   K 3.3* 05/07/2015 0753   CL 98* 05/07/2015 0753   CO2 25 05/07/2015 0753   GLUCOSE 160* 05/07/2015 0753   BUN 16 05/07/2015 0753   CREATININE 0.84 05/07/2015 0753   CALCIUM 9.1 05/07/2015 0753   GFRNONAA >60 05/07/2015 0753   GFRAA >60 05/07/2015 0753   CBC    Component Value Date/Time   WBC 7.2 05/07/2015 0753   RBC 4.90 05/07/2015 0753   HGB 11.2* 05/07/2015 0753   HCT 36.0 05/07/2015 0753   PLT 291 05/07/2015 0753   MCV 73.5* 05/07/2015 0753   MCH 22.9* 05/07/2015 0753   MCHC 31.1 05/07/2015 0753   RDW 15.3 05/07/2015 0753   LYMPHSABS 2.3 05/07/2015 0753   MONOABS 0.5 05/07/2015 0753   EOSABS 0.2 05/07/2015 0753   BASOSABS 0.0 05/07/2015 0753    Studies/Results: No results found.  Medications: I have reviewed the patient's current  medications.  Assessment/Plan:  1. 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 DVT Prophylaxis/Anticoagulation: Pharmaceutical: Lovenox 3. Pain Management: Tylenol prn pain. 4. Mood: LCSW to follow for evaluation and support.  5. Neuropsych: This patient is capable of making decisions on her own behalf. 6. Skin/Wound Care: Routine pressure relief measures.  7. Fluids/Electrolytes/Nutrition: Monitor I/O. Check lytes in am. Offer supplements if intake poor.  8. HTN: Monitor bid. Continue coreg bid. Off HCTZ at this time. BP 171/77 , given extension of CVA, avoid hypotension, check for orthostatic changes 9. DM type 2: Monitor BS ac/hs. Continue glipizide and metformin. Use SSI for elevated BS and adjust medications as indicated. Serum glucose 139 this am 10. H/o Gout: Controlled off medications. Monitor for joint pain and swelling 11. Psoriasis, local tx, has triamcinolone cream ordered 12. Loose stools. Will try lactaid w/meals    Length of stay, days: 3  Sonda Primes , MD 05/09/2015, 9:00 AM

## 2015-05-10 ENCOUNTER — Inpatient Hospital Stay (HOSPITAL_COMMUNITY): Payer: Medicare PPO

## 2015-05-10 ENCOUNTER — Inpatient Hospital Stay (HOSPITAL_COMMUNITY): Payer: Medicare PPO | Admitting: Physical Therapy

## 2015-05-10 DIAGNOSIS — R197 Diarrhea, unspecified: Secondary | ICD-10-CM

## 2015-05-10 LAB — GLUCOSE, CAPILLARY
GLUCOSE-CAPILLARY: 112 mg/dL — AB (ref 65–99)
GLUCOSE-CAPILLARY: 92 mg/dL (ref 65–99)
Glucose-Capillary: 104 mg/dL — ABNORMAL HIGH (ref 65–99)
Glucose-Capillary: 106 mg/dL — ABNORMAL HIGH (ref 65–99)

## 2015-05-10 NOTE — Progress Notes (Signed)
Physical Therapy Session Note  Patient Details  Name: Anne Herrera MRN: [id number] Date of Birth: Apr 21, 1936  Today's Date: 05/10/2015 PT Individual Time: 0800-0900 PT Individual Time Calculation (min): 60 min   Short Term Goals: Week 1:  PT Short Term Goal 1 (Week 1): Pt will roll L in bed req min A.  PT Short Term Goal 2 (Week 1): Pt will demonstrate side lie to sit transfer req min A.  PT Short Term Goal 3 (Week 1): Pt will demonstrate sit to stand req mod A, consistently.  PT Short Term Goal 4 (Week 1): Pt will ambulate with one person assist using HW or RW.  PT Short Term Goal 5 (Week 1): Pt will ascend/descend 2 steps with B rails req mod A.   Skilled Therapeutic Interventions/Progress Updates:   Pt demonstrates improvement initiating gait without AD. Pt limited in part by limited core activation, particularly R L/S lateral flexors. Pt benefits from cues to address this. Pt able to partially carryover this education later in session. Pt would continue to benefit from skilled PT services to increase functional mobility.  Therapy Documentation Precautions:  Precautions Precautions: Fall Precaution Comments: Fell at [facility] [place] IR while being assisted to toilet by [employer] (w/o device) Restrictions Weight Bearing Restrictions: No Vital Signs: Therapy Vitals Temp: 98.2 F (36.8 C) Temp Source: Oral Pulse Rate: 66 Resp: 18 BP: (!) 154/81 mmHg Patient Position (if appropriate): Sitting Oxygen Therapy SpO2: 98 % O2 Device: Not Delivered Pain: Pain Assessment Pain Assessment: No/denies pain Mobility:  Transfers Mod A with cues for weight shift, technique, and LE/hand placement Locomotion :   Max A becoming Mod A with +2 for w/c follow free gait with cues for core activation, posture, sequencing  Other Treatments:  TX 1: Pt educated on rehab plan, safety in mobility, and forced use. Pt performs anterior pelvic tilts, LUE reaching with R upward pelvic tilts 3x10. Transfers x  14 in session. Pre-gait 2x10 BLE advancement with cues for R lateral L/S flexors, sequencing, and posture. L/S soft tissue stretches in all planes, T/S soft tissue stretch into ext 3x30". Static standing 1'x3. Pre-gait with B/L weight shifting 2x10.   Tx 2: Pt performs toe taps on 6" curb without UE support 3x5 LLE up. Pt performs standing balance 1'x2 in session. Free gait as above. Transfers x6 in session. Pt performs RUE resisted distal movements of elbow ext. Pt repositioned at end of session in neutral alignment. Pt educated on safety in mobility and role of weight shift. R upward tilt 2x10.  Tx 3: Pt educated on weight shift, safety, and UE weight bearing. UE weight bearing in sitting as active rest in session. RUE weight bearing with LUE elevation 4x10. Scap mobs in all directions. Shoulder flexion, horiz add/abd with scap motions with scap mobs. Transfers x5 in session. Partial sit to stands 3x5 with RUE forced use. Holds of 1/2 stand 10"x5. Supine shoulder flex, horiz add 2x10. D2 modified extremity PNF with resisted concentrics 2x10.    See Function Navigator for Current Functional Status.   Therapy/Group: Individual Therapy  Christia Reading 05/10/2015, 8:38 AM

## 2015-05-10 NOTE — Progress Notes (Signed)
Placed patient on CPAP for the night.  Patient is tolerating well at this time. 

## 2015-05-10 NOTE — Progress Notes (Signed)
Anne Herrera is a 79 y.o. female [birth year] [id number]  Subjective: F/u loose stools after meals -better. She is lactose-intolerant. No new problems. Slept well. Feeling OK.  Objective: Vital signs in last 24 hours: Temp:  [98.2 F (36.8 C)] 98.2 F (36.8 C) (09/11 0532) Pulse Rate:  [61-81] 66 (09/11 0837) Resp:  [18-20] 18 (09/11 0532) BP: (154-162)/(65-81) 154/81 mmHg (09/11 0837) SpO2:  [97 %-100 %] 98 % (09/11 0532) Weight change:  Last BM Date: 05/09/15  Intake/Output from previous day: 09/10 0701 - 09/11 0700 In: 840 [P.O.:840] Out: -  Last cbgs: CBG (last 3)   Recent Labs  05/09/15 1625 05/09/15 2052 05/10/15 0604  GLUCAP 81 95 112*     Physical Exam General: No apparent distress   HEENT: not dry Lungs: Normal effort. Lungs clear to auscultation, no crackles or wheezes. Cardiovascular: Regular rate and rhythm, no edema Abdomen: S/NT/ND; BS(+) Musculoskeletal:  unchanged Neurological: No new neurological deficits Wounds: N/A    Skin: clear  Aging changes Mental state: Alert, oriented, cooperative    Lab Results: BMET    Component Value Date/Time   NA 134* 05/07/2015 0753   K 3.3* 05/07/2015 0753   CL 98* 05/07/2015 0753   CO2 25 05/07/2015 0753   GLUCOSE 160* 05/07/2015 0753   BUN 16 05/07/2015 0753   CREATININE 0.84 05/07/2015 0753   CALCIUM 9.1 05/07/2015 0753   GFRNONAA >60 05/07/2015 0753   GFRAA >60 05/07/2015 0753   CBC    Component Value Date/Time   WBC 7.2 05/07/2015 0753   RBC 4.90 05/07/2015 0753   HGB 11.2* 05/07/2015 0753   HCT 36.0 05/07/2015 0753   PLT 291 05/07/2015 0753   MCV 73.5* 05/07/2015 0753   MCH 22.9* 05/07/2015 0753   MCHC 31.1 05/07/2015 0753   RDW 15.3 05/07/2015 0753   LYMPHSABS 2.3 05/07/2015 0753   MONOABS 0.5 05/07/2015 0753   EOSABS 0.2 05/07/2015 0753   BASOSABS 0.0 05/07/2015 0753    Studies/Results: No results found.  Medications: I have reviewed the patient's current  medications.  Assessment/Plan:  1. 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 DVT Prophylaxis/Anticoagulation: Pharmaceutical: Lovenox 3. Pain Management: Tylenol prn pain. 4. Mood: LCSW to follow for evaluation and support.  5. Neuropsych: This patient is capable of making decisions on her own behalf. 6. Skin/Wound Care: Routine pressure relief measures.  7. Fluids/Electrolytes/Nutrition: Monitor I/O. Check lytes in am. Offer supplements if intake poor.  8. HTN: Monitor bid. Continue coreg bid. Off HCTZ at this time. BP 171/77 , given extension of CVA, avoid hypotension, check for orthostatic changes 9. DM type 2: Monitor BS ac/hs. Continue glipizide and metformin. Use SSI for elevated BS and adjust medications as indicated. Serum glucose 139 this am 10. H/o Gout: Controlled off medications. Monitor for joint pain and swelling 11. Psoriasis, local tx, has triamcinolone cream ordered 12. Loose stools. Seems better on lactaid w/meals    Length of stay, days: 4  Sonda Primes , MD 05/10/2015, 8:55 AM

## 2015-05-10 NOTE — Progress Notes (Signed)
Occupational Therapy Session Note  Patient Details  Name: Anne Herrera MRN: [id number] Date of Birth: Jan 12, 1936  Today's Date: 05/10/2015 OT Individual Time: 0930-1030 OT Individual Time Calculation (min): 60 min    Short Term Goals: Week 1:  OT Short Term Goal 1 (Week 1): Pt will transfer to/from toilet with Mod A OT Short Term Goal 2 (Week 1): Pt will complete toileting with Min A for thoroughness OT Short Term Goal 3 (Week 1): Pt will bathe 10 of 10 body parts with Min A OT Short Term Goal 4 (Week 1): Pt will dress lower body with Mod A using AE  Skilled Therapeutic Interventions/Progress Updates: ADL-retraining at shower level with focus on improved functional use of RUE, transfers, AE re-training, and adapted dressing skills.   Pt received in w/c, daughter arriving shortly after session begins.   OT educates daughter on pt's progress and role of therapy with facilitating weight-shifting, weight-bearing, and NMR of R-U/LE while pt participates in BADL.   Pt completes SPT using RW with vc for technique and mod assist to lift.   Pt bathes with steadying assist to maintain standing balance as therapist assist with washing buttocks.  Pt educated on use of LH sponge to wash feet/back.    Pt dresses and requires re-ed on use of reacher to don underwear and pants as she is challenged by hypertonicity at R hip and knee, provoking extensor synergy pattern.   Pt don underwear/pants standing at RW with steadying assist to maintain balance and min assist to hike pants over right hip completely.   Pt required total assist with donning socks and shoes; OT discussed use of slip-on shoes to simplify process.   Pt left with daughter to assist grooming at sink with pt in w/c.     Therapy Documentation Precautions:  Precautions Precautions: Fall Precaution Comments: Fell at [facility] IR while being assisted to toilet by RN Tech (w/o device) Restrictions Weight Bearing Restrictions: No  Vital  Signs: Therapy Vitals Pulse Rate: 66 BP: (!) 154/81 mmHg   Pain: Pain Assessment Pain Assessment: No/denies pain  ADL: ADL ADL Comments: see Functional Assessment Exercises:   Other Treatments:    See Function Navigator for Current Functional Status.   Therapy/Group: Individual Therapy  Juriel Cid 05/10/2015, 10:57 AM

## 2015-05-11 ENCOUNTER — Inpatient Hospital Stay (HOSPITAL_COMMUNITY): Payer: Medicare PPO | Admitting: Speech Pathology

## 2015-05-11 ENCOUNTER — Inpatient Hospital Stay (HOSPITAL_COMMUNITY): Payer: Medicare PPO | Admitting: Physical Therapy

## 2015-05-11 ENCOUNTER — Inpatient Hospital Stay (HOSPITAL_COMMUNITY): Payer: Medicare PPO | Admitting: Occupational Therapy

## 2015-05-11 ENCOUNTER — Inpatient Hospital Stay (HOSPITAL_COMMUNITY): Payer: Medicare PPO

## 2015-05-11 LAB — GLUCOSE, CAPILLARY
GLUCOSE-CAPILLARY: 93 mg/dL (ref 65–99)
GLUCOSE-CAPILLARY: 128 mg/dL — AB (ref 65–99)
Glucose-Capillary: 133 mg/dL — ABNORMAL HIGH (ref 65–99)
Glucose-Capillary: 82 mg/dL (ref 65–99)
Glucose-Capillary: 69 mg/dL (ref 65–99)

## 2015-05-11 LAB — BASIC METABOLIC PANEL
ANION GAP: 8 (ref 5–15)
BUN: 15 mg/dL (ref 6–20)
CALCIUM: 9.1 mg/dL (ref 8.9–10.3)
CO2: 24 mmol/L (ref 22–32)
CREATININE: 0.85 mg/dL (ref 0.44–1.00)
Chloride: 104 mmol/L (ref 101–111)
GFR calc Af Amer: >60 mL/min (ref >60)
GLUCOSE: 133 mg/dL — AB (ref 65–99)
Potassium: 3.7 mmol/L (ref 3.5–5.1)
Sodium: 136 mmol/L (ref 135–145)

## 2015-05-11 MED ORDER — CALCIUM POLYCARBOPHIL 625 MG PO TABS
625.0000 mg | ORAL_TABLET | Freq: Two times a day (BID) | ORAL | Status: DC
  Administered 2015-05-11 – 2015-05-22 (×22): 625 mg via ORAL
  Filled 2015-05-11 (×27): qty 1

## 2015-05-11 NOTE — Progress Notes (Signed)
Physical Therapy Session Note  Patient Details  Name: Anne Herrera MRN: [id number] Date of Birth: Oct 02, 1935  Today's Date: 05/11/2015 PT Individual Time: 1000-1100 PT Individual Time Calculation (min): 60 min   Short Term Goals: Week 1:  PT Short Term Goal 1 (Week 1): Pt will roll L in bed req min A.  PT Short Term Goal 2 (Week 1): Pt will demonstrate side lie to sit transfer req min A.  PT Short Term Goal 3 (Week 1): Pt will demonstrate sit to stand req mod A, consistently.  PT Short Term Goal 4 (Week 1): Pt will ambulate with one person assist using HW or RW.  PT Short Term Goal 5 (Week 1): Pt will ascend/descend 2 steps with B rails req mod A.   Skilled Therapeutic Interventions/Progress Updates:    Pt received seated in w/c with no c/o pain and agreeable to treatment.  W/c propulsion x150' with LUE and BLEs with supervision, increased time to perform. Gait for two trials of 30', initial trial with L handrail, second trial with RW and hand splint on RUE. Requires CGA overall, however occasional LOB requiring modA to maintain balance. Cues for hand placement in splint, and ace wrap dorsiflexion assist used during second trial to improve foot clearance and safety. LE taps to 2" step performed with minA for balance; pt unable to lift RLE to step due to hip flexor weakness. Performed LLE toe taps for 3 sets of 10 taps to improve RLE weight bearing, weight shifting, stance control and functional strength. Supine LE strengthening exercises including straight leg raise, heel slides, bridging. Pt returned to room and remained seated in w/c with all needs within reach at completion of session.   Therapy Documentation Precautions:  Precautions Precautions: Fall Precaution Comments: Fell at [facility] IR while being assisted to toilet by RN Tech (w/o device) Restrictions Weight Bearing Restrictions: No Pain: Pain Assessment Pain Assessment: No/denies pain Pain Score: 0-No pain   See Function  Navigator for Current Functional Status.   Therapy/Group: Individual Therapy  Vista Lawman 05/11/2015, 3:58 PM

## 2015-05-11 NOTE — Significant Event (Signed)
Hypoglycemic Event  CBG: 69  Treatment: 15 GM carbohydrate snack  Symptoms: None  Follow-up CBG: Time:1739 CBG Result:93  Possible Reasons for Event: Unknown  Comments/MD notified: Patient ate dinner and showed no s/s. Continue to monitor.    Kennyth Arnold G  Remember to initiate Hypoglycemia Order Set & complete

## 2015-05-11 NOTE — Progress Notes (Signed)
Speech Language Pathology Daily Session Note  Patient Details  Name: Anne Herrera MRN: [id number] Date of Birth: September 01, 1935  Today's Date: 05/11/2015 SLP Individual Time: 1500-1530 SLP Individual Time Calculation (min): 30 min  Short Term Goals: Week 1: SLP Short Term Goal 1 (Week 1): STG=LTG due to ELOS for ST  Skilled Therapeutic Interventions:  Pt was seen for skilled ST targeting dysarthria goals.  Upon arrival, pt was seated upright in recliner, awake, lethargic, but agreeable to participate in ST.  SLP facilitated the session with a verbal reasoning task targeting use of dysarthria strategies in connected speech.  Pt was mod I and 100% intelligible for use of dysarthria strategies during the abovementioned task.  She reports her speech has improved and that utilizing dysarthria strategies is not at all effortful.  All education is complete at this time.  Therefore, no further ST needs are indicated.    Function:  Eating Eating                 Cognition Comprehension Comprehension assist level: Follows complex conversation/direction with no assist  Expression   Expression assist level: Expresses basic needs/ideas: With extra time/assistive device  Social Interaction Social Interaction assist level: Interacts appropriately with others with medication or extra time (anti-anxiety, antidepressant).  Problem Solving Problem solving assist level: Solves basic 90% of the time/requires cueing < 10% of the time  Memory Memory assist level: More than reasonable amount of time    Pain Pain Assessment Pain Assessment: No/denies pain Pain Score: 0-No pain  Therapy/Group: Individual Therapy  Donn Wilmot, Melanee Spry 05/11/2015, 5:08 PM

## 2015-05-11 NOTE — Progress Notes (Signed)
Occupational Therapy Session Note  Patient Details  Name: Anne Herrera MRN: [id number] Date of Birth: 06/11/1936  Today's Date: 05/11/2015 OT Individual Time: 4098-1191 OT Individual Time Calculation (min): 60 min    Short Term Goals: Week 1:  OT Short Term Goal 1 (Week 1): Pt will transfer to/from toilet with Mod A OT Short Term Goal 2 (Week 1): Pt will complete toileting with Min A for thoroughness OT Short Term Goal 3 (Week 1): Pt will bathe 10 of 10 body parts with Min A OT Short Term Goal 4 (Week 1): Pt will dress lower body with Mod A using AE  Skilled Therapeutic Interventions/Progress Updates:  ADL-retraining with focus on improved transfers and effective use of AE.   Pt received in recliner and receptive for bathing/dressing.   OT educates pt on progression with transfers d/t poor technique with lateral scoot from recliner to w/c.   Pt completes stand-pivot transfer from w/c to tub bench with overall mod assist using RW.   Pt bathes using LH sponge as instructed to wash feet and back.   Pt returns to w/c after bathing using SPT technique again with only mod assist.   Pt dresses at w/c level, standing at RW with steadying assist and requiring min assist to hike underwear completely over right hip.   OT educates pt on alternate method, using lateral leans at EOB however pt prefers standing during this session.   Pt left in w/c at end of session with daughter present.     Therapy Documentation Precautions:  Precautions Precautions: Fall Precaution Comments: Fell at [facility] IR while being assisted to toilet by RN Tech (w/o device) Restrictions Weight Bearing Restrictions: No   Pain:No/denies pain   ADL: ADL ADL Comments: see Functional Assessment  See Function Navigator for Current Functional Status.   Therapy/Group: Individual Therapy  Hodari Chuba 05/11/2015, 3:01 PM

## 2015-05-11 NOTE — Progress Notes (Signed)
Pt is on CPAP at this time, pt is stable no complications noted. O2 saturations are acceptable at RA 99%. Pt tolerates well.

## 2015-05-11 NOTE — Progress Notes (Signed)
Recreational Therapy Assessment and Plan  Patient Details  Name: Anne Herrera MRN: [id number] Date of Birth: 11-Jan-1936 Today's Date: 05/11/2015  Rehab Potential: Good ELOS: 2 weeks   Assessment Clinical Impression:  Problem List:  Patient Active Problem List   Diagnosis Date Noted  . Spastic hemiplegia affecting right dominant side 05/07/2015  . 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 Lacunar stroke of left subthalamic region 05/06/2015  . TIA (transient ischemic attack) 04/28/2015    Past Medical History:  Past Medical History  Diagnosis Date  . Diabetes mellitus without complication   . Hypertension   . High cholesterol   . Gout    Past Surgical History:  Past Surgical History  Procedure Laterality Date  . Tubal ligation      Assessment & Plan Clinical Impression: Anne Herrera is a 79 y.o. Left handed female with history of HTN, DM type 2, OSA, left lacunar infarct 04/29/15 who was discharged to home on 9/01 but readmitted to [facility] on 09/02 with slurred speech, inability to use RUE as well as RLE weakness with difficulty walking. MRI brain done revealing slightly enlarged left posterior putamen and left posterior CR infarct. TEE without evidence of thrombus and 30 day event monitor was placed. She was maintained on ASA 81 mg daily for secondary stroke prevention and permissive hypertension to help with perfusion. She has had improvement in right sided weakness and dysarthria. Speech therapy has been working oropharyngeal dysphagia and diet has been advanced to regular textures. Therapy ongoing and CIR recommended by MD and Rehab team. Patient transferred to CIR on 05/06/2015.      Pt presents with decreased activity tolerance, decreased functional mobility, decreased balance, decreased coordination Limiting pt's independence with leisure/community pursuits.   Leisure History/Participation Premorbid leisure  interest/current participation: Sports - Exercise (Comment);Community - Doctor, hospital - Grocery store;Community - Travel (Comment);Community Administrator, sports (Comment) (silver sneakers program, works out at [employer], Youth worker for individuals in [place]) Expression Interests: Music (Comment) Other Leisure Interests: Television;Reading Leisure Participation Style: With Family/Friends Awareness of Community Resources: Excellent Psychosocial / Spiritual Social interaction - Mood/Behavior: Cooperative Academic librarian Appropriate for Education?: Yes Patient Agreeable to Outing?: Yes Recreational Therapy Orientation Orientation -Reviewed with patient: Available activity resources Strengths/Weaknesses Patient Strengths/Abilities: Willingness to participate;Active premorbidly Patient weaknesses: Physical limitations TR Patient demonstrates impairments in the following area(s): Endurance;Motor;Safety  Plan Rec Therapy Plan Is patient appropriate for Therapeutic Recreation?: Yes Rehab Potential: Good Treatment times per week: Min 1 time per week >20 minutes Estimated Length of Stay: 2 weeks TR Treatment/Interventions: Adaptive equipment instruction;1:1 session;Balance/vestibular training;Functional mobility training;Community reintegration;Patient/family education;Therapeutic activities;Recreation/leisure participation;Therapeutic exercise;UE/LE Coordination activities  Recommendations for other services: None  Discharge Criteria: Patient will be discharged from TR if patient refuses treatment 3 consecutive times without medical reason.  If treatment goals not met, if there is a change in medical status, if patient makes no progress towards goals or if patient is discharged from hospital.  The above assessment, treatment plan, treatment alternatives and goals were discussed and mutually agreed upon: by patient  [place] 05/11/2015, 11:42 AM

## 2015-05-11 NOTE — Progress Notes (Signed)
Occupational Therapy Session Note  Patient Details  Name: Maridee Slape MRN: [id number] Date of Birth: 1935/09/29  Today's Date: 05/11/2015 OT Individual Time: 1330-1430 OT Individual Time Calculation (min): 60 min    Short Term Goals: Week 1:  OT Short Term Goal 1 (Week 1): Pt will transfer to/from toilet with Mod A OT Short Term Goal 2 (Week 1): Pt will complete toileting with Min A for thoroughness OT Short Term Goal 3 (Week 1): Pt will bathe 10 of 10 body parts with Min A OT Short Term Goal 4 (Week 1): Pt will dress lower body with Mod A using AE  Skilled Therapeutic Interventions/Progress Updates:    Treatment session with focus on sit <> stand, stand pivot transfers, and RUE NMR.  Pt with heavy reliance on UE support with pulling up to standing with grab bar, steady assist sit > stand and with stand pivot to toilet.  Educated on improved hand placement with pushing up from w/c and holding onto grab bar or BSC with opposite hand, with pt hesitant to perform transfer in that manner and this time. Engaged in trunk control and reaching task in standing to carry over to self-care tasks with LB dressing and hygiene with pt requiring min assist for stability.  RUE NMR with focus on shoulder flexion and elbow extension with use of table top for support, progressed to reaching for items to provide visual stimulus to further elicit movement. Therapist provided tactile cues at trunk and shoulders to decrease compensatory movements.  Therapy Documentation Precautions:  Precautions Precautions: Fall Precaution Comments: Fell at [facility] IR while being assisted to toilet by RN Tech (w/o device) Restrictions Weight Bearing Restrictions: No Pain:  Pt with no c/o pain ADL: ADL ADL Comments: see Functional Assessment  See Function Navigator for Current Functional Status.   Therapy/Group: Individual Therapy  Rosalio Loud 05/11/2015, 3:23 PM

## 2015-05-11 NOTE — Progress Notes (Signed)
Subjective/Complaints:  No issues overnite, pt is c/o freq stools  ROS- freq urination, , no pain c/os, no swallowing c/os, no SOB or CP  Objective: Vital Signs: Blood pressure 154/71, pulse 63, temperature 98.2 F (36.8 C), temperature source Oral, resp. rate 17, height _0  (1.626 m), weight 84.369 kg (186 lb), SpO2 99 %. No results found. Results for orders placed or performed during the hospital encounter of 05/06/15 (from the past 72 hour(s))  Glucose, capillary     Status: None   Collection Time: 05/08/15 11:34 AM  Result Value Ref Range   Glucose-Capillary 95 65 - 99 mg/dL   Comment 1 Notify RN   Glucose, capillary     Status: Abnormal   Collection Time: 05/08/15  4:51 PM  Result Value Ref Range   Glucose-Capillary 100 (H) 65 - 99 mg/dL   Comment 1 Notify RN   Glucose, capillary     Status: Abnormal   Collection Time: 05/08/15  8:38 PM  Result Value Ref Range   Glucose-Capillary 122 (H) 65 - 99 mg/dL   Comment 1 Notify RN   Glucose, capillary     Status: Abnormal   Collection Time: 05/09/15 11:33 AM  Result Value Ref Range   Glucose-Capillary 129 (H) 65 - 99 mg/dL   Comment 1 Notify RN   Glucose, capillary     Status: None   Collection Time: 05/09/15  4:25 PM  Result Value Ref Range   Glucose-Capillary 81 65 - 99 mg/dL   Comment 1 Notify RN   Glucose, capillary     Status: None   Collection Time: 05/09/15  8:52 PM  Result Value Ref Range   Glucose-Capillary 95 65 - 99 mg/dL  Glucose, capillary     Status: Abnormal   Collection Time: 05/10/15  6:04 AM  Result Value Ref Range   Glucose-Capillary 112 (H) 65 - 99 mg/dL   Comment 1 Notify RN   Glucose, capillary     Status: Abnormal   Collection Time: 05/10/15 11:37 AM  Result Value Ref Range   Glucose-Capillary 104 (H) 65 - 99 mg/dL   Comment 1 Notify RN   Glucose, capillary     Status: None   Collection Time: 05/10/15  4:29 PM  Result Value Ref Range   Glucose-Capillary 92 65 - 99 mg/dL   Comment 1 Notify  RN   Glucose, capillary     Status: Abnormal   Collection Time: 05/10/15  9:01 PM  Result Value Ref Range   Glucose-Capillary 106 (H) 65 - 99 mg/dL  Basic metabolic panel     Status: Abnormal   Collection Time: 05/11/15  5:59 AM  Result Value Ref Range   Sodium 136 135 - 145 mmol/L   Potassium 3.7 3.5 - 5.1 mmol/L   Chloride 104 101 - 111 mmol/L   CO2 24 22 - 32 mmol/L   Glucose, Bld 133 (H) 65 - 99 mg/dL   BUN 15 6 - 20 mg/dL   Creatinine, Ser 0.85 0.44 - 1.00 mg/dL   Calcium 9.1 8.9 - 10.3 mg/dL   GFR calc non Af Amer >60 >60 mL/min   GFR calc Af Amer >60 >60 mL/min    Comment: (NOTE) The eGFR has been calculated using the CKD EPI equation. This calculation has not been validated in all clinical situations. eGFR's persistently <60 mL/min signify possible Chronic Kidney Disease.    Anion gap 8 5 - 15  Glucose, capillary     Status: Abnormal  Collection Time: 05/11/15  6:21 AM  Result Value Ref Range   Glucose-Capillary 133 (H) 65 - 99 mg/dL     HEENT: normal and no strabismus Cardio: RRR and no murmur Resp: CTA B/L GI: BS positive and NT, ND Extremity:  No Edema Skin:   Other psoriatic patch bilateral pre patellar Neuro: Alert/Oriented, Cranial Nerve Abnormalities right central 7, Abnormal Sensory  R side, Abnormal Motor 2- R delt , bi, tri, grip, HF, KE, ADF, Abnormal FMC Ataxic/ dec FMC and Dysarthric Musc/Skel:  Other mild Right elbow flexion contracture Gen NAD   Assessment/Plan: 1. 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 Physiatrist is providing close team supervision and 24 hour management of active medical problems listed below. Physiatrist and rehab team continue to assess barriers to discharge/monitor patient progress toward functional and medical goals. FIM: Function - Bathing Position: Shower Body  parts bathed by patient: Right arm, Left arm, Chest, Abdomen, Front perineal area, Right upper leg, Left upper leg Body parts bathed by helper: Back, Buttocks Bathing not applicable: Buttocks, Left lower leg, Right lower leg, Right arm Assist Level: Touching or steadying assistance(Pt > 75%)  Function- Upper Body Dressing/Undressing What is the patient wearing?: Bra, Pull over shirt/dress Bra - Perfomed by patient: Thread/unthread right bra strap, Thread/unthread left bra strap, Hook/unhook bra (pull down sports bra) Bra - Perfomed by helper: Hook/unhook bra (pull down sports bra) Pull over shirt/dress - Perfomed by patient: Thread/unthread right sleeve, Thread/unthread left sleeve, Put head through opening, Pull shirt over trunk Pull over shirt/dress - Perfomed by helper: Pull shirt over trunk Assist Level: Touching or steadying assistance(Pt > 75%) Function - Lower Body Dressing/Undressing What is the patient wearing?: Underwear, Pants, Shoes, Socks Position: Wheelchair/chair at [employer] - Performed by patient: Pull underwear up/down Underwear - Performed by helper: Thread/unthread right underwear leg, Thread/unthread left underwear leg Pants- Performed by patient: Thread/unthread right pants leg, Thread/unthread left pants leg, Pull pants up/down, Fasten/unfasten pants Pants- Performed by helper: Thread/unthread right pants leg, Thread/unthread left pants leg, Pull pants up/down, Fasten/unfasten pants Socks - Performed by helper: Don/doff right sock, Don/doff left sock Shoes - Performed by helper: Don/doff right shoe, Don/doff left shoe, Fasten right, Fasten left Assist Level: Touching or steadying assistance (Pt > 75%)  Function - Toileting Toileting steps completed by patient: Adjust clothing prior to toileting, Adjust clothing after toileting Toileting steps completed by helper: Adjust clothing prior to toileting, Adjust clothing after toileting Toileting Assistive Devices: Grab  bar or rail Assist level: Two helpers  Function - Air cabin crew transfer assistive device: Elevated toilet seat/BSC over toilet, Grab bar Mechanical lift: Stedy Assist level to toilet: Maximal assist (Pt 25 - 49%/lift and lower) Assist level from toilet: Maximal assist (Pt 25 - 49%/lift and lower)  Function - Chair/bed transfer Chair/bed transfer method: Stand pivot Chair/bed transfer assist level: Moderate assist (Pt 50 - 74%/lift or lower) (lifting only) Chair/bed transfer assistive device: Armrests Chair/bed transfer details: Manual facilitation for weight shifting  Function - Locomotion: Wheelchair Will patient use wheelchair at discharge?: Yes Type: Manual Max wheelchair distance: 30' with multiple rest breaks (B LEs) Assist Level: Supervision or verbal cues Assist Level: Supervision or verbal cues Wheel 150 feet activity did not occur: Safety/medical concerns (low endurance - unable to complete) Function - Locomotion: Ambulation Assistive device: No device Max distance: 20' Assist level: 2 helpers (and Max  A of 1 and other for W/c follow) Assist level: 2 helpers Walk 50 feet with 2 turns activity did not occur: Safety/medical concerns (low endurance - unable) Walk 150 feet activity did not occur: Safety/medical concerns (low endurance - unable) Walk 10 feet on uneven surfaces activity did not occur: Safety/medical concerns (R LE too unstable - unsafe)  Function - Comprehension Comprehension: Auditory Comprehension assist level: Follows complex conversation/direction with no assist  Function - Expression Expression: Verbal Expression assist level: Expresses basic needs/ideas: With extra time/assistive device  Function - Social Interaction Social Interaction assist level: Interacts appropriately with others with medication or extra time (anti-anxiety, antidepressant).  Function - Problem Solving Problem solving assist level: Solves basic 90% of the  time/requires cueing < 10% of the time  Function - Memory Memory assist level: More than reasonable amount of time Patient normally able to recall (first 3 days only): Current season, Location of own room, Staff names and faces, That he or she is in a hospital  Medical Problem List and Plan: 1. 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 DVT Prophylaxis/Anticoagulation: Pharmaceutical: Lovenox 3. Pain Management: Tylenol prn pain. 4. Mood: LCSW to follow for evaluation and support.  5. Neuropsych: This patient is capable of making decisions on her own behalf. 6. Skin/Wound Care: Routine pressure relief measures.  7. Fluids/Electrolytes/Nutrition: Monitor I/O. Check lytes in am. Offer supplements if intake poor.  8. HTN: Monitor bid. Continue coreg bid. Off HCTZ at this time. BP 154/74 , given extension of CVA, avoid hypotension, check for orthostatic changes, tighten control closer to d/c 9. DM type 2: Monitor BS ac/hs. Continue glipizide and metformin.Freq stools may be metformin side effect Use SSI for elevated BS and adjust medications as indicated. Serum glucose 133 this am 10. H/o Gout: Controlled off medications. Monitor for joint pain and swelling 11.  Psoriasis, local tx, has triamcinolone cream ordered LOS (Days) 5 A FACE TO FACE EVALUATION WAS PERFORMED  KIRSTEINS,ANDREW E 05/11/2015, 8:24 AM

## 2015-05-12 ENCOUNTER — Inpatient Hospital Stay (HOSPITAL_COMMUNITY): Payer: Medicare PPO | Admitting: Speech Pathology

## 2015-05-12 ENCOUNTER — Inpatient Hospital Stay (HOSPITAL_COMMUNITY): Payer: Medicare PPO | Admitting: *Deleted

## 2015-05-12 ENCOUNTER — Inpatient Hospital Stay (HOSPITAL_COMMUNITY): Payer: Medicare PPO | Admitting: Occupational Therapy

## 2015-05-12 ENCOUNTER — Inpatient Hospital Stay (HOSPITAL_COMMUNITY): Payer: Medicare PPO | Admitting: Physical Therapy

## 2015-05-12 ENCOUNTER — Inpatient Hospital Stay (HOSPITAL_COMMUNITY): Payer: Medicare PPO

## 2015-05-12 LAB — GLUCOSE, CAPILLARY
GLUCOSE-CAPILLARY: 130 mg/dL — AB (ref 65–99)
GLUCOSE-CAPILLARY: 87 mg/dL (ref 65–99)
GLUCOSE-CAPILLARY: 76 mg/dL (ref 65–99)

## 2015-05-12 NOTE — Progress Notes (Signed)
Occupational Therapy Session Note  Patient Details  Name: Anne Herrera MRN: [id number] Date of Birth: 1936/08/20  Today's Date: 05/12/2015 OT Individual Time: 815-930  OT Individual Time Calculation (min): 75 min    Short Term Goals: Week 1:  OT Short Term Goal 1 (Week 1): Pt will transfer to/from toilet with Mod A OT Short Term Goal 2 (Week 1): Pt will complete toileting with Min A for thoroughness OT Short Term Goal 3 (Week 1): Pt will bathe 10 of 10 body parts with Min A OT Short Term Goal 4 (Week 1): Pt will dress lower body with Mod A using AE  Skilled Therapeutic Interventions/Progress Updates: ADL-retraining with focus on improved transfers, dynamic standing balance, and adapted dressing skills.   Pt performs stand-pivot transfers from w/c to tub bench with steadying assist and min vc to reposition RLE.   Pt bathes seated using LH sponge and wash mit as instructed and requests assist with washing only her buttocks as she uses grab bars to maintain balance.   Pt recovers to w/c after bathing and dresses at edge of bed.   After attempting lower body dressing at EOB using lateral leans unsuccessfully, pt performs bed mobility with min assist to manage RLE and demos ability to lift her pelvis to pull up her underwear and pants supine unassisted.  Pt recovers to sit at EOB and is educated on use of shoe buttons to improve independence with donning/doffing shoes although pt continues to require assist with this at end of session.   Pt returns to w/c with steadying assist and remains seated with call light within reach.      Therapy Documentation Precautions:  Precautions Precautions: Fall Precaution Comments: Fell at [facility] IR while being assisted to toilet by RN Tech (w/o device) Restrictions Weight Bearing Restrictions: No  Vital Signs: Therapy Vitals Temp: 98.1 F (36.7 C) Temp Source: Oral Pulse Rate: 64 Resp: 18 BP: (!) 151/61 mmHg Patient Position (if appropriate):  Sitting Oxygen Therapy SpO2: 96 % O2 Device: Not Delivered   Pain: No/denies pain   ADL: ADL ADL Comments: see Functional Assessment  See Function Navigator for Current Functional Status.   Therapy/Group: Individual Therapy  Markie Frith 05/12/2015, 3:53 PM

## 2015-05-12 NOTE — Progress Notes (Signed)
Occupational Therapy Session Note  Patient Details  Name: Anne Herrera MRN: [id number] Date of Birth: [birth year]  Today's Date: 05/12/2015 OT Individual Time: 1400-1430 OT Individual Time Calculation (min): 30 min    Short Term Goals: Week 1:  OT Short Term Goal 1 (Week 1): Pt will transfer to/from toilet with Mod A OT Short Term Goal 2 (Week 1): Pt will complete toileting with Min A for thoroughness OT Short Term Goal 3 (Week 1): Pt will bathe 10 of 10 body parts with Min A OT Short Term Goal 4 (Week 1): Pt will dress lower body with Mod A using AE  Skilled Therapeutic Interventions/Progress Updates:    Treatment session with focus on sit <> stand and functional use of RUE.  Performed stand pivot transfer recliner > w/c with mod assist for lifting.  Educated on forward weight shift with sit > stand and transfers as well as improved foot placement to increase ease of transitional movement.  Engaged in RUE NMR in standing with focus on weight shifting, trunk rotation, and grasp as well as shoulder flexion, elbow extension, and horizontal abduction with placing and removing resistive clothespins on horizontal bar.  Pt with improved UE movements this session.  Therapy Documentation Precautions:  Precautions Precautions: Fall Precaution Comments: Fell at [facility] IR while being assisted to toilet by RN Tech (w/o device) Restrictions Weight Bearing Restrictions: No General:   Vital Signs: Therapy Vitals Temp: 98.1 F (36.7 C) Temp Source: Oral Pulse Rate: 64 Resp: 18 BP: (!) 151/61 mmHg Patient Position (if appropriate): Sitting Oxygen Therapy SpO2: 96 % O2 Device: Not Delivered Pain:  Pt with no c/o pain ADL: ADL ADL Comments: see Functional Assessment  See Function Navigator for Current Functional Status.   Therapy/Group: Individual Therapy  Rosalio Loud 05/12/2015, 3:46 PM

## 2015-05-12 NOTE — Progress Notes (Signed)
Occupational Therapy Session Note  Patient Details  Name: Anne Herrera MRN: [id number] Date of Birth: 06-20-1936  Today's Date: 05/12/2015 OT Individual Time: 1030-1100 OT Individual Time Calculation (min): 30 min   Short Term Goals: Week 1:  OT Short Term Goal 1 (Week 1): Pt will transfer to/from toilet with Mod A OT Short Term Goal 2 (Week 1): Pt will complete toileting with Min A for thoroughness OT Short Term Goal 3 (Week 1): Pt will bathe 10 of 10 body parts with Min A OT Short Term Goal 4 (Week 1): Pt will dress lower body with Mod A using AE  Skilled Therapeutic Interventions/Progress Updates:  Patient received seated in w/c. Therapist assisted pt to therapy gym. Set-up patient in front of high/low table and pt performed sit>stand for NMR exercises > RUE. Focused on weight shift > right side, scapular mobilization during arm movements, shoulder flexion/extension, shoulder abduction/adduction, shoulder horizontal adduction. Pt then sat in w/c to focus on elbow flexion/extenstion, and digit flexion/extension. Pt stated she attempts to complete exercises on her own and therapist educated pt on best and safest way to perform these exercises at this time. Therapist assisted pt back to room and left her seated in w/c with all needs within reach. Pt aware to call nurses station/staff for all needs.   Therapy Documentation Precautions:  Precautions Precautions: Fall Precaution Comments: Fell at [facility] IR while being assisted to toilet by RN Tech (w/o device) Restrictions Weight Bearing Restrictions: No  See Function Navigator for Current Functional Status.  Therapy/Group: Individual Therapy  Coryn Mosso , MS, OTR/L, CLT Pager: [phone]  05/12/2015, 11:56 AM

## 2015-05-12 NOTE — Progress Notes (Signed)
Patient ID: Anne Herrera, female   DOB: 31-Mar-1936, 79 y.o.   MRN: [id number] Subjective/Complaints:  Trying not to drink so she can avoid going to [facility] frequently  ROS- freq urination, , no pain c/os, no swallowing c/os, no SOB or CP  Objective: Vital Signs: Blood pressure 142/77, pulse 73, temperature 98.1 F (36.7 C), temperature source Oral, resp. rate 18, height 5' 4"  (1.626 m), weight 84.369 kg (186 lb), SpO2 95 %. No results found. Results for orders placed or performed during the hospital encounter of 05/06/15 (from the past 72 hour(s))  Glucose, capillary     Status: Abnormal   Collection Time: 05/09/15 11:33 AM  Result Value Ref Range   Glucose-Capillary 129 (H) 65 - 99 mg/dL   Comment 1 Notify RN   Glucose, capillary     Status: None   Collection Time: 05/09/15  4:25 PM  Result Value Ref Range   Glucose-Capillary 81 65 - 99 mg/dL   Comment 1 Notify RN   Glucose, capillary     Status: None   Collection Time: 05/09/15  8:52 PM  Result Value Ref Range   Glucose-Capillary 95 65 - 99 mg/dL  Glucose, capillary     Status: Abnormal   Collection Time: 05/10/15  6:04 AM  Result Value Ref Range   Glucose-Capillary 112 (H) 65 - 99 mg/dL   Comment 1 Notify RN   Glucose, capillary     Status: Abnormal   Collection Time: 05/10/15 11:37 AM  Result Value Ref Range   Glucose-Capillary 104 (H) 65 - 99 mg/dL   Comment 1 Notify RN   Glucose, capillary     Status: None   Collection Time: 05/10/15  4:29 PM  Result Value Ref Range   Glucose-Capillary 92 65 - 99 mg/dL   Comment 1 Notify RN   Glucose, capillary     Status: Abnormal   Collection Time: 05/10/15  9:01 PM  Result Value Ref Range   Glucose-Capillary 106 (H) 65 - 99 mg/dL  Basic metabolic panel     Status: Abnormal   Collection Time: 05/11/15  5:59 AM  Result Value Ref Range   Sodium 136 135 - 145 mmol/L   Potassium 3.7 3.5 - 5.1 mmol/L   Chloride 104 101 - 111 mmol/L   CO2 24 22 - 32 mmol/L   Glucose, Bld 133 (H)  65 - 99 mg/dL   BUN 15 6 - 20 mg/dL   Creatinine, Ser 0.85 0.44 - 1.00 mg/dL   Calcium 9.1 8.9 - 10.3 mg/dL   GFR calc non Af Amer >60 >60 mL/min   GFR calc Af Amer >60 >60 mL/min    Comment: (NOTE) The eGFR has been calculated using the CKD EPI equation. This calculation has not been validated in all clinical situations. eGFR's persistently <60 mL/min signify possible Chronic Kidney Disease.    Anion gap 8 5 - 15  Glucose, capillary     Status: Abnormal   Collection Time: 05/11/15  6:21 AM  Result Value Ref Range   Glucose-Capillary 133 (H) 65 - 99 mg/dL  Glucose, capillary     Status: None   Collection Time: 05/11/15 11:32 AM  Result Value Ref Range   Glucose-Capillary 82 65 - 99 mg/dL   Comment 1 Notify RN   Glucose, capillary     Status: None   Collection Time: 05/11/15  5:04 PM  Result Value Ref Range   Glucose-Capillary 69 65 - 99 mg/dL   Comment 1 Notify  RN   Glucose, capillary     Status: None   Collection Time: 05/11/15  5:39 PM  Result Value Ref Range   Glucose-Capillary 93 65 - 99 mg/dL   Comment 1 Notify RN   Glucose, capillary     Status: Abnormal   Collection Time: 05/11/15  9:04 PM  Result Value Ref Range   Glucose-Capillary 128 (H) 65 - 99 mg/dL   Comment 1 Notify RN   Glucose, capillary     Status: Abnormal   Collection Time: 05/12/15  6:52 AM  Result Value Ref Range   Glucose-Capillary 130 (H) 65 - 99 mg/dL   Comment 1 Notify RN      HEENT: normal and no strabismus Cardio: RRR and no murmur Resp: CTA B/L GI: BS positive and NT, ND Extremity:  No Edema Skin:   Other psoriatic patch bilateral pre patellar Neuro: Alert/Oriented, Cranial Nerve Abnormalities right central 7, Abnormal Sensory  R side, Abnormal Motor 2- R delt , bi, tri, grip, HF, KE, ADF, Abnormal FMC Ataxic/ dec FMC and Dysarthric Musc/Skel:  Other mild Right elbow flexion contracture Gen NAD   Assessment/Plan: 1. 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 Physiatrist is providing close team supervision and 24 hour management of active medical problems listed below. Physiatrist and rehab team continue to assess barriers to discharge/monitor patient progress toward functional and medical goals. FIM: Function - Bathing Position: Shower Body parts bathed by patient: Right arm, Left arm, Chest, Abdomen, Front perineal area, Right upper leg, Left upper leg Body parts bathed by helper: Back, Buttocks Bathing not applicable: Buttocks, Left lower leg, Right lower leg, Right arm Assist Level: Touching or steadying assistance(Pt > 75%)  Function- Upper Body Dressing/Undressing What is the patient wearing?: Bra, Pull over shirt/dress Bra - Perfomed by patient: Thread/unthread right bra strap, Thread/unthread left bra strap, Hook/unhook bra (pull down sports bra) Bra - Perfomed by helper: Hook/unhook bra (pull down sports bra) Pull over shirt/dress - Perfomed by patient: Thread/unthread right sleeve, Thread/unthread left sleeve, Put head through opening, Pull shirt over trunk Pull over shirt/dress - Perfomed by helper: Pull shirt over trunk Assist Level: Touching or steadying assistance(Pt > 75%) Function - Lower Body Dressing/Undressing What is the patient wearing?: Underwear, Pants, Shoes, Socks Position: Wheelchair/chair at [employer] - Performed by patient: Pull underwear up/down Underwear - Performed by helper: Thread/unthread right underwear leg, Thread/unthread left underwear leg Pants- Performed by patient: Thread/unthread right pants leg, Thread/unthread left pants leg, Pull pants up/down, Fasten/unfasten pants Pants- Performed by helper: Thread/unthread right pants leg, Thread/unthread left pants leg, Pull pants up/down, Fasten/unfasten pants Socks - Performed by helper: Don/doff right sock, Don/doff left  sock Shoes - Performed by helper: Don/doff right shoe, Don/doff left shoe, Fasten right, Fasten left Assist Level: Touching or steadying assistance (Pt > 75%)  Function - Toileting Toileting steps completed by patient: Adjust clothing prior to toileting, Performs perineal hygiene, Adjust clothing after toileting Toileting steps completed by helper: Adjust clothing prior to toileting, Adjust clothing after toileting Toileting Assistive Devices: Grab bar or rail Assist level: Two helpers  Function - Air cabin crew transfer assistive device: Elevated toilet seat/BSC over toilet, Grab bar Mechanical lift: Stedy Assist level to toilet: Touching or steadying assistance (Pt > 75%) Assist level from toilet: Touching or steadying assistance (Pt > 75%)  Function - Chair/bed transfer Chair/bed transfer method: Stand  pivot Chair/bed transfer assist level: Moderate assist (Pt 50 - 74%/lift or lower) Chair/bed transfer assistive device: Armrests Chair/bed transfer details: Manual facilitation for weight shifting, Verbal cues for technique, Verbal cues for sequencing  Function - Locomotion: Wheelchair Will patient use wheelchair at discharge?: No Type: Manual Max wheelchair distance: 150 Assist Level: Supervision or verbal cues Assist Level: Supervision or verbal cues Wheel 150 feet activity did not occur: Safety/medical concerns (low endurance - unable to complete) Assist Level: Supervision or verbal cues Turns around,maneuvers to table,bed, and toilet,negotiates 3% grade,maneuvers on rugs and over doorsills: No Function - Locomotion: Ambulation Assistive device: Walker-rolling Max distance: 30 Assist level: Moderate assist (Pt 50 - 74%) Assist level: Moderate assist (Pt 50 - 74%) Walk 50 feet with 2 turns activity did not occur: Safety/medical concerns (low endurance - unable) Walk 150 feet activity did not occur: Safety/medical concerns (low endurance - unable) Walk 10 feet on  uneven surfaces activity did not occur: Safety/medical concerns (R LE too unstable - unsafe)  Function - Comprehension Comprehension: Auditory Comprehension assist level: Follows complex conversation/direction with no assist  Function - Expression Expression: Verbal Expression assist level: Expresses basic needs/ideas: With extra time/assistive device  Function - Social Interaction Social Interaction assist level: Interacts appropriately with others with medication or extra time (anti-anxiety, antidepressant).  Function - Problem Solving Problem solving assist level: Solves basic 90% of the time/requires cueing < 10% of the time  Function - Memory Memory assist level: More than reasonable amount of time Patient normally able to recall (first 3 days only): Current season, Location of own room, Staff names and faces, That he or she is in a hospital  Medical Problem List and Plan: 1. 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 DVT Prophylaxis/Anticoagulation: Pharmaceutical: Lovenox 3. Pain Management: Tylenol prn pain. 4. Mood: LCSW to follow for evaluation and support.  5. Neuropsych: This patient is capable of making decisions on her own behalf. 6. Skin/Wound Care: Routine pressure relief measures.  7. Fluids/Electrolytes/Nutrition: Monitor I/O. Marland Kitchen50-100% meals, ~620m fluids, enc fluid intake 8. HTN: Monitor bid. Continue coreg bid. Off HCTZ at this time. BP 142/77 , given extension of CVA, avoid hypotension, check for orthostatic changes, tighten control closer to d/c 9. DM type 2: Monitor BS ac/hs. Control looks good Continue glipizide and metformin.Freq stools may be metformin side effect Use SSI for elevated BS and adjust medications as indicated. Serum glucose 130 this am 10. H/o Gout: Controlled off  medications. Monitor for joint pain and swelling 11.  Psoriasis, local tx, has triamcinolone cream ordered LOS (Days) 6 A FACE TO FACE EVALUATION WAS PERFORMED  Tamiko Leopard E 05/12/2015, 7:54 AM

## 2015-05-12 NOTE — Progress Notes (Addendum)
Physical Therapy Session Note  Patient Details  Name: Anne Herrera MRN: [id number] Date of Birth: February 28, 1936  Today's Date: 05/12/2015 PT Individual Time: 4098-1191 PT Individual Time Calculation (min): 60 min   Short Term Goals: Week 1:  PT Short Term Goal 1 (Week 1): Pt will roll L in bed req min A.  PT Short Term Goal 2 (Week 1): Pt will demonstrate side lie to sit transfer req min A.  PT Short Term Goal 3 (Week 1): Pt will demonstrate sit to stand req mod A, consistently.  PT Short Term Goal 4 (Week 1): Pt will ambulate with one person assist using HW or RW.  PT Short Term Goal 5 (Week 1): Pt will ascend/descend 2 steps with B rails req mod A.   Skilled Therapeutic Interventions/Progress Updates:   Session focused on RLE NMR, weightbearing through RLE, safety awareness, and activity tolerance with functional mobility. Patient performed stand pivot transfer recliner > wheelchair with mod A and max verbal cues for sequencing, weight shift, and technique. Patient propelled wheelchair using BLE and intermittent use of RUE x 150 ft with multiple rest breaks due to fatigue and supervision cues for sequencing and obstacle negotiation. Gait using RW with R hand orthosis x 25 ft with mod A progressed to gait using RW and R [place] AFO donned (only R AFO available in small size) x 25 ft + 50 ft and min A with improved RLE clearance and heel strike, resulting in improved safety. Patient required max multimodal cues for equal BLE weightbearing, sequencing, and safety with turns and strapping/unstrapping RUE using hand splint. Stair training up/down eight 6-inch steps using 2 rails with min verbal/demonstration cues for step-to pattern and max verbal cues to attend to RUE/advance along rail, min A overall. Rapid stepping LLE to 6" step to facilitate increased RLE weightbearing/NMR, LUE support on rail progressed to fingertip support, min-mod A overall and tactile cues for RLE muscle activation to  decrease knee hyperextension. Progressed to RLE stepping to 6" step with LUE support with focus on weight shifting and R hip flexor activation, min-mod A. Patient required frequent standing rest breaks due to fatigue. Patient left sitting in recliner with all needs within reach and visitors present.   Therapy Documentation Precautions:  Precautions Precautions: Fall Precaution Comments: Fell at [facility] while being assisted to toilet by RN Tech (w/o device) Restrictions Weight Bearing Restrictions: No Pain: Pain Assessment Pain Assessment: No/denies pain   See Function Navigator for Current Functional Status.   Therapy/Group: Individual Therapy  Kerney Elbe 05/12/2015, 4:36 PM

## 2015-05-13 ENCOUNTER — Inpatient Hospital Stay (HOSPITAL_COMMUNITY): Payer: Medicare PPO

## 2015-05-13 ENCOUNTER — Inpatient Hospital Stay (HOSPITAL_COMMUNITY): Payer: Medicare PPO | Admitting: Physical Therapy

## 2015-05-13 ENCOUNTER — Inpatient Hospital Stay (HOSPITAL_COMMUNITY): Payer: Medicare PPO | Admitting: Occupational Therapy

## 2015-05-13 LAB — GLUCOSE, CAPILLARY
GLUCOSE-CAPILLARY: 88 mg/dL (ref 65–99)
GLUCOSE-CAPILLARY: 122 mg/dL — AB (ref 65–99)
Glucose-Capillary: 77 mg/dL (ref 65–99)
Glucose-Capillary: 78 mg/dL (ref 65–99)
Glucose-Capillary: 84 mg/dL (ref 65–99)

## 2015-05-13 MED ORDER — CHLORTHALIDONE 25 MG PO TABS
25.0000 mg | ORAL_TABLET | Freq: Every day | ORAL | Status: DC
  Administered 2015-05-13 – 2015-05-22 (×10): 25 mg via ORAL
  Filled 2015-05-13 (×12): qty 1

## 2015-05-13 NOTE — Progress Notes (Signed)
Patient placed on CPAP for the night. Patient tolerating well at this time.  

## 2015-05-13 NOTE — Progress Notes (Signed)
Orthopedic Tech Progress Note Patient Details:  Anne Herrera June 29, 1936 [id number]  Patient ID: Anne Herrera, female   DOB: [birth year], 79 y.o.   MRN: [id number] Called in advanced brace order; spoke with Ferdinand Lango, Erminio Nygard 05/13/2015, 12:43 PM

## 2015-05-13 NOTE — Patient Care Conference (Signed)
Inpatient RehabilitationTeam Conference and Plan of Care Update Date: 05/13/2015   Time: 11;40 AM    Patient Name: Genowefa Morga      Medical Record Number: [id number]  Date of Birth: Jul 28, 1936 Sex: Female         Room/Bed: 4M09C/4M09C-01 Payor Info: Payor: [employer] MEDICARE / Plan: [employer] MEDICARE CHOICE PPO / Product Type: *No Product type* /    Admitting Diagnosis: L CVA  Admit Date/Time:  05/06/2015  2:37 PM Admission Comments: No comment available   Primary Diagnosis:  <principal problem not specified> Principal Problem: <principal problem not specified>  Patient Active Problem List   Diagnosis Date Noted  . Spastic hemiplegia affecting right dominant side 05/07/2015  . 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 Lacunar stroke of left subthalamic region 05/06/2015  . TIA (transient ischemic attack) 04/28/2015    Expected Discharge Date: Expected Discharge Date: 05/21/15  Team Members Present: Physician leading conference: Dr. Claudette Laws Social Worker Present: Dossie Der, LCSW Nurse Present: Carmie End, RN PT Present: Laurin Coder, PT OT Present: Roney Mans, OT SLP Present: Jackalyn Lombard, SLP PPS Coordinator present : Edson Snowball, PT     Current Status/Progress Goal Weekly Team Focus  Medical   right hemiparesis constinues, no cardiac symptoms  Home with family assist  Fluid intake   Bowel/Bladder   pt continent of bowel and bladder.   Mod I  cont. POC with b/b   Swallow/Nutrition/ Hydration     na        ADL's   overall min A for transfers, mod A for LB dressing, toileting with mod A   supervision toileting, toilet transfer, dressing and bathing with supervision  right UE NMR, transfers, sit to stands, standing balance.    Mobility   min-mod A  Supervision - min A  AFO prescription, R side NMR, activity tolerance, standing balance, all functional mobility   Communication     na        Safety/Cognition/ Behavioral Observations    no unsafe  behaviors-calls for assistance        Pain   no complaints of pain         Skin   dry patches to BL knees- kenalog cream applied as ordered  free of skin breakdown  assess skin q shift      *See Care Plan and progress notes for long and short-term goals.  Barriers to Discharge: still monitoring I/Os    Possible Resolutions to Barriers:  Cont rehab, encourage po, f/u BMET    Discharge Planning/Teaching Needs:  Family not able to provide 24 hr care, may begin pursuing alternatives for discharge      Team Discussion:  Goals-supervision/min level of assist. BP over goal rate, adjusting medicines. R-arm improving. Encourage po intake. Carry over poor from day to day which affect safety awareness. Pt feels needs to go to another facility from not ready to return home  Revisions to Treatment Plan:  Wants to go to facility from rehab   Continued Need for Acute Rehabilitation Level of Care: The patient requires daily medical management by a physician with specialized training in physical medicine and rehabilitation for the following conditions: Daily direction of a multidisciplinary physical rehabilitation program to ensure safe treatment while eliciting the highest outcome that is of practical value to the patient.: Yes Daily medical management of patient stability for increased activity during participation in an intensive rehabilitation regime.: Yes Daily analysis of laboratory values and/or radiology reports with any subsequent need  for medication adjustment of medical intervention for : Neurological problems;Other  Erionna Strum, Lemar Livings 05/13/2015, 2:23 PM

## 2015-05-13 NOTE — Progress Notes (Signed)
Physical Therapy Session Note  Patient Details  Name: Anne Herrera MRN: [id number] Date of Birth: 1935/09/21  Today's Date: 05/13/2015 PT Individual Time: 1430-1530 PT Individual Time Calculation (min): 60 min   Short Term Goals: Week 1:  PT Short Term Goal 1 (Week 1): Pt will roll L in bed req min A.  PT Short Term Goal 1 - Progress (Week 1): Met PT Short Term Goal 2 (Week 1): Pt will demonstrate side lie to sit transfer req min A.  PT Short Term Goal 2 - Progress (Week 1): Met PT Short Term Goal 3 (Week 1): Pt will demonstrate sit to stand req mod A, consistently.  PT Short Term Goal 3 - Progress (Week 1): Met PT Short Term Goal 4 (Week 1): Pt will ambulate with one person assist using HW or RW.  PT Short Term Goal 4 - Progress (Week 1): Met PT Short Term Goal 5 (Week 1): Pt will ascend/descend 2 steps with B rails req mod A.  PT Short Term Goal 5 - Progress (Week 1): Met  Skilled Therapeutic Interventions/Progress Updates:  neuromuscular re-education via demo, VCs, manual cues for activities below  W/c><mat squat pivot with R AFO, focusing on anterior wt shift and head/hips relationship; w/c > recliner stand pivot to R with AFO, focusing on R sidestepping/pivot.  Pt DOE 3/4 for standing activities, requiring standing rest breaks frequently.  Cues for diaphragmatic breathing.    PT pushed pt back to room in w/c, and lleft her sitting in recliner with all needs within reach.  Visitors in room with pt. Therapy Documentation Precautions:  Precautions Precautions: Fall Precaution Comments: Fell at [facility] IR while being assisted to toilet by RN Tech (w/o device) Restrictions Weight Bearing Restrictions: No Pain: Pain Assessment Pain Assessment: No/denies pain    Treatments Therapeutic Activity: sitting: bil UE wt bearing forward working on head/hips relationship for sit> stand; pelvic dissociation in sitting, reaching out of BOs to R and L to facilitate trunk righting; bil  hip add for core activation; reaching out of BOS. Standing: R sidestepping with RW without AFO and kicking cones with R foot to facilitate R hamstrings and R ankle DF Neuromuscular Facilitation: Right;Lower Extremity;Forced use;Upper Extremity;Activity to increase sustained activation;Activity to increase lateral weight shifting;Activity to increase anterior-posterior weight shifting;Activity to increase timing and sequencing;Activity to increase grading;Limitations Weight Bearing Technique Weight Bearing Technique: Yes RUE Weight Bearing Technique: Forearm seated;Extended arm seated See Function Navigator for Current Functional Status.   Therapy/Group: Individual Therapy  Emilian Stawicki 05/13/2015, 3:46 PM

## 2015-05-13 NOTE — Progress Notes (Signed)
Social Work Patient ID: Anne Herrera, female   DOB: [birth year], 79 y.o.   MRN: [id number] Met with pt to discuss team conference goals-supervision/min level and her wanting to go to another facility upon discharge from rehab. She had visitors so will  Come back in the morning to discuss her questions and the plan. Will also contact son to make aware of the team conference goals.

## 2015-05-13 NOTE — Progress Notes (Signed)
Physical Therapy Weekly Progress Note  Patient Details  Name: Anne Herrera MRN: [id number] Date of Birth: 10/13/1935  Beginning of progress report period: May 06, 2015 End of progress report period: May 13, 2015  Today's Date: 05/13/2015 PT Individual Time: 0930-1030 PT Individual Time Calculation (min): 60 min   Patient has met 5 of 5 short term goals.  Pt has progressed to more of a min-mod A functional level due to progressing improvement in motor return on R side of body, but continues to have low activity tolerance and req up to max cueing for sequencing and safety awareness.   Patient continues to demonstrate the following deficits: R hemiplegia, R side motor apraxia, baseline mild cognitive deficits that impair sequencing and safety awareness, low activity tolerance, impaired standing balance, difficulty with all functional mobility and therefore will continue to benefit from skilled PT intervention to enhance overall performance with activity tolerance, balance, postural control, ability to compensate for deficits, functional use of  right upper extremity and right lower extremity and coordination.  Patient progressing toward long term goals..  Continue plan of care.  PT Short Term Goals Week 1:  PT Short Term Goal 1 (Week 1): Pt will roll L in bed req min A.  PT Short Term Goal 1 - Progress (Week 1): Met PT Short Term Goal 2 (Week 1): Pt will demonstrate side lie to sit transfer req min A.  PT Short Term Goal 2 - Progress (Week 1): Met PT Short Term Goal 3 (Week 1): Pt will demonstrate sit to stand req mod A, consistently.  PT Short Term Goal 3 - Progress (Week 1): Met PT Short Term Goal 4 (Week 1): Pt will ambulate with one person assist using HW or RW.  PT Short Term Goal 4 - Progress (Week 1): Met PT Short Term Goal 5 (Week 1): Pt will ascend/descend 2 steps with B rails req mod A.  PT Short Term Goal 5 - Progress (Week 1): Met Week 2:  PT Short Term Goal 1  (Week 2): Pt will demonstrate all bed mobility req SBA.  PT Short Term Goal 2 (Week 2): Pt will ambulate >= 130' req min A.  PT Short Term Goal 3 (Week 2): Pt will participate in an AFO evaluation with orthotist.  PT Short Term Goal 4 (Week 2): Pt will demosntrate dynamic stand balance req min A.  PT Short Term Goal 5 (Week 2): Pt will tolerate 5 minutes of standing during an activity for improved activity tolerance.   Skilled Therapeutic Interventions/Progress Updates:    Pt received up in w/c with AFO in place, agreeable to PT session. Therapeutic Activity - bed mobility and transfers reassessed - see function tab for details. Pt req cues to prevent learned nonuse of R arm/leg with rolling L in bed, as well as up to max A for sequencing. Gait Training - see function tab for details. Pt completes one bout with AFO, RW, and R HO req mostly min A but occasional mod A with LOB, usually due to decreased R foot clearance due to insufficient lateral weight shift L during R swing phase. PT then instructs pt in a second bout of ambulation x 130' without AFO req same level of assist as above, but with more frequent episodes of poor R foot clearance, as well as R ankle inversion moment during swing and absent R heel strike. Pt demonstrates good knee control until fatigued at the end of this walk when knee begins hyperextending in  stance. Pt will require an AFO at d/c for safety with mobility, but has potential to continuing recovering R ankle/knee motor control - continue practicing functional mobility with and without AFO during IPR stay. Stair training - see function tab for details - pt completes alternating step-to pattern on 3" side of corner staircase and up with strong/down with weak on 6" side of staircase. W/C Management - PT instructs pt in forced use of R UE during w/c propulsion - see function tab for details. Continue per PT POC.   Therapy Documentation Precautions:  Precautions Precautions:  Fall Precaution Comments: Fell at [facility] IR while being assisted to toilet by RN Tech (w/o device) Restrictions Weight Bearing Restrictions: No Pain: Pain Assessment Pain Assessment: No/denies pain   See Function Navigator for Current Functional Status.  Therapy/Group: Individual Therapy  Almer Bushey M 05/13/2015, 10:24 AM

## 2015-05-13 NOTE — Progress Notes (Signed)
Occupational Therapy Session Note  Patient Details  Name: Anne Herrera MRN: [id number] Date of Birth: [birth year]  Today's Date: 05/13/2015 OT Individual Time: 1403-1430 OT Individual Time Calculation (min): 27 min    Short Term Goals: Week 1:  OT Short Term Goal 1 (Week 1): Pt will transfer to/from toilet with Mod A OT Short Term Goal 2 (Week 1): Pt will complete toileting with Min A for thoroughness OT Short Term Goal 3 (Week 1): Pt will bathe 10 of 10 body parts with Min A OT Short Term Goal 4 (Week 1): Pt will dress lower body with Mod A using AE  Skilled Therapeutic Interventions/Progress Updates:    1:1 self care retraining at shower level with focus on transfer training (stand step pivot) to w/c, BSC over commode, tub bench and EOB with min A and extra time and VC for sequence and safety. Continued focus on NMR of right hand with all functional tasks. Pt able to grasp and release items with more than reasonable amt of time but required min A during functional reach to promote shoulder and elbow extension. Pt able to thread bilateral LEs in underwear without reacher with steadying A but utilized reacher for threading pants. VC provided for use of right hand with clothing management and more than reasonable amt of time. Pt dressed EOB and continue to focus on weight bearing through right LE and maintain standing posture at midline. Pt required min A with extra time for sit to stands. Performed all grooming tasks at sink with extra time to incorporate right hand (at mod I level).   Therapy Documentation Precautions:  Precautions Precautions: Fall Precaution Comments: Fell at [facility] IR while being assisted to toilet by RN Tech (w/o device) Restrictions Weight Bearing Restrictions: No Pain: Pain Assessment Pain Assessment: No/denies pain ADL: ADL ADL Comments: see Functional Assessment  See Function Navigator for Current Functional Status.   Therapy/Group: Individual  Therapy  Roney Mans [facility] 05/13/2015, 3:21 PM

## 2015-05-13 NOTE — Progress Notes (Signed)
Patient ID: Anne Herrera, female   DOB: 1935-11-05, 79 y.o.   MRN: [id number] Subjective/Complaints:  Up to BR x 3 yesterday pm, trying to drink more  ROS- freq urination, , no pain c/os, no swallowing c/os, no SOB or CP  Objective: Vital Signs: Blood pressure 168/72, pulse 72, temperature 98.6 F (37 C), temperature source Oral, resp. rate 18, height 5' 4"  (1.626 m), weight 84.369 kg (186 lb), SpO2 98 %. No results found. Results for orders placed or performed during the hospital encounter of 05/06/15 (from the past 72 hour(s))  Glucose, capillary     Status: Abnormal   Collection Time: 05/10/15 11:37 AM  Result Value Ref Range   Glucose-Capillary 104 (H) 65 - 99 mg/dL   Comment 1 Notify RN   Glucose, capillary     Status: None   Collection Time: 05/10/15  4:29 PM  Result Value Ref Range   Glucose-Capillary 92 65 - 99 mg/dL   Comment 1 Notify RN   Glucose, capillary     Status: Abnormal   Collection Time: 05/10/15  9:01 PM  Result Value Ref Range   Glucose-Capillary 106 (H) 65 - 99 mg/dL  Basic metabolic panel     Status: Abnormal   Collection Time: 05/11/15  5:59 AM  Result Value Ref Range   Sodium 136 135 - 145 mmol/L   Potassium 3.7 3.5 - 5.1 mmol/L   Chloride 104 101 - 111 mmol/L   CO2 24 22 - 32 mmol/L   Glucose, Bld 133 (H) 65 - 99 mg/dL   BUN 15 6 - 20 mg/dL   Creatinine, Ser 0.85 0.44 - 1.00 mg/dL   Calcium 9.1 8.9 - 10.3 mg/dL   GFR calc non Af Amer >60 >60 mL/min   GFR calc Af Amer >60 >60 mL/min    Comment: (NOTE) The eGFR has been calculated using the CKD EPI equation. This calculation has not been validated in all clinical situations. eGFR's persistently <60 mL/min signify possible Chronic Kidney Disease.    Anion gap 8 5 - 15  Glucose, capillary     Status: Abnormal   Collection Time: 05/11/15  6:21 AM  Result Value Ref Range   Glucose-Capillary 133 (H) 65 - 99 mg/dL  Glucose, capillary     Status: None   Collection Time: 05/11/15 11:32 AM   Result Value Ref Range   Glucose-Capillary 82 65 - 99 mg/dL   Comment 1 Notify RN   Glucose, capillary     Status: None   Collection Time: 05/11/15  5:04 PM  Result Value Ref Range   Glucose-Capillary 69 65 - 99 mg/dL   Comment 1 Notify RN   Glucose, capillary     Status: None   Collection Time: 05/11/15  5:39 PM  Result Value Ref Range   Glucose-Capillary 93 65 - 99 mg/dL   Comment 1 Notify RN   Glucose, capillary     Status: Abnormal   Collection Time: 05/11/15  9:04 PM  Result Value Ref Range   Glucose-Capillary 128 (H) 65 - 99 mg/dL   Comment 1 Notify RN   Glucose, capillary     Status: Abnormal   Collection Time: 05/12/15  6:52 AM  Result Value Ref Range   Glucose-Capillary 130 (H) 65 - 99 mg/dL   Comment 1 Notify RN   Glucose, capillary     Status: None   Collection Time: 05/12/15 11:20 AM  Result Value Ref Range   Glucose-Capillary 87 65 - 99  mg/dL   Comment 1 Notify RN   Glucose, capillary     Status: None   Collection Time: 05/12/15  5:09 PM  Result Value Ref Range   Glucose-Capillary 76 65 - 99 mg/dL   Comment 1 Notify RN   Glucose, capillary     Status: None   Collection Time: 05/12/15  8:50 PM  Result Value Ref Range   Glucose-Capillary 88 65 - 99 mg/dL  Glucose, capillary     Status: Abnormal   Collection Time: 05/13/15  6:26 AM  Result Value Ref Range   Glucose-Capillary 122 (H) 65 - 99 mg/dL     HEENT: normal and no strabismus Cardio: RRR and no murmur Resp: CTA B/L GI: BS positive and NT, ND Extremity:  No Edema Skin:   Other psoriatic patch bilateral pre patellar Neuro: Alert/Oriented, Cranial Nerve Abnormalities right central 7, Abnormal Sensory  R side, Abnormal Motor 2- R delt , bi, tri, grip, HF, KE, ADF, Abnormal FMC Ataxic/ dec FMC and Dysarthric Musc/Skel:  Other mild Right elbow flexion contracture Gen NAD   Assessment/Plan: 1. 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 Physiatrist is providing close team supervision and 24 hour management of active medical problems listed below. Physiatrist and rehab team continue to assess barriers to discharge/monitor patient progress toward functional and medical goals. Team conference today please see physician documentation under team conference tab, met with team face-to-face to discuss problems,progress, and goals. Formulized individual treatment plan based on medical history, underlying problem and comorbidities. FIM: Function - Bathing Position: Shower Body parts bathed by patient: Right arm, Left arm, Chest, Abdomen, Front perineal area, Right upper leg, Left upper leg, Right lower leg, Left lower leg, Back Body parts bathed by helper: Buttocks Bathing not applicable: Buttocks, Left lower leg, Right lower leg, Right arm Assist Level: Touching or steadying assistance(Pt > 75%)  Function- Upper Body Dressing/Undressing What is the patient wearing?: Bra, Pull over shirt/dress Bra - Perfomed by patient: Thread/unthread right bra strap, Thread/unthread left bra strap Bra - Perfomed by helper: Hook/unhook bra (pull down sports bra) Pull over shirt/dress - Perfomed by patient: Thread/unthread right sleeve, Thread/unthread left sleeve, Put head through opening, Pull shirt over trunk Pull over shirt/dress - Perfomed by helper: Pull shirt over trunk Assist Level: Touching or steadying assistance(Pt > 75%) Function - Lower Body Dressing/Undressing What is the patient wearing?: Underwear, Pants, Socks, Shoes Position: Bed Underwear - Performed by patient: Thread/unthread right underwear leg, Thread/unthread left underwear leg, Pull underwear up/down Underwear - Performed by helper: Thread/unthread right underwear leg, Thread/unthread left underwear leg Pants- Performed by patient: Thread/unthread right pants leg, Thread/unthread left pants  leg, Pull pants up/down, Fasten/unfasten pants Pants- Performed by helper: Thread/unthread right pants leg, Thread/unthread left pants leg, Pull pants up/down, Fasten/unfasten pants Socks - Performed by helper: Don/doff right sock, Don/doff left sock Shoes - Performed by helper: Don/doff right shoe, Don/doff left shoe, Fasten right, Fasten left Assist Level: Touching or steadying assistance (Pt > 75%)  Function - Toileting Toileting steps completed by patient: Adjust clothing prior to toileting, Performs perineal hygiene, Adjust clothing after toileting Toileting steps completed by helper: Adjust clothing prior to toileting, Adjust clothing after toileting Toileting Assistive Devices: Grab bar or rail Assist level: Touching or steadying assistance (Pt.75%)  Function - Toilet Transfers Toilet transfer assistive device: Elevated toilet seat/BSC over toilet, Grab bar Mechanical lift:  Stedy Assist level to toilet: Touching or steadying assistance (Pt > 75%) Assist level from toilet: Touching or steadying assistance (Pt > 75%)  Function - Chair/bed transfer Chair/bed transfer method: Stand pivot Chair/bed transfer assist level: Moderate assist (Pt 50 - 74%/lift or lower) Chair/bed transfer assistive device: Armrests Chair/bed transfer details: Manual facilitation for weight shifting, Verbal cues for technique, Verbal cues for sequencing, Verbal cues for precautions/safety  Function - Locomotion: Wheelchair Will patient use wheelchair at discharge?: Yes Type: Manual Max wheelchair distance: 150 Assist Level: Supervision or verbal cues Assist Level: Supervision or verbal cues Wheel 150 feet activity did not occur: Safety/medical concerns (low endurance - unable to complete) Assist Level: Supervision or verbal cues Turns around,maneuvers to table,bed, and toilet,negotiates 3% grade,maneuvers on rugs and over doorsills: No Function - Locomotion: Ambulation Assistive device: Walker-rolling,  Orthosis (R AFO) Max distance: 50 ft Assist level: Moderate assist (Pt 50 - 74%) Assist level: Moderate assist (Pt 50 - 74%) Walk 50 feet with 2 turns activity did not occur: N/A Assist level: Moderate assist (Pt 50 - 74%) Walk 150 feet activity did not occur: Safety/medical concerns (low endurance - unable) Walk 10 feet on uneven surfaces activity did not occur: Safety/medical concerns (R LE too unstable - unsafe)  Function - Comprehension Comprehension: Auditory Comprehension assist level: Follows complex conversation/direction with extra time/assistive device  Function - Expression Expression: Verbal Expression assist level: Expresses complex 90% of the time/cues < 10% of the time  Function - Social Interaction Social Interaction assist level: Interacts appropriately with others with medication or extra time (anti-anxiety, antidepressant).  Function - Problem Solving Problem solving assist level: Solves basic problems with no assist  Function - Memory Memory assist level: More than reasonable amount of time Patient normally able to recall (first 3 days only): Current season, Location of own room, Staff names and faces, That he or she is in a hospital  Medical Problem List and Plan: 1. 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 DVT Prophylaxis/Anticoagulation: Pharmaceutical: Lovenox 3. Pain Management: Tylenol prn pain. 4. Mood: LCSW to follow for evaluation and support.  5. Neuropsych: This patient is capable of making decisions on her own behalf. 6. Skin/Wound Care: Routine pressure relief measures.  7. Fluids/Electrolytes/Nutrition: Monitor I/O. Marland Kitchen50-100% meals, ~621m fluids, enc fluid intake 8. HTN: Monitor bid. Continue coreg bid. Off HCTZ at this time. BP 168/72 , given extension of CVA, avoid  hypotension, cNo orthostatic changes,may resume low dose 9. DM type 2: Monitor BS ac/hs. Control looks good Continue glipizide and metformin.Freq stools may be metformin side effect Use SSI for elevated BS and adjust medications as indicated.  glucose 122 this am 10. H/o Gout: Controlled off medications. Monitor for joint pain and swelling 11.  Psoriasis, local tx, has triamcinolone cream ordered LOS (Days) 7 A FACE TO FACE EVALUATION WAS PERFORMED  Maleeha Halls E 05/13/2015, 7:56 AM

## 2015-05-13 NOTE — Progress Notes (Signed)
Occupational Therapy Session Note  Patient Details  Name: Anne Herrera MRN: [id number] Date of Birth: Jan 20, 1936  Today's Date: 05/13/2015 OT Individual Time: 1403-1430 OT Individual Time Calculation (min): 27 min    Short Term Goals: Week 1:  OT Short Term Goal 1 (Week 1): Pt will transfer to/from toilet with Mod A OT Short Term Goal 2 (Week 1): Pt will complete toileting with Min A for thoroughness OT Short Term Goal 3 (Week 1): Pt will bathe 10 of 10 body parts with Min A OT Short Term Goal 4 (Week 1): Pt will dress lower body with Mod A using AE  Skilled Therapeutic Interventions/Progress Updates:    Treatment session with focus on RUE NMR and education on exercises and functional movements she can complete in room between sessions.  Stand pivot transfer this session with min assist, tactile cues for forward weight shift and proper positioning of RLE.  Educated on exercises to complete with BUE to promote overflow and improved symmetry with strengthening and ROM of RUE.  Utilized therapy ball to promote symmetrical movements with focus on shoulder flexion/extension, elbow extension, and shoulder internal/external rotation.  Use of UE Ranger with further focus on RUE shoulder and elbow movements.  Therapy Documentation Precautions:  Precautions Precautions: Fall Precaution Comments: Fell at [facility] IR while being assisted to toilet by RN Tech (w/o device) Restrictions Weight Bearing Restrictions: No Pain: Pain Assessment Pain Assessment: No/denies pain ADL: ADL ADL Comments: see Functional Assessment  See Function Navigator for Current Functional Status.   Therapy/Group: Individual Therapy  Rosalio Loud 05/13/2015, 3:19 PM

## 2015-05-14 ENCOUNTER — Inpatient Hospital Stay (HOSPITAL_COMMUNITY): Payer: Medicare PPO | Admitting: Occupational Therapy

## 2015-05-14 ENCOUNTER — Inpatient Hospital Stay (HOSPITAL_COMMUNITY): Payer: Medicare PPO | Admitting: Physical Therapy

## 2015-05-14 ENCOUNTER — Inpatient Hospital Stay (HOSPITAL_COMMUNITY): Payer: Medicare PPO

## 2015-05-14 DIAGNOSIS — M21371 Foot drop, right foot: Secondary | ICD-10-CM

## 2015-05-14 LAB — GLUCOSE, CAPILLARY
GLUCOSE-CAPILLARY: 73 mg/dL (ref 65–99)
GLUCOSE-CAPILLARY: 86 mg/dL (ref 65–99)
Glucose-Capillary: 117 mg/dL — ABNORMAL HIGH (ref 65–99)
Glucose-Capillary: 183 mg/dL — ABNORMAL HIGH (ref 65–99)
Glucose-Capillary: 63 mg/dL — ABNORMAL LOW (ref 65–99)

## 2015-05-14 MED ORDER — LACTASE 9000 UNITS PO CHEW
9000.0000 [IU] | CHEWABLE_TABLET | Freq: Three times a day (TID) | ORAL | Status: DC
  Administered 2015-05-14 – 2015-05-22 (×25): 9000 [IU] via ORAL
  Filled 2015-05-14 (×28): qty 1

## 2015-05-14 NOTE — Significant Event (Signed)
Hypoglycemic Event  CBG: 63  Treatment: 15 GM carbohydrate snack  Symptoms: None  Follow-up CBG: Time:1618 CBG Result:73  Possible Reasons for Event: Unknown  Comments/MD notified:Patient's dinner arrived. Patient showed no s/s, continue to monitor.    Kennyth Arnold G  Remember to initiate Hypoglycemia Order Set & complete

## 2015-05-14 NOTE — Progress Notes (Signed)
Occupational Therapy Weekly Progress Note  Patient Details  Name: Anne Herrera MRN: [id number] Date of Birth: May 05, 1936  Beginning of progress report period: May 07, 2015 End of progress report period: May 14, 2015  Today's Date: 05/14/2015 OT Individual Time:0800-0900 60 Minutes  Patient has met 4 of 4 short term goals.  Pt making good progress with all goals relating to BADL and transfers.  Pt remains fearful of falls and requires instructional vc for safety during stand-pivot transfers due mildly impaired short-term memory and attention deficits.  Pt is making slow progress with NMR of RUE although now incorporating RUE as gross assist during functional tasks with min cues.  Patient continues to demonstrate the following deficits: Impaired dynamic standing balance, right hemiparesis, impaired mobility, impaired attention and problem-solving and therefore will continue to benefit from skilled OT intervention to enhance overall performance with BADL and iADL.  Patient progressing toward long term goals..  Continue plan of care.  OT Short Term Goals Week 1:  OT Short Term Goal 1 (Week 1): Pt will transfer to/from toilet with Mod A OT Short Term Goal 1 - Progress (Week 1): Met OT Short Term Goal 2 (Week 1): Pt will complete toileting with Min A for thoroughness OT Short Term Goal 2 - Progress (Week 1): Met OT Short Term Goal 3 (Week 1): Pt will bathe 10 of 10 body parts with Min A OT Short Term Goal 3 - Progress (Week 1): Met OT Short Term Goal 4 (Week 1): Pt will dress lower body with Mod A using AE OT Short Term Goal 4 - Progress (Week 1): Met Week 2:  OT Short Term Goal 1 (Week 2): STG=LTG d/t short remaining LOS  Skilled Therapeutic Interventions/Progress Updates: ADL-retraining with focus on improved dynamic standing balance, transfers, functional mobility using RW, safety awareness and adapted dressing skills.   Pt received in w/c and awaiting therapist for assist  with BADL.   Pt ambulates to bathroom from w/c using RW with setup to secure right hand orthosis and min guard assist while ambulating.  Pt demo'd mild LOB at threshold of bathroom but self-corrected independently.   Pt transfers to tub bench with standby assist and bathes with min assist to wash buttocks thoroughly.   Pt returns to w/c and transfers to EOB to dress with setup to provide clothing and AE.   Pt dons underwear and pants with steadying assist to stand and min instructional cues to use support of lower bed frame to improve stability while standing.   Pt grooms seated at sink in w/c.    Pt requires repeated cues to slow down and avoid compensatory elevation of right shoulder when using RUE during functional tasks.     Therapy Documentation Precautions:  Precautions Precautions: Fall Precaution Comments: Fell at [facility] IR while being assisted to toilet by RN Tech (w/o device) Restrictions Weight Bearing Restrictions: No   Vital Signs: Therapy Vitals Temp: 98.1 F (36.7 C) Temp Source: Oral Pulse Rate: 62 Resp: 20 BP: (!) 159/60 mmHg Patient Position (if appropriate): Sitting Oxygen Therapy SpO2: 95 % O2 Device: Not Delivered   Pain: No/denies pain     ADL: ADL ADL Comments: see Functional Assessment   See Function Navigator for Current Functional Status.   Therapy/Group: Individual Therapy   Second session: Time: 1330-1430 Time Calculation (min):  60 min  Pain Assessment: No/denies pain  Skilled Therapeutic Interventions: Therapeutic exercise with focus on improved AROM of RUE, strengthening, and improved awareness of compensatory deficits.  Using mirror to assist pt, pt performs 4 UE AROM exercises with manual facilitation to place RUE and reinforce proper form during exercise.   Pt limited to approx 45-60 of right shoulder flexion before flexor synergies interfere with movements at elbow and wrist.   Pt able to problem-solve 50% of the time during session and she  suggested using a table top to inhibit right elbow flexion while performing "door knob" exercise.   Pt re-educated on goals, progression of treatment, and OT recommendations for supervision assist at discharge.   Pt declared 100% agreement with current long-term goals as stated.   Pt request assist with toilet transfer in her room at end of session and was advised to alert RN tech using call light when finished.      See FIM for current functional status  Therapy/Group: Individual Therapy  [place] 05/14/2015, 2:54 PM

## 2015-05-14 NOTE — Progress Notes (Signed)
Patient ID: Anne Herrera, female   DOB: July 05, 1936, 79 y.o.   MRN: [id number] Subjective/Complaints:  Asking about d/c date, no knee pain Working with OT- per OT pt is nervous about going home  ROS- freq urination, , no pain c/os, no swallowing c/os, no SOB or CP  Objective: Vital Signs: Blood pressure 166/72, pulse 67, temperature 98.3 F (36.8 C), temperature source Oral, resp. rate 18, height 5\' 4"  (1.626 m), weight 84.369 kg (186 lb), SpO2 95 %. No results found. Results for orders placed or performed during the hospital encounter of 05/06/15 (from the past 72 hour(s))  Glucose, capillary     Status: None   Collection Time: 05/11/15 11:32 AM  Result Value Ref Range   Glucose-Capillary 82 65 - 99 mg/dL   Comment 1 Notify RN   Glucose, capillary     Status: None   Collection Time: 05/11/15  5:04 PM  Result Value Ref Range   Glucose-Capillary 69 65 - 99 mg/dL   Comment 1 Notify RN   Glucose, capillary     Status: None   Collection Time: 05/11/15  5:39 PM  Result Value Ref Range   Glucose-Capillary 93 65 - 99 mg/dL   Comment 1 Notify RN   Glucose, capillary     Status: Abnormal   Collection Time: 05/11/15  9:04 PM  Result Value Ref Range   Glucose-Capillary 128 (H) 65 - 99 mg/dL   Comment 1 Notify RN   Glucose, capillary     Status: Abnormal   Collection Time: 05/12/15  6:52 AM  Result Value Ref Range   Glucose-Capillary 130 (H) 65 - 99 mg/dL   Comment 1 Notify RN   Glucose, capillary     Status: None   Collection Time: 05/12/15 11:20 AM  Result Value Ref Range   Glucose-Capillary 87 65 - 99 mg/dL   Comment 1 Notify RN   Glucose, capillary     Status: None   Collection Time: 05/12/15  5:09 PM  Result Value Ref Range   Glucose-Capillary 76 65 - 99 mg/dL   Comment 1 Notify RN   Glucose, capillary     Status: None   Collection Time: 05/12/15  8:50 PM  Result Value Ref Range   Glucose-Capillary 88 65 - 99 mg/dL  Glucose, capillary     Status: Abnormal    Collection Time: 05/13/15  6:26 AM  Result Value Ref Range   Glucose-Capillary 122 (H) 65 - 99 mg/dL  Glucose, capillary     Status: None   Collection Time: 05/13/15 11:43 AM  Result Value Ref Range   Glucose-Capillary 77 65 - 99 mg/dL   Comment 1 Notify RN   Glucose, capillary     Status: None   Collection Time: 05/13/15  4:45 PM  Result Value Ref Range   Glucose-Capillary 78 65 - 99 mg/dL   Comment 1 Notify RN   Glucose, capillary     Status: None   Collection Time: 05/13/15  8:51 PM  Result Value Ref Range   Glucose-Capillary 84 65 - 99 mg/dL  Glucose, capillary     Status: Abnormal   Collection Time: 05/14/15  6:19 AM  Result Value Ref Range   Glucose-Capillary 117 (H) 65 - 99 mg/dL     HEENT: normal and no strabismus Cardio: RRR and no murmur Resp: CTA B/L GI: BS positive and NT, ND Extremity:  No Edema Skin:   Other psoriatic patch bilateral pre patellar Neuro: Alert/Oriented, Cranial Nerve Abnormalities  right central 7, Abnormal Sensory  R side, Abnormal Motor 3- R delt , bi, tri, grip, HF, KE,2- ADF, Abnormal FMC Ataxic/ dec FMC and Dysarthric Musc/Skel:  Other mild Right elbow flexion contracture Gen NAD   Assessment/Plan: 1. 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 Physiatrist is providing close team supervision and 24 hour management of active medical problems listed below. Physiatrist and rehab team continue to assess barriers to discharge/monitor patient progress toward functional and medical goals.  FIM: Function - Bathing Position: Shower Body parts bathed by patient: Right arm, Left arm, Chest, Abdomen, Front perineal area, Right upper leg, Left upper leg, Right lower leg, Left lower leg, Buttocks Body parts bathed by helper: Back Bathing not applicable: Buttocks, Left lower leg, Right lower leg, Right  arm Assist Level: Touching or steadying assistance(Pt > 75%)  Function- Upper Body Dressing/Undressing What is the patient wearing?: Bra, Pull over shirt/dress Bra - Perfomed by patient: Thread/unthread right bra strap, Thread/unthread left bra strap Bra - Perfomed by helper: Thread/unthread right bra strap, Thread/unthread left bra strap, Hook/unhook bra (pull down sports bra) Pull over shirt/dress - Perfomed by patient: Thread/unthread right sleeve, Thread/unthread left sleeve, Put head through opening, Pull shirt over trunk Pull over shirt/dress - Perfomed by helper: Pull shirt over trunk Assist Level: Supervision or verbal cues, Set up Set up : To obtain clothing/put away Function - Lower Body Dressing/Undressing What is the patient wearing?: Underwear, Pants, Socks, Shoes, AFO Position: Sitting EOB Underwear - Performed by patient: Thread/unthread right underwear leg, Thread/unthread left underwear leg, Pull underwear up/down Underwear - Performed by helper: Thread/unthread right underwear leg, Thread/unthread left underwear leg Pants- Performed by patient: Thread/unthread right pants leg, Thread/unthread left pants leg, Pull pants up/down, Fasten/unfasten pants (with reacher) Pants- Performed by helper: Thread/unthread right pants leg, Thread/unthread left pants leg, Pull pants up/down, Fasten/unfasten pants Socks - Performed by helper: Don/doff right sock, Don/doff left sock Shoes - Performed by patient: Don/doff left shoe, Fasten left Shoes - Performed by helper: Fasten right, Don/doff right shoe AFO - Performed by helper: Don/doff right AFO Assist Level: Touching or steadying assistance (Pt > 75%)  Function - Toileting Toileting steps completed by patient: Adjust clothing prior to toileting, Performs perineal hygiene, Adjust clothing after toileting Toileting steps completed by helper: Adjust clothing prior to toileting, Adjust clothing after toileting Toileting Assistive Devices:  Grab bar or rail Assist level: Touching or steadying assistance (Pt.75%)  Function - Toilet Transfers Toilet transfer assistive device: Elevated toilet seat/BSC over toilet, Bedside commode Mechanical lift: Stedy Assist level to toilet: Touching or steadying assistance (Pt > 75%) Assist level from toilet: Touching or steadying assistance (Pt > 75%) Assist level to bedside commode (at bedside): Moderate assist (Pt 50 - 74%/lift or lower) Assist level from bedside commode (at bedside): Touching or steadying assistance (Pt > 75%)  Function - Chair/bed transfer Chair/bed transfer method: Stand pivot Chair/bed transfer assist level: Touching or steadying assistance (Pt > 75%) Chair/bed transfer assistive device: Armrests, Walker, Orthosis Chair/bed transfer details: Verbal cues for technique, Manual facilitation for weight shifting  Function - Locomotion: Wheelchair Will patient use wheelchair at discharge?: Yes Type: Manual Max wheelchair distance: 25' Assist Level: Moderate assistance (Pt 50 - 74%) (forced use of R UE) Assist Level: Supervision or verbal cues Wheel 150 feet activity did not occur: Safety/medical concerns (low endurance - unable to complete) Assist Level:  Supervision or verbal cues Turns around,maneuvers to table,bed, and toilet,negotiates 3% grade,maneuvers on rugs and over doorsills: No Function - Locomotion: Ambulation Assistive device: Walker-rolling, Orthosis (R HO and R AFO) Max distance: 130 Assist level: Moderate assist (Pt 50 - 74%) Assist level: Moderate assist (Pt 50 - 74%) Walk 50 feet with 2 turns activity did not occur: N/A Assist level: Moderate assist (Pt 50 - 74%) Walk 150 feet activity did not occur: Safety/medical concerns (low endurance - unable) Walk 10 feet on uneven surfaces activity did not occur: Safety/medical concerns (R LE too unstable - unsafe)  Function - Comprehension Comprehension: Auditory Comprehension assist level: Understands  complex 90% of the time/cues 10% of the time  Function - Expression Expression: Verbal Expression assist level: Expresses complex 90% of the time/cues < 10% of the time  Function - Social Interaction Social Interaction assist level: Interacts appropriately with others with medication or extra time (anti-anxiety, antidepressant).  Function - Problem Solving Problem solving assist level: Solves basic problems with no assist  Function - Memory Memory assist level: More than reasonable amount of time Patient normally able to recall (first 3 days only): Current season, Location of own room, Staff names and faces, That he or she is in a hospital  Medical Problem List and Plan: 1. 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 DVT Prophylaxis/Anticoagulation: Pharmaceutical: Lovenox 3. Pain Management: Tylenol prn pain. 4. Mood: LCSW to follow for evaluation and support.  5. Neuropsych: This patient is capable of making decisions on her own behalf. 6. Skin/Wound Care: Routine pressure relief measures.  7. Fluids/Electrolytes/Nutrition: Monitor I/O.Eats 80-90% meals . enc fluid intake 8. HTN: Monitor bid. Continue coreg bid. Off HCTZ at this time. BP 166/72 , given extension of CVA, avoid hypotension, No orthostatic changes,may resume low dose  9. DM type 2: Monitor BS ac/hs. Control looks good Continue glipizide and metformin.Freq stools may be metformin side effect Use SSI for elevated BS and adjust medications as indicated.  glucose 117 this am 10. H/o Gout: Controlled off medications. Monitor for joint pain and swelling 11.  Psoriasis, local tx, has triamcinolone cream ordered LOS (Days) 8 A FACE TO FACE EVALUATION WAS PERFORMED  KIRSTEINS,ANDREW E 05/14/2015, 8:07 AM

## 2015-05-14 NOTE — Progress Notes (Signed)
Speech Language Pathology Discharge Summary  Patient Details  Name: Arijana Narayan MRN: [id number] Date of Birth: 07/21/1936    Patient has met 1 of 1 long term goals.  Patient to discharge at overall Modified Independent level.  Reasons goals not met: n/a   Clinical Impression/Discharge Summary:  Pt made functional gains while inpatient and is discharging from [facility] services having met her long term goal for use of speech intelligibility strategies.  Pt is mod I for use of slow rate, increased vocal intensity, and overarticulation and is 100% intelligible in conversations with all communication partners and across all contexts.  Pt and family education is complete.  As a result, no further ST needs are indicated at this time.    Care Partner:  Caregiver Able to Provide Assistance: Other (comment) (n/a)  Type of Caregiver Assistance:  (n/a)  Recommendation:  None      Equipment: none recommended by SLP    Reasons for discharge: Treatment goals met   Patient/Family Agrees with Progress Made and Goals Achieved: Yes   Function:  See care tool for level of function.   Krew Hortman, Selinda Orion 05/14/2015, 7:50 AM

## 2015-05-14 NOTE — Progress Notes (Signed)
Physical Therapy Session Note  Patient Details  Name: Anne Herrera MRN: [id number] Date of Birth: 14-Aug-1936  Today's Date: 05/14/2015 PT Individual Time: 1000-1100 PT Individual Time Calculation (min): 60 min   Short Term Goals: Week 2:  PT Short Term Goal 1 (Week 2): Pt will demonstrate all bed mobility req SBA.  PT Short Term Goal 2 (Week 2): Pt will ambulate >= 130' req min A.  PT Short Term Goal 3 (Week 2): Pt will participate in an AFO evaluation with orthotist.  PT Short Term Goal 4 (Week 2): Pt will demosntrate dynamic stand balance req min A.  PT Short Term Goal 5 (Week 2): Pt will tolerate 5 minutes of standing during an activity for improved activity tolerance.   Skilled Therapeutic Interventions/Progress Updates:    Pt received up in w/c with R trial AFO in place - agreeable to PT. Gait Training - see function tab for details - pt has 1 LOB req min A to prevent a fall - pt also assists in preventing fall by grabbing L wall rail with L hand. Therapeutic Exercise: PT instructs pt in R LE ROM and strengthening exercises: LAQ, heel slides/knee to chest, supine hip abduction/adduction, SLR, B knees side to side, hip ER/IR on B hook lie, low back stretch B knees to side (60 sec hold each side), side lie hip abduction, clam shell in side lie - AA/AROM as needed x 10 reps each. Therapeutic Activity: PT instructs pt in mat mobility - req SBA supine to/from sit, SBA and verbal cues for technique to roll L on mat, CGA to roll into prone and min A prone press ups AAROM 2 x 10 reps each. W/C Management: Pt reports she wishes to work on her arms. PT instructs pt in w/c propulsion with B UEs in modified technique: pushing primarily with R UE until w/c starts to turn, then pushing with both arms (L stronger than R) until w/c starts to turn, req mod cues. Pt unable to grade pushing with L arm appropriately to match R arm push for w/c to go straight. Pt ended up in w/c with all needs in reach. Cont  per PT POC.   Therapy Documentation Precautions:  Precautions Precautions: Fall Precaution Comments: Fell at [facility] while being assisted to toilet by RN Tech (w/o device) Restrictions Weight Bearing Restrictions: No  Pain: Pain Assessment Pain Assessment: No/denies pain   See Function Navigator for Current Functional Status.   Therapy/Group: Individual Therapy  Anne Herrera M 05/14/2015, 10:32 AM

## 2015-05-14 NOTE — Progress Notes (Signed)
Occupational Therapy Session Note  Patient Details  Name: Anne Herrera MRN: [id number] Date of Birth: 1936-01-14  Today's Date: 05/14/2015 OT Individual Time: 1130-1200 OT Individual Time Calculation (min): 30 min    Short Term Goals: Week 1:  OT Short Term Goal 1 (Week 1): Pt will transfer to/from toilet with Mod A OT Short Term Goal 2 (Week 1): Pt will complete toileting with Min A for thoroughness OT Short Term Goal 3 (Week 1): Pt will bathe 10 of 10 body parts with Min A OT Short Term Goal 4 (Week 1): Pt will dress lower body with Mod A using AE  Skilled Therapeutic Interventions/Progress Updates:    Pt seen for OT therapy session focusing on neuromuscular re-education. Pt up walking with NT upon arrival having just finished toileting task. She stood at sink with supervision to wash hands and returned to w/c with min A using RW.  Education and demonstration provided regarding self propelling w/c using B LEs and then using B UEs. Pt demonstrated ability to propel w/c using B extremities with increased significantly increased time and VCs for technique. Theraband placed around R rim tire in order to increase traction for functional grasp on wheel in order to propel with R UE- pt voiced and demonstrated increased success with propulsion following modifications. She required rest break during w/c propulsion due to decreased functional activity tolerance, able to propel ~10 ft before requiring rest break.  In therapy gym, pt completed clothes pin trees, able to place 2 light resistance clothes pins with increased time. She demonstrated decreased in-hand manipulation skills and decreased grip strength, however, with increased time was able to manipulate and manage clothes pins. Pt returned to room at end of session, and transferred into recliner. Left sitting in recliner with all needs in reach.   Therapy Documentation Precautions:  Precautions Precautions: Fall Precaution Comments:  Fell at [facility] while being assisted to toilet by RN Tech (w/o device) Restrictions Weight Bearing Restrictions: No Pain: Pain Assessment Pain Assessment: No/denies pain ADL: ADL ADL Comments: see Functional Assessment  See Function Navigator for Current Functional Status.   Therapy/Group: Individual Therapy  Lewis, Rachelann Enloe C 05/14/2015, 12:19 PM

## 2015-05-14 NOTE — Progress Notes (Signed)
Social Work Patient ID: Anne Herrera, female   DOB: 09-Aug-1936, 79 y.o.   MRN: [id number] Met with pt and left message for daughter to discuss team conference goals-supervision/min level of assist and target discharge one more week.  Pt has said all along she wants To go to a SNF upon discharge from rehab for additional therapies and care.  She has named Anne Herrera for this worker to pursue. She is aware will need to get insurance on board Will begin FL2 and await daughter's return call.

## 2015-05-14 NOTE — Progress Notes (Signed)
Orthopedic Tech Progress Note Patient Details:  Anne Herrera April 13, 1936 [id number]  Patient ID: Anne Herrera, female   DOB: [birth year], 79 y.o.   MRN: [id number]   Anne Herrera 05/14/2015, 9:31 AMCalled hanger for right AFO Brace.

## 2015-05-15 ENCOUNTER — Inpatient Hospital Stay (HOSPITAL_COMMUNITY): Payer: Medicare PPO | Admitting: Physical Therapy

## 2015-05-15 ENCOUNTER — Inpatient Hospital Stay (HOSPITAL_COMMUNITY): Payer: Medicare PPO

## 2015-05-15 LAB — GLUCOSE, CAPILLARY
GLUCOSE-CAPILLARY: 122 mg/dL — AB (ref 65–99)
GLUCOSE-CAPILLARY: 87 mg/dL (ref 65–99)
GLUCOSE-CAPILLARY: 74 mg/dL (ref 65–99)
Glucose-Capillary: 162 mg/dL — ABNORMAL HIGH (ref 65–99)
Glucose-Capillary: 92 mg/dL (ref 65–99)

## 2015-05-15 NOTE — Progress Notes (Signed)
Patient ID: Anne Herrera, female   DOB: 05-03-1936, 79 y.o.   MRN: [id number] Subjective/Complaints:  Slept poorly up a lot last noc with urination Restarted on diuretic yesterday  ROS- freq urination, , no pain c/os, no swallowing c/os, no SOB or CP  Objective: Vital Signs: Blood pressure 148/72, pulse 68, temperature 98.5 F (36.9 C), temperature source Oral, resp. rate 20, height  (1.626 m), weight 84.369 kg (186 lb), SpO2 99 %. No results found. Results for orders placed or performed during the hospital encounter of 05/06/15 (from the past 72 hour(s))  Glucose, capillary     Status: None   Collection Time: 05/12/15 11:20 AM  Result Value Ref Range   Glucose-Capillary 87 65 - 99 mg/dL   Comment 1 Notify RN   Glucose, capillary     Status: None   Collection Time: 05/12/15  5:09 PM  Result Value Ref Range   Glucose-Capillary 76 65 - 99 mg/dL   Comment 1 Notify RN   Glucose, capillary     Status: None   Collection Time: 05/12/15  8:50 PM  Result Value Ref Range   Glucose-Capillary 88 65 - 99 mg/dL  Glucose, capillary     Status: Abnormal   Collection Time: 05/13/15  6:26 AM  Result Value Ref Range   Glucose-Capillary 122 (H) 65 - 99 mg/dL  Glucose, capillary     Status: None   Collection Time: 05/13/15 11:43 AM  Result Value Ref Range   Glucose-Capillary 77 65 - 99 mg/dL   Comment 1 Notify RN   Glucose, capillary     Status: None   Collection Time: 05/13/15  4:45 PM  Result Value Ref Range   Glucose-Capillary 78 65 - 99 mg/dL   Comment 1 Notify RN   Glucose, capillary     Status: None   Collection Time: 05/13/15  8:51 PM  Result Value Ref Range   Glucose-Capillary 84 65 - 99 mg/dL  Glucose, capillary     Status: Abnormal   Collection Time: 05/14/15  6:19 AM  Result Value Ref Range   Glucose-Capillary 117 (H) 65 - 99 mg/dL  Glucose, capillary     Status: Abnormal   Collection Time: 05/14/15 11:10 AM  Result Value Ref Range   Glucose-Capillary 183 (H) 65 -  99 mg/dL  Glucose, capillary     Status: Abnormal   Collection Time: 05/14/15  4:05 PM  Result Value Ref Range   Glucose-Capillary 63 (L) 65 - 99 mg/dL  Glucose, capillary     Status: None   Collection Time: 05/14/15  4:18 PM  Result Value Ref Range   Glucose-Capillary 73 65 - 99 mg/dL  Glucose, capillary     Status: None   Collection Time: 05/14/15 10:02 PM  Result Value Ref Range   Glucose-Capillary 86 65 - 99 mg/dL  Glucose, capillary     Status: Abnormal   Collection Time: 05/15/15 12:08 AM  Result Value Ref Range   Glucose-Capillary 162 (H) 65 - 99 mg/dL  Glucose, capillary     Status: Abnormal   Collection Time: 05/15/15  6:17 AM  Result Value Ref Range   Glucose-Capillary 122 (H) 65 - 99 mg/dL     HEENT: normal and no strabismus Cardio: RRR and no murmur Resp: CTA B/L GI: BS positive and NT, ND Extremity:  No Edema Skin:   Other psoriatic patch bilateral pre patellar Neuro: Alert/Oriented, Cranial Nerve Abnormalities right central 7, Abnormal Sensory  R side, Abnormal Motor  3- R delt , bi, tri, grip, HF, KE,2- ADF, Abnormal FMC Ataxic/ dec FMC and Dysarthric Musc/Skel:  Other mild Right elbow flexion contracture Gen NAD   Assessment/Plan: 1. 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 Physiatrist is providing close team supervision and 24 hour management of active medical problems listed below. Physiatrist and rehab team continue to assess barriers to discharge/monitor patient progress toward functional and medical goals.  FIM: Function - Bathing Position: Shower Body parts bathed by patient: Right arm, Left arm, Chest, Abdomen, Front perineal area, Right upper leg, Left upper leg, Right lower leg, Left lower leg Body parts bathed by helper: Back, Buttocks Bathing not applicable: Buttocks, Left lower leg, Right  lower leg, Right arm Assist Level: Touching or steadying assistance(Pt > 75%)  Function- Upper Body Dressing/Undressing What is the patient wearing?: Pull over shirt/dress, Bra Bra - Perfomed by patient: Thread/unthread right bra strap, Thread/unthread left bra strap Bra - Perfomed by helper: Hook/unhook bra (pull down sports bra) Pull over shirt/dress - Perfomed by patient: Thread/unthread right sleeve, Thread/unthread left sleeve, Put head through opening, Pull shirt over trunk Pull over shirt/dress - Perfomed by helper: Pull shirt over trunk Assist Level: Set up Set up : To obtain clothing/put away Function - Lower Body Dressing/Undressing What is the patient wearing?: Pants, Shoes, Underwear, AFO Position: Sitting EOB Underwear - Performed by patient: Thread/unthread right underwear leg, Thread/unthread left underwear leg, Pull underwear up/down Underwear - Performed by helper: Thread/unthread right underwear leg, Thread/unthread left underwear leg Pants- Performed by patient: Thread/unthread right pants leg, Thread/unthread left pants leg, Pull pants up/down, Fasten/unfasten pants Pants- Performed by helper: Thread/unthread right pants leg, Thread/unthread left pants leg, Pull pants up/down, Fasten/unfasten pants Socks - Performed by helper: Don/doff right sock, Don/doff left sock Shoes - Performed by patient: Don/doff left shoe, Fasten left Shoes - Performed by helper: Don/doff right shoe, Fasten right AFO - Performed by helper: Don/doff right AFO Assist Level: Touching or steadying assistance (Pt > 75%)  Function - Toileting Toileting steps completed by patient: Adjust clothing prior to toileting, Performs perineal hygiene, Adjust clothing after toileting Toileting steps completed by helper: Adjust clothing prior to toileting, Adjust clothing after toileting Toileting Assistive Devices: Grab bar or rail Assist level: Touching or steadying assistance (Pt.75%)  Function - Transport planner transfer assistive device: Elevated toilet seat/BSC over toilet, Grab bar, Walker Mechanical lift: Stedy Assist level to toilet: Touching or steadying assistance (Pt > 75%) Assist level from toilet: Touching or steadying assistance (Pt > 75%) Assist level to bedside commode (at bedside): Moderate assist (Pt 50 - 74%/lift or lower) Assist level from bedside commode (at bedside): Touching or steadying assistance (Pt > 75%)  Function - Chair/bed transfer Chair/bed transfer method: Stand pivot Chair/bed transfer assist level: Moderate assist (Pt 50 - 74%/lift or lower) Chair/bed transfer assistive device: Orthosis (R AFO) Chair/bed transfer details: Manual facilitation for weight shifting, Verbal cues for technique, Verbal cues for sequencing  Function - Locomotion: Wheelchair Will patient use wheelchair at discharge?: Yes Type: Manual Max wheelchair distance: 68' (with B UEs for R arm NMR) Assist Level: Touching or steadying assistance (Pt > 75%) Assist Level: Supervision or verbal cues Wheel 150 feet activity did not occur: Safety/medical concerns (low endurance - unable to complete) Assist Level: Supervision or verbal cues Turns around,maneuvers to table,bed, and toilet,negotiates 3% grade,maneuvers on rugs and over  doorsills: No Function - Locomotion: Ambulation Assistive device: Walker-rolling, Orthosis (R AFO and R HO) Max distance: 130 Assist level: Touching or steadying assistance (Pt > 75%) Assist level: Touching or steadying assistance (Pt > 75%) Walk 50 feet with 2 turns activity did not occur: N/A Assist level: Touching or steadying assistance (Pt > 75%) Walk 150 feet activity did not occur: Safety/medical concerns (low endurance - unable) Assist level: Touching or steadying assistance (Pt > 75%) Walk 10 feet on uneven surfaces activity did not occur: Safety/medical concerns (R LE too unstable - unsafe)  Function - Comprehension Comprehension:  Auditory Comprehension assist level: Follows complex conversation/direction with no assist  Function - Expression Expression: Verbal Expression assist level: Expresses basic needs/ideas: With extra time/assistive device  Function - Social Interaction Social Interaction assist level: Interacts appropriately with others with medication or extra time (anti-anxiety, antidepressant).  Function - Problem Solving Problem solving assist level: Solves basic 90% of the time/requires cueing < 10% of the time  Function - Memory Memory assist level: More than reasonable amount of time Patient normally able to recall (first 3 days only): Current season, Location of own room, Staff names and faces, That he or she is in a hospital  Medical Problem List and Plan: 1. 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 DVT Prophylaxis/Anticoagulation: Pharmaceutical: Lovenox 3. Pain Management: Tylenol prn pain. 4. Mood: LCSW to follow for evaluation and support.  5. Neuropsych: This patient is capable of making decisions on her own behalf. 6. Skin/Wound Care: Routine pressure relief measures.  7. Fluids/Electrolytes/Nutrition: Monitor I/O.Eats 100% meals . enc fluid intake 8. HTN: Monitor bid. Continue coreg bid.Back on low dose chlothalidone. BP 148/72 , given extension of CVA, avoid hypotension, No orthostatic changes, may need to restart cozaar 9. DM type 2: Monitor BS ac/hs.  Continue glipizide and metformin.Freq stools may be metformin side effect Use SSI for elevated BS and adjust medications as indicated.  glucose 122 this am, pt seems concerned regarding "ups and downs" but control is good 10. H/o Gout: Controlled off medications. Monitor for joint pain and swelling 11.  Psoriasis,  local tx, has triamcinolone cream ordered LOS (Days) 9 A FACE TO FACE EVALUATION WAS PERFORMED  KIRSTEINS,ANDREW E 05/15/2015, 7:24 AM

## 2015-05-15 NOTE — Progress Notes (Signed)
Physical Therapy Session Note  Patient Details  Name: Anne Herrera MRN: [id number] Date of Birth: 1936/01/28  Today's Date: 05/15/2015 PT Individual Time: 1500-1530 PT Individual Time Calculation (min): 30 min   Short Term Goals: Week 2:  PT Short Term Goal 1 (Week 2): Pt will demonstrate all bed mobility req SBA.  PT Short Term Goal 2 (Week 2): Pt will ambulate >= 130' req min A.  PT Short Term Goal 3 (Week 2): Pt will participate in an AFO evaluation with orthotist.  PT Short Term Goal 4 (Week 2): Pt will demosntrate dynamic stand balance req min A.  PT Short Term Goal 5 (Week 2): Pt will tolerate 5 minutes of standing during an activity for improved activity tolerance.   Skilled Therapeutic Interventions/Progress Updates:   Session focused on neuromuscular re-education with focus on forced use, increasing RLE weightbearing with sit <> stand and standing tasks, increasing anterior-posterior weight shifting, timing and sequencing, sustained activation, and  fucntional use RUE from slightly raised mat, reaching forward outside BOS using RUE to grasp/retrieve object in squat position, sit <> stand from low mat without UE support to fatigue, and gait training using RW x 100 ft with R AFO donned, min A overall and max verbal/tactile cues for reciprocal gait and decreasing lateral trunk lean to L in order to clear/advance RLE. Patient performed stand pivot transfer with min A and left sitting in recliner with all needs within reach, set up with UE HEP.   Therapy Documentation Precautions:  Precautions Precautions: Fall Precaution Comments: Fell at [facility] IR while being assisted to toilet by RN Tech (w/o device) Restrictions Weight Bearing Restrictions: No Pain: Pain Assessment Pain Assessment: No/denies pain   See Function Navigator for Current Functional Status.   Therapy/Group: Individual Therapy  Kerney Elbe 05/15/2015, 3:45 PM

## 2015-05-15 NOTE — Progress Notes (Signed)
Occupational Therapy Session Note  Patient Details  Name: Anne Herrera MRN: [id number] Date of Birth: [birth year]  Today's Date: 05/15/2015 OT Individual Time: 4098-1191 OT Individual Time Calculation (min): 75 min   Short Term Goals: Week 2:  OT Short Term Goal 1 (Week 2): STG=LTG d/t short remaining LOS  Skilled Therapeutic Interventions/Progress Updates: ADL-retraining with focus on memory, dynamic standing balance, AE training.   Pt received seated in recliner, conversing with MD and awaiting therapist for planned BADL session.   After setup to provide RW, pt rises with steadying assist and ambulates to bathroom with contact guard.   Pt transfers to tub bench with supervision and bathes with min assist to wash buttocks thoroughly.   Pt recovers to w/c to rest after bathing and dresses near EOB, using RW for supported standing as therapist assists to pull up pants.   Pt educated on use of sock aid this date, after setup from therapist to apply sock to device.   Pt dons both socks after instruction and was provided "loaner" sock aid to practice applying sock using both hands during the weekend.   Pt able to self-propel w/c to sink to complete all grooming unassisted at end of session.   Pt is overall min assist with BADL.   Plan to discus use of lift chair and tub bench with peri-area cut-out during next planned session.     Therapy Documentation Precautions:  Precautions Precautions: Fall Precaution Comments: Fell at [facility] IR while being assisted to toilet by [employer] (w/o device) Restrictions Weight Bearing Restrictions: No   Vital Signs: Therapy Vitals Temp: 98.5 F (36.9 C) Temp Source: Oral Pulse Rate: 68 Resp: 20 BP: (!) 148/72 mmHg Patient Position (if appropriate): Lying Oxygen Therapy SpO2: 99 % O2 Device: Not Delivered   Pain: Pain Assessment Pain Assessment: No/denies pain   ADL: ADL ADL Comments: see Functional Assessment   See Function Navigator for  Current Functional Status.   Therapy/Group: Individual Therapy  Anne Herrera 05/15/2015, 8:51 AM

## 2015-05-15 NOTE — Progress Notes (Signed)
Physical Therapy Session Note  Patient Details  Name: Anne Herrera MRN: [id number] Date of Birth: 1936-08-11  Today's Date: 05/15/2015  PT Individual Time: 0930-1030 Treatment Session 2: 1300 - 1330 PT Individual Time Calculation (min): 60 min Treatment Session 2: 30 min  Short Term Goals: Week 2:  PT Short Term Goal 1 (Week 2): Pt will demonstrate all bed mobility req SBA.  PT Short Term Goal 2 (Week 2): Pt will ambulate >= 130' req min A.  PT Short Term Goal 3 (Week 2): Pt will participate in an AFO evaluation with orthotist.  PT Short Term Goal 4 (Week 2): Pt will demosntrate dynamic stand balance req min A.  PT Short Term Goal 5 (Week 2): Pt will tolerate 5 minutes of standing during an activity for improved activity tolerance.   Skilled Therapeutic Interventions/Progress Updates:    Treatment Session 1: Pt received up in w/c with R Sheryn Bison Walk On brace in place (borrowed). Gait Training - see function tab for details. Pt has 2 LOB req min A to correct. Pt is able to begin ambulation with close SBA, but progresses to steadying assist quickly with fatigue. See function tab for stair negotiation - PT instructs pt to ascend steps leading with R LE for therapeutic strengthening of R LE, but to descent 3" steps with L LE to continue with R knee control rehab and on 6" steps to descend with weak/R leg for safety - req steadying assist. Pt intermittently uses 2 handrails, to 1 handrail (R only) in an effort to increase weightbearing and therapeutic use of R UE with stairs. Orthotic Fit/Management - Radiation protection practitioner from [place] [facility] arrived and analyzed gait with and without AFO and PT and orthotist decide Walk On AFO with heel wedge will suit pt's needs best. Pt ambulates with these orthotics and verbalizes satisfaction with these braces. W/C Management: PT instructs pt in w/c propulsion with B UEs req verbal cues/supervision for improved R UE therapeutic use x 39' with increased time. Pt  ended by bedside with all needs in reach.   Treatment Session 2: Pt received up in w/c with personal AFO on. Gait Training - PT instructs pt in ambulation from room to gym with R AFO, after removing R HO from walker and pt demonstrates a good hand grip on RW the entire time - 3-4 small LOB req same level of assist as before. Therapeutic Exercise - PT creates a written handout of HEP for pt and she demonstrates understanding with handout use: LAQ, seated knee flexion, SLR, heel slides, lumbar rotation/B knees side to side, hip ER/IR to neutral in hook lie, supine hip abduction/adduction, ankle alphabet (only once through the alphabet): x 10 reps R leg. Pt is continuing to progress with mobility and now has a written program for home use. Continue per PT POC.    Therapy Documentation Precautions:  Precautions Precautions: Fall Precaution Comments: Fell at [facility] IR while being assisted to toilet by [employer] (w/o device) Restrictions Weight Bearing Restrictions: No Pain: Pain Assessment Pain Assessment: 0-10 Pain Score: 10-Worst pain ever Pain Type: Acute pain Pain Location: Leg Pain Orientation: Left Pain Descriptors / Indicators: Burning (from compensating for weak R leg) Pain Onset: Gradual Pain Intervention(s): Rest Multiple Pain Sites: No Treatment Session 2: Pt denies pain.    See Function Navigator for Current Functional Status.   Therapy/Group: Individual Therapy  HYSLOP,AMANDA M 05/15/2015, 10:18 AM

## 2015-05-15 NOTE — Progress Notes (Signed)
RT added sterile water to CPAP and placed patient on CPAP. Patient is resting comfortably.

## 2015-05-16 ENCOUNTER — Inpatient Hospital Stay (HOSPITAL_COMMUNITY): Payer: Medicare PPO

## 2015-05-16 ENCOUNTER — Inpatient Hospital Stay (HOSPITAL_COMMUNITY): Payer: Medicare PPO | Admitting: Physical Therapy

## 2015-05-16 LAB — URINALYSIS, ROUTINE W REFLEX MICROSCOPIC
Bilirubin Urine: NEGATIVE
GLUCOSE, UA: NEGATIVE mg/dL
HGB URINE DIPSTICK: NEGATIVE
Ketones, ur: NEGATIVE mg/dL
Nitrite: NEGATIVE
PH: 6.5 (ref 5.0–8.0)
Protein, ur: NEGATIVE mg/dL
Specific Gravity, Urine: 1.008 (ref 1.005–1.030)
Urobilinogen, UA: 0.2 mg/dL (ref 0.0–1.0)

## 2015-05-16 LAB — URINE MICROSCOPIC-ADD ON

## 2015-05-16 LAB — GLUCOSE, CAPILLARY
GLUCOSE-CAPILLARY: 95 mg/dL (ref 65–99)
GLUCOSE-CAPILLARY: 111 mg/dL — AB (ref 65–99)
Glucose-Capillary: 112 mg/dL — ABNORMAL HIGH (ref 65–99)
Glucose-Capillary: 77 mg/dL (ref 65–99)

## 2015-05-16 MED ORDER — LOSARTAN POTASSIUM 25 MG PO TABS
25.0000 mg | ORAL_TABLET | Freq: Every day | ORAL | Status: DC
  Administered 2015-05-16 – 2015-05-22 (×7): 25 mg via ORAL
  Filled 2015-05-16 (×7): qty 1

## 2015-05-16 NOTE — Progress Notes (Signed)
Occupational Therapy Session Note  Patient Details  Name: Anne Herrera MRN: [id number] Date of Birth: 1936/01/15  Today's Date: 05/16/2015 OT Individual Time: 1330-1400 OT Individual Time Calculation (min): 30 min    Short Term Goals: Week 1:  OT Short Term Goal 1 (Week 1): Pt will transfer to/from toilet with Mod A OT Short Term Goal 1 - Progress (Week 1): Met OT Short Term Goal 2 (Week 1): Pt will complete toileting with Min A for thoroughness OT Short Term Goal 2 - Progress (Week 1): Met OT Short Term Goal 3 (Week 1): Pt will bathe 10 of 10 body parts with Min A OT Short Term Goal 3 - Progress (Week 1): Met OT Short Term Goal 4 (Week 1): Pt will dress lower body with Mod A using AE OT Short Term Goal 4 - Progress (Week 1): Met  Skilled Therapeutic Interventions/Progress Updates:    Pt seen for 1:1 OT session with focus on R NMR, functional mobility, and activity tolerance. Pt received sitting in recliner chair. Ambulated apprx 50' in hallway with CGA using RW and min cues for safety. Engaged in grasp/release activity with cones as pt reached across midline to retrieve then extend at elbow to place. Completed large peg board task with focus on sustain grasp and controlled release into target. Pt able to manipulate pegs slightly, however required assist from therapist to position upright in hand. Pt returned to room and left sitting in w/c with NT present.   Therapy Documentation Precautions:  Precautions Precautions: Fall Precaution Comments: Fell at [facility] IR while being assisted to toilet by [employer] (w/o device) Restrictions Weight Bearing Restrictions: No General:   Vital Signs: Therapy Vitals Temp: 98 F (36.7 C) Temp Source: Oral Pulse Rate: 63 Resp: 18 BP: (!) 125/48 mmHg Patient Position (if appropriate): Sitting Oxygen Therapy SpO2: 100 % O2 Device: Not Delivered Pain:   ADL: ADL ADL Comments: see Functional Assessment Exercises:   Other Treatments:     See Function Navigator for Current Functional Status.   Therapy/Group: Individual Therapy  Duayne Cal 05/16/2015, 2:37 PM

## 2015-05-16 NOTE — Progress Notes (Signed)
Patient placed on CPAP of 6. No O2 bleed in needed. Patient tolerating well. Sat 100%. RT will continue to monitor as needed.  

## 2015-05-16 NOTE — Progress Notes (Signed)
Patient ID: Anessa Charley, female   DOB: 1935-09-14, 79 y.o.   MRN: [id number] Subjective/Complaints:  Discussed good CBGs Discussed BP control and restarting Cozaar  ROS- freq urination, , no pain c/os, no swallowing c/os, no SOB or CP  Objective: Vital Signs: Blood pressure 163/70, pulse 78, temperature 98.8 F (37.1 C), temperature source Oral, resp. rate 18, height  (1.626 m), weight 84.369 kg (186 lb), SpO2 100 %. No results found. Results for orders placed or performed during the hospital encounter of 05/06/15 (from the past 72 hour(s))  Glucose, capillary     Status: None   Collection Time: 05/13/15 11:43 AM  Result Value Ref Range   Glucose-Capillary 77 65 - 99 mg/dL   Comment 1 Notify RN   Glucose, capillary     Status: None   Collection Time: 05/13/15  4:45 PM  Result Value Ref Range   Glucose-Capillary 78 65 - 99 mg/dL   Comment 1 Notify RN   Glucose, capillary     Status: None   Collection Time: 05/13/15  8:51 PM  Result Value Ref Range   Glucose-Capillary 84 65 - 99 mg/dL  Glucose, capillary     Status: Abnormal   Collection Time: 05/14/15  6:19 AM  Result Value Ref Range   Glucose-Capillary 117 (H) 65 - 99 mg/dL  Glucose, capillary     Status: Abnormal   Collection Time: 05/14/15 11:10 AM  Result Value Ref Range   Glucose-Capillary 183 (H) 65 - 99 mg/dL  Glucose, capillary     Status: Abnormal   Collection Time: 05/14/15  4:05 PM  Result Value Ref Range   Glucose-Capillary 63 (L) 65 - 99 mg/dL  Glucose, capillary     Status: None   Collection Time: 05/14/15  4:18 PM  Result Value Ref Range   Glucose-Capillary 73 65 - 99 mg/dL  Glucose, capillary     Status: None   Collection Time: 05/14/15 10:02 PM  Result Value Ref Range   Glucose-Capillary 86 65 - 99 mg/dL  Glucose, capillary     Status: Abnormal   Collection Time: 05/15/15 12:08 AM  Result Value Ref Range   Glucose-Capillary 162 (H) 65 - 99 mg/dL  Glucose, capillary     Status: Abnormal    Collection Time: 05/15/15  6:17 AM  Result Value Ref Range   Glucose-Capillary 122 (H) 65 - 99 mg/dL  Glucose, capillary     Status: None   Collection Time: 05/15/15 11:11 AM  Result Value Ref Range   Glucose-Capillary 87 65 - 99 mg/dL  Glucose, capillary     Status: None   Collection Time: 05/15/15  4:11 PM  Result Value Ref Range   Glucose-Capillary 74 65 - 99 mg/dL  Glucose, capillary     Status: None   Collection Time: 05/15/15  9:17 PM  Result Value Ref Range   Glucose-Capillary 92 65 - 99 mg/dL   Comment 1 Notify RN   Glucose, capillary     Status: None   Collection Time: 05/16/15  6:38 AM  Result Value Ref Range   Glucose-Capillary 95 65 - 99 mg/dL     HEENT: normal and no strabismus Cardio: RRR and no murmur Resp: CTA B/L GI: BS positive and NT, ND Extremity:  No Edema Skin:   Other psoriatic patch bilateral pre patellar Neuro: Alert/Oriented, Cranial Nerve Abnormalities right central 7, Abnormal Sensory  R side, Abnormal Motor 3- R delt , bi, tri, grip, HF, KE,2- ADF, Abnormal [facility]  Ataxic/ dec FMC and Dysarthric Musc/Skel:  Other mild Right elbow flexion contracture Gen NAD   Assessment/Plan: 1. 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 Physiatrist is providing close team supervision and 24 hour management of active medical problems listed below. Physiatrist and rehab team continue to assess barriers to discharge/monitor patient progress toward functional and medical goals.  FIM: Function - Bathing Position: Shower Body parts bathed by patient: Right arm, Left arm, Chest, Abdomen, Front perineal area, Right upper leg, Left upper leg, Right lower leg, Left lower leg Body parts bathed by helper: Back, Buttocks Bathing not applicable: Buttocks, Left lower leg, Right lower leg, Right arm Assist Level: Touching or  steadying assistance(Pt > 75%)  Function- Upper Body Dressing/Undressing What is the patient wearing?: Bra, Pull over shirt/dress Bra - Perfomed by patient: Thread/unthread right bra strap, Thread/unthread left bra strap Bra - Perfomed by helper: Hook/unhook bra (pull down sports bra) Pull over shirt/dress - Perfomed by patient: Thread/unthread right sleeve, Thread/unthread left sleeve, Put head through opening, Pull shirt over trunk Pull over shirt/dress - Perfomed by helper: Pull shirt over trunk Assist Level: Touching or steadying assistance(Pt > 75%) Set up : To obtain clothing/put away Function - Lower Body Dressing/Undressing What is the patient wearing?: AFO, Shoes, Socks, Underwear, Pants Position: Wheelchair/chair at sink Underwear - Performed by patient: Thread/unthread right underwear leg, Thread/unthread left underwear leg, Pull underwear up/down Underwear - Performed by helper: Thread/unthread right underwear leg, Thread/unthread left underwear leg Pants- Performed by patient: Thread/unthread right pants leg, Thread/unthread left pants leg, Pull pants up/down, Fasten/unfasten pants Pants- Performed by helper: Thread/unthread right pants leg, Thread/unthread left pants leg, Pull pants up/down, Fasten/unfasten pants Socks - Performed by patient: Don/doff right sock, Don/doff left sock Socks - Performed by helper: Don/doff right sock, Don/doff left sock Shoes - Performed by patient: Don/doff right shoe Shoes - Performed by helper: Fasten right, Don/doff left shoe, Fasten left AFO - Performed by helper: Don/doff left AFO Assist Level: Touching or steadying assistance (Pt > 75%)  Function - Toileting Toileting steps completed by patient: Adjust clothing prior to toileting, Performs perineal hygiene, Adjust clothing after toileting Toileting steps completed by helper: Adjust clothing prior to toileting, Adjust clothing after toileting Toileting Assistive Devices: Grab bar or  rail Assist level: Touching or steadying assistance (Pt.75%)  Function - Toilet Transfers Toilet transfer assistive device: Elevated toilet seat/BSC over toilet, Grab bar, Walker Mechanical lift: Stedy Assist level to toilet: Touching or steadying assistance (Pt > 75%) Assist level from toilet: Touching or steadying assistance (Pt > 75%) Assist level to bedside commode (at bedside): Touching or steadying assistance (Pt > 75%) Assist level from bedside commode (at bedside): Touching or steadying assistance (Pt > 75%)  Function - Chair/bed transfer Chair/bed transfer method: Ambulatory, Stand pivot Chair/bed transfer assist level: Touching or steadying assistance (Pt > 75%) Chair/bed transfer assistive device: Walker, Orthosis Chair/bed transfer details: Manual facilitation for weight shifting, Verbal cues for technique  Function - Locomotion: Wheelchair Will patient use wheelchair at discharge?: Yes (pt fatigues quickly) Type: Manual Max wheelchair distance: 76' Assist Level: Supervision or verbal cues Assist Level: Supervision or verbal cues Wheel 150 feet activity did not occur: Safety/medical concerns (low endurance - unable to complete) Assist Level: Supervision or verbal cues Turns around,maneuvers to table,bed, and toilet,negotiates 3% grade,maneuvers on rugs and over doorsills: No Function - Locomotion: Ambulation Assistive device:  Walker-rolling, Orthosis Max distance: 100 ft Assist level: Touching or steadying assistance (Pt > 75%) Assist level: Touching or steadying assistance (Pt > 75%) Walk 50 feet with 2 turns activity did not occur: N/A Assist level: Touching or steadying assistance (Pt > 75%) Walk 150 feet activity did not occur: Safety/medical concerns (low endurance - unable) Assist level: Touching or steadying assistance (Pt > 75%) Walk 10 feet on uneven surfaces activity did not occur: Safety/medical concerns (R LE too unstable - unsafe)  Function -  Comprehension Comprehension: Auditory Comprehension assist level: Understands complex 90% of the time/cues 10% of the time  Function - Expression Expression: Verbal Expression assist level: Expresses complex 90% of the time/cues < 10% of the time  Function - Social Interaction Social Interaction assist level: Interacts appropriately with others - No medications needed.  Function - Problem Solving Problem solving assist level: Solves complex 90% of the time/cues < 10% of the time  Function - Memory Memory assist level: Recognizes or recalls 90% of the time/requires cueing < 10% of the time Patient normally able to recall (first 3 days only): Current season, Location of own room, Staff names and faces, That he or she is in a hospital  Medical Problem List and Plan: 1. 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 DVT Prophylaxis/Anticoagulation: Pharmaceutical: Lovenox 3. Pain Management: Tylenol prn pain. 4. Mood: LCSW to follow for evaluation and support.  5. Neuropsych: This patient is capable of making decisions on her own behalf. 6. Skin/Wound Care: Routine pressure relief measures.  7. Fluids/Electrolytes/Nutrition: Monitor I/O.Eats 100% meals . enc fluid intake 8. HTN: Monitor bid. Continue coreg bid.Back on low dose chlothalidone. BP 163/70 , given extension of CVA, avoid hypotension, No orthostatic changes, restart cozaar 9. DM type 2: Monitor BS ac/hs.  Continue glipizide and metformin.Freq stools may be metformin side effect Use SSI for elevated BS and adjust medications as indicated.  glucose 95 this am,control is good 10. H/o Gout: Controlled off medications. Monitor for joint pain and swelling 11.  Psoriasis, local tx, has triamcinolone cream ordered LOS (Days)  10 A FACE TO FACE EVALUATION WAS PERFORMED  KIRSTEINS,ANDREW E 05/16/2015, 9:20 AM

## 2015-05-17 ENCOUNTER — Inpatient Hospital Stay (HOSPITAL_COMMUNITY): Payer: Medicare PPO | Admitting: Physical Therapy

## 2015-05-17 ENCOUNTER — Ambulatory Visit (HOSPITAL_COMMUNITY): Payer: Medicare PPO | Admitting: Occupational Therapy

## 2015-05-17 ENCOUNTER — Inpatient Hospital Stay (HOSPITAL_COMMUNITY): Payer: Medicare PPO | Admitting: Occupational Therapy

## 2015-05-17 DIAGNOSIS — E119 Type 2 diabetes mellitus without complications: Secondary | ICD-10-CM

## 2015-05-17 LAB — GLUCOSE, CAPILLARY
GLUCOSE-CAPILLARY: 122 mg/dL — AB (ref 65–99)
GLUCOSE-CAPILLARY: 52 mg/dL — AB (ref 65–99)
Glucose-Capillary: 95 mg/dL (ref 65–99)
Glucose-Capillary: 103 mg/dL — ABNORMAL HIGH (ref 65–99)
Glucose-Capillary: 138 mg/dL — ABNORMAL HIGH (ref 65–99)

## 2015-05-17 LAB — URINE CULTURE

## 2015-05-17 NOTE — Progress Notes (Signed)
Occupational Therapy Session Note  Patient Details  Name: Anne Herrera MRN: [id number] Date of Birth: [birth year]  Today's Date: 05/17/2015 OT Individual Time: 9437-0052 OT Individual Time Calculation (min): 32 min    Short Term Goals: Week 1:  OT Short Term Goal 1 (Week 1): Pt will transfer to/from toilet with Mod A OT Short Term Goal 1 - Progress (Week 1): Met OT Short Term Goal 2 (Week 1): Pt will complete toileting with Min A for thoroughness OT Short Term Goal 2 - Progress (Week 1): Met OT Short Term Goal 3 (Week 1): Pt will bathe 10 of 10 body parts with Min A OT Short Term Goal 3 - Progress (Week 1): Met OT Short Term Goal 4 (Week 1): Pt will dress lower body with Mod A using AE OT Short Term Goal 4 - Progress (Week 1): Met Week 2:  OT Short Term Goal 1 (Week 2): STG=LTG d/t short remaining LOS  Skilled Therapeutic Interventions/Progress Updates:  Upon entering the room, pt seated in wheelchair with no c/o pain. NT present for vitals. OT educated and demonstrated finger isolation exercises for R UE with pt returning demonstration x 10 reps each. OT provided yellow, soft theraputty to pt and demonstrated R UE coordination and strengthening exercises. These exercises were written down for pt. Pt returned demonstration of each exercises with min verbal cues for proper technique. Putty and exercise sheet left in room with pt for her to perform when not in therapy sessions in order to continue addressing this need. Call bell and all needed items within reach upon exiting the room.   Therapy Documentation Precautions:  Precautions Precautions: Fall Precaution Comments: Fell at [facility] IR while being assisted to toilet by RN Tech (w/o device) Restrictions Weight Bearing Restrictions: No Vital Signs: Therapy Vitals Temp: 98 F (36.7 C) Temp Source: Oral Pulse Rate: 69 Resp: 18 BP: (!) 141/56 mmHg Patient Position (if appropriate): Sitting Oxygen Therapy SpO2: 98 % O2  Device: Not Delivered Pain:   ADL: ADL ADL Comments: see Functional Assessment  See Function Navigator for Current Functional Status.   Therapy/Group: Individual Therapy  Phineas Semen 05/17/2015, 1:48 PM

## 2015-05-17 NOTE — Progress Notes (Signed)
Patient placed on CPAP of 6. No O2 bleed in needed. Patient tolerating well. Sat 100%. RT will continue to monitor as needed.

## 2015-05-17 NOTE — Progress Notes (Signed)
Physical Therapy Session Note  Patient Details  Name: Anne Herrera MRN: [id number] Date of Birth: 06/01/1936  Today's Date: 05/17/2015 PT Individual Time: 1030-1130 PT Individual Time Calculation (min): 60 min   Short Term Goals: Week 1:  PT Short Term Goal 1 (Week 1): Pt will roll L in bed req min A.  PT Short Term Goal 1 - Progress (Week 1): Met PT Short Term Goal 2 (Week 1): Pt will demonstrate side lie to sit transfer req min A.  PT Short Term Goal 2 - Progress (Week 1): Met PT Short Term Goal 3 (Week 1): Pt will demonstrate sit to stand req mod A, consistently.  PT Short Term Goal 3 - Progress (Week 1): Met PT Short Term Goal 4 (Week 1): Pt will ambulate with one person assist using HW or RW.  PT Short Term Goal 4 - Progress (Week 1): Met PT Short Term Goal 5 (Week 1): Pt will ascend/descend 2 steps with B rails req mod A.  PT Short Term Goal 5 - Progress (Week 1): Met  Skilled Therapeutic Interventions/Progress Updates:  Pt was seen bedside in the am. Pt already had donned R AFO prior to treatment. Pt propelled w/c about 90 feet with B UE and LEs with several rest breaks, S and verbal cues. Pt performed multiple sit to stand transfers and stand pivot transfers with min A and verbal cues for proper technique. Pt ambulated 150 and 40 feet with rolling walker and min A with verbal cues for technique and safety. Treatment focused on NMR utilizing step taps with B LEs, 3 sets x10 reps each. Pt rode Nu-step x 10 minutes at level 2 with one rest break. Pt returned to room. Pt transferred w/c to recliner with rolling walker and min A. Pt left sitting up in recliner with tech at bedside.   Therapy Documentation Precautions:  Precautions Precautions: Fall Precaution Comments: Fell at [facility] IR while being assisted to toilet by RN Tech (w/o device) Restrictions Weight Bearing Restrictions: No General:   Pain: No c/o pain.   See Function Navigator for Current Functional  Status.   Therapy/Group: Individual Therapy  Mitchell, James G 05/17/2015, 11:21 AM  

## 2015-05-17 NOTE — Progress Notes (Signed)
Patient ID: Anne Herrera, female   DOB: 1936-07-12, 79 y.o.   MRN: [id number] Subjective/Complaints:  Discussed stroke recovery Urination without dysuria, similar problems with frq at home  ROS- freq urination, , no pain c/os, no swallowing c/os, no SOB or CP  Objective: Vital Signs: Blood pressure 135/60, pulse 65, temperature 98.3 F (36.8 C), temperature source Oral, resp. rate 18, height  (1.626 m), weight 84.369 kg (186 lb), SpO2 98 %. No results found. Results for orders placed or performed during the hospital encounter of 05/06/15 (from the past 72 hour(s))  Glucose, capillary     Status: Abnormal   Collection Time: 05/14/15 11:10 AM  Result Value Ref Range   Glucose-Capillary 183 (H) 65 - 99 mg/dL  Glucose, capillary     Status: Abnormal   Collection Time: 05/14/15  4:05 PM  Result Value Ref Range   Glucose-Capillary 63 (L) 65 - 99 mg/dL  Glucose, capillary     Status: None   Collection Time: 05/14/15  4:18 PM  Result Value Ref Range   Glucose-Capillary 73 65 - 99 mg/dL  Glucose, capillary     Status: None   Collection Time: 05/14/15 10:02 PM  Result Value Ref Range   Glucose-Capillary 86 65 - 99 mg/dL  Glucose, capillary     Status: Abnormal   Collection Time: 05/15/15 12:08 AM  Result Value Ref Range   Glucose-Capillary 162 (H) 65 - 99 mg/dL  Glucose, capillary     Status: Abnormal   Collection Time: 05/15/15  6:17 AM  Result Value Ref Range   Glucose-Capillary 122 (H) 65 - 99 mg/dL  Glucose, capillary     Status: None   Collection Time: 05/15/15 11:11 AM  Result Value Ref Range   Glucose-Capillary 87 65 - 99 mg/dL  Glucose, capillary     Status: None   Collection Time: 05/15/15  4:11 PM  Result Value Ref Range   Glucose-Capillary 74 65 - 99 mg/dL  Glucose, capillary     Status: None   Collection Time: 05/15/15  9:17 PM  Result Value Ref Range   Glucose-Capillary 92 65 - 99 mg/dL   Comment 1 Notify RN   Glucose, capillary     Status: None   Collection Time: 05/16/15  6:38 AM  Result Value Ref Range   Glucose-Capillary 95 65 - 99 mg/dL  Glucose, capillary     Status: Abnormal   Collection Time: 05/16/15 11:32 AM  Result Value Ref Range   Glucose-Capillary 112 (H) 65 - 99 mg/dL  Urinalysis, Routine w reflex microscopic (not at [facility])     Status: Abnormal   Collection Time: 05/16/15 11:50 AM  Result Value Ref Range   Color, Urine YELLOW YELLOW   APPearance CLEAR CLEAR   Specific Gravity, Urine 1.008 1.005 - 1.030   pH 6.5 5.0 - 8.0   Glucose, UA NEGATIVE NEGATIVE mg/dL   Hgb urine dipstick NEGATIVE NEGATIVE   Bilirubin Urine NEGATIVE NEGATIVE   Ketones, ur NEGATIVE NEGATIVE mg/dL   Protein, ur NEGATIVE NEGATIVE mg/dL   Urobilinogen, UA 0.2 0.0 - 1.0 mg/dL   Nitrite NEGATIVE NEGATIVE   Leukocytes, UA TRACE (A) NEGATIVE  Urine microscopic-add on     Status: None   Collection Time: 05/16/15 11:50 AM  Result Value Ref Range   Squamous Epithelial / LPF RARE RARE   WBC, UA 0-2 <3 WBC/hpf  Glucose, capillary     Status: Abnormal   Collection Time: 05/16/15  4:35 PM  Result  Value Ref Range   Glucose-Capillary 111 (H) 65 - 99 mg/dL  Glucose, capillary     Status: None   Collection Time: 05/16/15  8:57 PM  Result Value Ref Range   Glucose-Capillary 77 65 - 99 mg/dL  Glucose, capillary     Status: Abnormal   Collection Time: 05/17/15  7:03 AM  Result Value Ref Range   Glucose-Capillary 122 (H) 65 - 99 mg/dL     HEENT: normal and no strabismus Cardio: RRR and no murmur Resp: CTA B/L GI: BS positive and NT, ND Extremity:  No Edema Skin:   Other psoriatic patch bilateral pre patellar Neuro: Alert/Oriented, Cranial Nerve Abnormalities right central 7, Abnormal Sensory  R side, Abnormal Motor 3- R delt , bi, tri, grip, HF, KE,2- ADF, Abnormal FMC Ataxic/ dec FMC and Dysarthric Musc/Skel:  Other mild Right elbow flexion contracture Gen NAD   Assessment/Plan: 1. 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 Physiatrist is providing close team supervision and 24 hour management of active medical problems listed below. Physiatrist and rehab team continue to assess barriers to discharge/monitor patient progress toward functional and medical goals.  FIM: Function - Bathing Position: Shower Body parts bathed by patient: Right arm, Left arm, Chest, Abdomen, Front perineal area, Right upper leg, Left upper leg, Right lower leg, Left lower leg Body parts bathed by helper: Back, Buttocks Bathing not applicable: Buttocks, Left lower leg, Right lower leg, Right arm Assist Level: Touching or steadying assistance(Pt > 75%)  Function- Upper Body Dressing/Undressing What is the patient wearing?: Bra, Pull over shirt/dress Bra - Perfomed by patient: Thread/unthread right bra strap, Thread/unthread left bra strap Bra - Perfomed by helper: Hook/unhook bra (pull down sports bra) Pull over shirt/dress - Perfomed by patient: Thread/unthread right sleeve, Thread/unthread left sleeve, Put head through opening, Pull shirt over trunk Pull over shirt/dress - Perfomed by helper: Pull shirt over trunk Assist Level: Touching or steadying assistance(Pt > 75%) Set up : To obtain clothing/put away Function - Lower Body Dressing/Undressing What is the patient wearing?: AFO, Shoes, Socks, Underwear, Pants Position: Wheelchair/chair at sink Underwear - Performed by patient: Thread/unthread right underwear leg, Thread/unthread left underwear leg, Pull underwear up/down Underwear - Performed by helper: Thread/unthread right underwear leg, Thread/unthread left underwear leg Pants- Performed by patient: Thread/unthread right pants leg, Thread/unthread left pants leg, Pull pants up/down, Fasten/unfasten pants Pants- Performed by helper: Thread/unthread right pants leg, Thread/unthread left  pants leg, Pull pants up/down, Fasten/unfasten pants Socks - Performed by patient: Don/doff right sock, Don/doff left sock Socks - Performed by helper: Don/doff right sock, Don/doff left sock Shoes - Performed by patient: Don/doff right shoe Shoes - Performed by helper: Fasten right, Don/doff left shoe, Fasten left AFO - Performed by helper: Don/doff left AFO Assist Level: Touching or steadying assistance (Pt > 75%)  Function - Toileting Toileting steps completed by patient: Adjust clothing prior to toileting, Performs perineal hygiene, Adjust clothing after toileting Toileting steps completed by helper: Adjust clothing prior to toileting, Adjust clothing after toileting Toileting Assistive Devices: Grab bar or rail Assist level: Touching or steadying assistance (Pt.75%)  Function - Toilet Transfers Toilet transfer assistive device: Elevated toilet seat/BSC over toilet, Grab bar Mechanical lift: Stedy Assist level to toilet: Touching or steadying assistance (Pt > 75%) Assist level from toilet: Touching or steadying assistance (Pt > 75%) Assist level to bedside commode (at bedside):  Touching or steadying assistance (Pt > 75%) Assist level from bedside commode (at bedside): Touching or steadying assistance (Pt > 75%)  Function - Chair/bed transfer Chair/bed transfer method: Stand pivot Chair/bed transfer assist level: Moderate assist (Pt 50 - 74%/lift or lower) Chair/bed transfer assistive device: Orthosis (R AFO) Chair/bed transfer details: Manual facilitation for weight shifting, Verbal cues for technique, Verbal cues for sequencing  Function - Locomotion: Wheelchair Will patient use wheelchair at discharge?: Yes (pt fatigues quickly) Type: Manual Max wheelchair distance: 72' Assist Level: Supervision or verbal cues Assist Level: Supervision or verbal cues Wheel 150 feet activity did not occur: Safety/medical concerns (low endurance - unable to complete) Assist Level: Supervision  or verbal cues Turns around,maneuvers to table,bed, and toilet,negotiates 3% grade,maneuvers on rugs and over doorsills: No Function - Locomotion: Ambulation Assistive device: Walker-rolling, Orthosis Max distance: 100 ft Assist level: Touching or steadying assistance (Pt > 75%) Assist level: Touching or steadying assistance (Pt > 75%) Walk 50 feet with 2 turns activity did not occur: N/A Assist level: Touching or steadying assistance (Pt > 75%) Walk 150 feet activity did not occur: Safety/medical concerns (low endurance - unable) Assist level: Touching or steadying assistance (Pt > 75%) Walk 10 feet on uneven surfaces activity did not occur: Safety/medical concerns (R LE too unstable - unsafe)  Function - Comprehension Comprehension: Auditory Comprehension assist level: Follows complex conversation/direction with no assist  Function - Expression Expression: Verbal Expression assist level: Expresses complex ideas: With no assist  Function - Social Interaction Social Interaction assist level: Interacts appropriately with others - No medications needed.  Function - Problem Solving Problem solving assist level: Solves basic problems with no assist  Function - Memory Memory assist level: More than reasonable amount of time Patient normally able to recall (first 3 days only): Current season, Location of own room, Staff names and faces, That he or she is in a hospital  Medical Problem List and Plan: 1. 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 DVT Prophylaxis/Anticoagulation: Pharmaceutical: Lovenox 3. Pain Management: Tylenol prn pain. 4. Mood: LCSW to follow for evaluation and support.  5. Neuropsych: This patient is capable of making decisions on her own behalf. 6.  Skin/Wound Care: Routine pressure relief measures.  7. Fluids/Electrolytes/Nutrition: Monitor I/O.Eats 100% meals . Improved fluid intake 8. HTN: Monitor bid. Continue coreg bid.Back on low dose chlothalidone. BP 135/60 , , No orthostatic changes, restarted cozaar, monitor for hypostension 9. DM type 2: Monitor BS ac/hs.  Continue glipizide and metformin.Freq stools resolved, doubt metformin Use SSI for elevated BS and adjust medications as indicated.  glucose 122 this am,control is good 10. H/o Gout: Controlled off medications. Monitor for joint pain and swelling 11.  Psoriasis, local tx, has triamcinolone cream ordered LOS (Days) 11 A FACE TO FACE EVALUATION WAS PERFORMED  KIRSTEINS,ANDREW E 05/17/2015, 9:14 AM      Claudette Laws E 05/17/2015, 9:15 AM

## 2015-05-17 NOTE — Progress Notes (Signed)
Hypoglycemic Event  CBG: 52  Treatment: snack  Symptoms: none  Follow-up CBG: Time:1200 CBG Result:95  Possible Reasons for Event: unknown  Comments/MD notified:Dr. Allen Norris  Remember to initiate Hypoglycemia Order Set & complete

## 2015-05-18 ENCOUNTER — Encounter (HOSPITAL_COMMUNITY): Payer: Medicare PPO

## 2015-05-18 ENCOUNTER — Inpatient Hospital Stay (HOSPITAL_COMMUNITY): Payer: Medicare PPO

## 2015-05-18 LAB — CREATININE, SERUM
CREATININE: 1.03 mg/dL — AB (ref 0.44–1.00)
GFR calc Af Amer: 60 mL/min — ABNORMAL LOW (ref >60)
GFR calc non Af Amer: 51 mL/min — ABNORMAL LOW (ref >60)

## 2015-05-18 LAB — GLUCOSE, CAPILLARY
GLUCOSE-CAPILLARY: 75 mg/dL (ref 65–99)
Glucose-Capillary: 146 mg/dL — ABNORMAL HIGH (ref 65–99)
Glucose-Capillary: 110 mg/dL — ABNORMAL HIGH (ref 65–99)
Glucose-Capillary: 157 mg/dL — ABNORMAL HIGH (ref 65–99)

## 2015-05-18 MED ORDER — GLIPIZIDE 2.5 MG HALF TABLET
2.5000 mg | ORAL_TABLET | Freq: Every day | ORAL | Status: DC
  Administered 2015-05-19 – 2015-05-22 (×3): 2.5 mg via ORAL
  Filled 2015-05-18 (×5): qty 1

## 2015-05-18 NOTE — Progress Notes (Signed)
Occupational Therapy Session Note  Patient Details  Name: Anne Herrera MRN: [id number] Date of Birth: 03-Jan-1936  Today's Date: 05/18/2015 OT Individual Time: 0845-1000 OT Individual Time Calculation (min): 75 min    Short Term Goals: Week 2:  OT Short Term Goal 1 (Week 2): STG=LTG d/t short remaining LOS  Skilled Therapeutic Interventions/Progress Updates: ADL-retraining with focus on improved safety awareness and setup during BADL.   Pt required 3 attempts to rise from recliner to RW with mild LOB noted during 3rd attempt.   Pt is unable to don AFO independently but benefits from use of device during all mobility.   Pt ambulates to bathroom with min guard assist and transfers with only supervision assist required.  Pt bathes seated, standing supported using grab bar only to allow assist with thoroughness when washing her buttocks.   Pt dresses with extra time seated in her w/c and was able to don both socks using sock aid after setup to apply socks to device.   Pt dons left shoe independntly using shoe button to fasten.   Pt grooms at sink unassisted with good thoroughness although requiring extra time to progress through tasks.   Pt left in w/c at end of session awaiting physical therapist.     Therapy Documentation Precautions:  Precautions Precautions: Fall Precaution Comments: Larey Seat at [facility] IR while being assisted to toilet by RN Tech (w/o device) Restrictions Weight Bearing Restrictions: No  Vital Signs: Therapy Vitals Temp: 98.4 F (36.9 C) Temp Source: Oral Pulse Rate: 65 Resp: 17 BP: (!) 137/59 mmHg Patient Position (if appropriate): Sitting Oxygen Therapy SpO2: 98 % O2 Device: Not Delivered   Pain: Pain Assessment Pain Assessment: No/denies pain  ADL: ADL ADL Comments: see Functional Assessment  See Function Navigator for Current Functional Status.   Therapy/Group: Individual Therapy   Second session: Time:1330-1430 Time Calculation (min):  60  min  Pain Assessment: No/denies pain  Skilled Therapeutic Interventions: Therapeutic activity with focus on [facility] and NMR of RUE.  Pt engaged in varied activities to include close pin, dowels, putty work, to facilitate improved tip and palmar pinch, normalization of movement patterns and inhibition of flexor synergy dominated by scapular elevation during reaching tasks.  Pt sustained attention to task with 1:1 and hand-over-hand guidance to perform each activity correctly.     See FIM for current functional status  Therapy/Group: Individual Therapy  BARTHOLD,FRANK 05/18/2015, 9:57 AM

## 2015-05-18 NOTE — Progress Notes (Signed)
Physical Therapy Session Note  Patient Details  Name: Shakora Nordquist MRN: [id number] Date of Birth: 23-Jan-1936  Today's Date: 05/18/2015 PT Individual Time: 1005-1105 PT Individual Time Calculation (min): 60 min   Short Term Goals: Week 2:  PT Short Term Goal 1 (Week 2): Pt will demonstrate all bed mobility req SBA.  PT Short Term Goal 2 (Week 2): Pt will ambulate >= 130' req min A.  PT Short Term Goal 3 (Week 2): Pt will participate in an AFO evaluation with orthotist.  PT Short Term Goal 4 (Week 2): Pt will demosntrate dynamic stand balance req min A.  PT Short Term Goal 5 (Week 2): Pt will tolerate 5 minutes of standing during an activity for improved activity tolerance.      Skilled Therapeutic Interventions/Progress Updates:   neuromuscular re-education via manual cues, VCs, forced use for activities below  Gait x 100 with min assist, using RW, with cues for upright trunk, forward gaze.   Pt provided with hand- out for self stretching ex in hook lying position for upper/lower trunk dissociation, and instructed to perform QD or BID in bed.     Pt left resting in w/c with all needs within reach in her room. Therapy Documentation Precautions:  Precautions Precautions: Fall Precaution Comments: Fell at [facility] IR while being assisted to toilet by [employer] (w/o device) Restrictions Weight Bearing Restrictions: No Pain: Pain Assessment Pain Assessment: No/denies pain      Other Treatments: Treatments Therapeutic Activity: w/c propulsion using bil UEs; sit>< stand without use of UEs ; sit>< stand without use of UEs biased to R with L foot on compliant disk; "walking" bottom forward/backward focusing on pelvic dissociation; static trunk stretching in hooklying with focus on R rotation of lower trunk with L cervical rotation, and L rotation of lower trunk with R cervical rotation,with 7# on upper knee; standing biased to R working on R hip protraction and knee control Neuromuscular  Facilitation: Right;Upper Extremity;Lower Extremity;Activity to increase grading;Activity to increase sustained activation;Activity to increase timing and sequencing;Activity to increase lateral weight shifting;Activity to increase anterior-posterior weight shifting;Limitations Weight Bearing Technique RUE Weight Bearing Technique: Forearm seated   See Function Navigator for Current Functional Status.   Therapy/Group: Individual Therapy  COOK,CAROLINE 05/18/2015, 12:28 PM

## 2015-05-18 NOTE — Progress Notes (Signed)
Patient ID: Anne Herrera, female   DOB: [birth year], 79 y.o.   MRN: [id number] Subjective/Complaints: 100% meals recorded, hypoglycemic episode 1130am yest   ROS- freq urination, , no pain c/os, no swallowing c/os, no SOB or CP  Objective: Vital Signs: Blood pressure 137/59, pulse 65, temperature 98.4 F (36.9 C), temperature source Oral, resp. rate 17, height 5' 4"  (1.626 m), weight 84.369 kg (186 lb), SpO2 98 %. No results found. Results for orders placed or performed during the hospital encounter of 05/06/15 (from the past 72 hour(s))  Glucose, capillary     Status: None   Collection Time: 05/15/15 11:11 AM  Result Value Ref Range   Glucose-Capillary 87 65 - 99 mg/dL  Glucose, capillary     Status: None   Collection Time: 05/15/15  4:11 PM  Result Value Ref Range   Glucose-Capillary 74 65 - 99 mg/dL  Glucose, capillary     Status: None   Collection Time: 05/15/15  9:17 PM  Result Value Ref Range   Glucose-Capillary 92 65 - 99 mg/dL   Comment 1 Notify RN   Glucose, capillary     Status: None   Collection Time: 05/16/15  6:38 AM  Result Value Ref Range   Glucose-Capillary 95 65 - 99 mg/dL  Glucose, capillary     Status: Abnormal   Collection Time: 05/16/15 11:32 AM  Result Value Ref Range   Glucose-Capillary 112 (H) 65 - 99 mg/dL  Urinalysis, Routine w reflex microscopic (not at [facility])     Status: Abnormal   Collection Time: 05/16/15 11:50 AM  Result Value Ref Range   Color, Urine YELLOW YELLOW   APPearance CLEAR CLEAR   Specific Gravity, Urine 1.008 1.005 - 1.030   pH 6.5 5.0 - 8.0   Glucose, UA NEGATIVE NEGATIVE mg/dL   Hgb urine dipstick NEGATIVE NEGATIVE   Bilirubin Urine NEGATIVE NEGATIVE   Ketones, ur NEGATIVE NEGATIVE mg/dL   Protein, ur NEGATIVE NEGATIVE mg/dL   Urobilinogen, UA 0.2 0.0 - 1.0 mg/dL   Nitrite NEGATIVE NEGATIVE   Leukocytes, UA TRACE (A) NEGATIVE  Urine culture     Status: None   Collection Time: 05/16/15 11:50 AM  Result Value Ref Range    Specimen Description URINE, RANDOM    Special Requests NONE    Culture MULTIPLE SPECIES PRESENT, SUGGEST RECOLLECTION    Report Status 05/17/2015 FINAL   Urine microscopic-add on     Status: None   Collection Time: 05/16/15 11:50 AM  Result Value Ref Range   Squamous Epithelial / LPF RARE RARE   WBC, UA 0-2 <3 WBC/hpf  Glucose, capillary     Status: Abnormal   Collection Time: 05/16/15  4:35 PM  Result Value Ref Range   Glucose-Capillary 111 (H) 65 - 99 mg/dL  Glucose, capillary     Status: None   Collection Time: 05/16/15  8:57 PM  Result Value Ref Range   Glucose-Capillary 77 65 - 99 mg/dL  Glucose, capillary     Status: Abnormal   Collection Time: 05/17/15  7:03 AM  Result Value Ref Range   Glucose-Capillary 122 (H) 65 - 99 mg/dL  Glucose, capillary     Status: Abnormal   Collection Time: 05/17/15 11:30 AM  Result Value Ref Range   Glucose-Capillary 52 (L) 65 - 99 mg/dL  Glucose, capillary     Status: None   Collection Time: 05/17/15 11:58 AM  Result Value Ref Range   Glucose-Capillary 95 65 - 99 mg/dL  Glucose, capillary  Status: Abnormal   Collection Time: 05/17/15  4:35 PM  Result Value Ref Range   Glucose-Capillary 103 (H) 65 - 99 mg/dL  Glucose, capillary     Status: Abnormal   Collection Time: 05/17/15  8:56 PM  Result Value Ref Range   Glucose-Capillary 138 (H) 65 - 99 mg/dL   Comment 1 Notify RN   Creatinine, serum     Status: Abnormal   Collection Time: 05/18/15  5:51 AM  Result Value Ref Range   Creatinine, Ser 1.03 (H) 0.44 - 1.00 mg/dL   GFR calc non Af Amer 51 (L) >60 mL/min   GFR calc Af Amer 60 (L) >60 mL/min    Comment: (NOTE) The eGFR has been calculated using the CKD EPI equation. This calculation has not been validated in all clinical situations. eGFR's persistently <60 mL/min signify possible Chronic Kidney Disease.   Glucose, capillary     Status: Abnormal   Collection Time: 05/18/15  6:39 AM  Result Value Ref Range    Glucose-Capillary 146 (H) 65 - 99 mg/dL     HEENT: normal and no strabismus Cardio: RRR and no murmur Resp: CTA B/L GI: BS positive and NT, ND Extremity:  No Edema Skin:   Other psoriatic patch bilateral pre patellar Neuro: Alert/Oriented, Cranial Nerve Abnormalities right central 7, Abnormal Sensory  R side, Abnormal Motor 3- R delt , bi, tri, grip, HF, KE,2- ADF, Abnormal FMC Ataxic/ dec FMC and Dysarthric Musc/Skel:  Other mild Right elbow flexion contracture Gen NAD   Assessment/Plan: 1. 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 Physiatrist is providing close team supervision and 24 hour management of active medical problems listed below. Physiatrist and rehab team continue to assess barriers to discharge/monitor patient progress toward functional and medical goals.  FIM: Function - Bathing Position: Shower Body parts bathed by patient: Right arm, Left arm, Chest, Abdomen, Front perineal area, Right upper leg, Left upper leg, Right lower leg, Left lower leg Body parts bathed by helper: Back, Buttocks Bathing not applicable: Buttocks, Left lower leg, Right lower leg, Right arm Assist Level: Touching or steadying assistance(Pt > 75%)  Function- Upper Body Dressing/Undressing What is the patient wearing?: Bra, Pull over shirt/dress Bra - Perfomed by patient: Thread/unthread right bra strap, Thread/unthread left bra strap Bra - Perfomed by helper: Hook/unhook bra (pull down sports bra) Pull over shirt/dress - Perfomed by patient: Thread/unthread right sleeve, Thread/unthread left sleeve, Put head through opening, Pull shirt over trunk Pull over shirt/dress - Perfomed by helper: Pull shirt over trunk Assist Level: Touching or steadying assistance(Pt > 75%) Set up : To obtain clothing/put away Function - Lower Body  Dressing/Undressing What is the patient wearing?: AFO, Shoes, Socks, Underwear, Pants Position: Wheelchair/chair at sink Underwear - Performed by patient: Thread/unthread right underwear leg, Thread/unthread left underwear leg, Pull underwear up/down Underwear - Performed by helper: Thread/unthread right underwear leg, Thread/unthread left underwear leg Pants- Performed by patient: Thread/unthread right pants leg, Thread/unthread left pants leg, Pull pants up/down, Fasten/unfasten pants Pants- Performed by helper: Thread/unthread right pants leg, Thread/unthread left pants leg, Pull pants up/down, Fasten/unfasten pants Socks - Performed by patient: Don/doff right sock, Don/doff left sock Socks - Performed by helper: Don/doff right sock, Don/doff left sock Shoes - Performed by patient: Don/doff right shoe Shoes - Performed by helper: Fasten right, Don/doff left shoe, Fasten left AFO - Performed by helper: Don/doff  left AFO Assist Level: Touching or steadying assistance (Pt > 75%)  Function - Toileting Toileting steps completed by patient: Adjust clothing prior to toileting, Performs perineal hygiene, Adjust clothing after toileting Toileting steps completed by helper: Adjust clothing prior to toileting, Adjust clothing after toileting Toileting Assistive Devices: Grab bar or rail Assist level: Touching or steadying assistance (Pt.75%)  Function - Toilet Transfers Toilet transfer assistive device: Elevated toilet seat/BSC over toilet, Grab bar Mechanical lift: Stedy Assist level to toilet: Touching or steadying assistance (Pt > 75%) Assist level from toilet: Touching or steadying assistance (Pt > 75%) Assist level to bedside commode (at bedside): Touching or steadying assistance (Pt > 75%) Assist level from bedside commode (at bedside): Touching or steadying assistance (Pt > 75%)  Function - Chair/bed transfer Chair/bed transfer method: Stand pivot Chair/bed transfer assist level:  Touching or steadying assistance (Pt > 75%) Chair/bed transfer assistive device: Orthosis, Walker, Armrests Chair/bed transfer details: Manual facilitation for weight shifting, Verbal cues for technique, Verbal cues for sequencing  Function - Locomotion: Wheelchair Will patient use wheelchair at discharge?: Yes Type: Manual Max wheelchair distance: 90 Assist Level: Supervision or verbal cues Assist Level: Supervision or verbal cues Wheel 150 feet activity did not occur: Safety/medical concerns (low endurance - unable to complete) Assist Level: Supervision or verbal cues Turns around,maneuvers to table,bed, and toilet,negotiates 3% grade,maneuvers on rugs and over doorsills: No Function - Locomotion: Ambulation Assistive device: Walker-rolling, Orthosis Max distance: 150 Assist level: Touching or steadying assistance (Pt > 75%) Assist level: Touching or steadying assistance (Pt > 75%) Walk 50 feet with 2 turns activity did not occur: N/A Assist level: Touching or steadying assistance (Pt > 75%) Walk 150 feet activity did not occur: Safety/medical concerns (low endurance - unable) Assist level: Touching or steadying assistance (Pt > 75%) Walk 10 feet on uneven surfaces activity did not occur: Safety/medical concerns (R LE too unstable - unsafe)  Function - Comprehension Comprehension: Auditory Comprehension assist level: Follows basic conversation/direction with no assist  Function - Expression Expression: Verbal Expression assist level: Expresses basic needs/ideas: With extra time/assistive device  Function - Social Interaction Social Interaction assist level: Interacts appropriately with others with medication or extra time (anti-anxiety, antidepressant).  Function - Problem Solving Problem solving assist level: Solves basic problems with no assist  Function - Memory Memory assist level: Requires cues to use assistive device Patient normally able to recall (first 3 days only):  Current season, Location of own room, Staff names and faces, That he or she is in a hospital  Medical Problem List and Plan: 1. 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 DVT Prophylaxis/Anticoagulation: Pharmaceutical: Lovenox 3. Pain Management: Tylenol prn pain. 4. Mood: LCSW to follow for evaluation and support.  5. Neuropsych: This patient is capable of making decisions on her own behalf. 6. Skin/Wound Care: Routine pressure relief measures.  7. Fluids/Electrolytes/Nutrition: Monitor I/O.Eats 100% meals . Improved fluid intake 8. HTN: Monitor bid. Continue coreg bid.Back on low dose chlothalidone. BP 137/59 , , No orthostatic changes, restarted cozaar, monitor for hypostension 9. DM type 2: Monitor BS ac/hs.  Continue glipizide and metformin.  glucose 146 this am,hypoglycemia at ~11a yesterday, lower dose 10. H/o Gout: Controlled off medications. Monitor for joint pain and swelling 11.  Psoriasis, local tx, has triamcinolone cream ordered LOS (Days) 12 A FACE TO FACE EVALUATION WAS  PERFORMED  Charlett Blake 05/18/2015, 8:05 AM      Charlett Blake 05/18/2015, 8:05 AM

## 2015-05-19 ENCOUNTER — Inpatient Hospital Stay (HOSPITAL_COMMUNITY): Payer: Medicare PPO | Admitting: Physical Therapy

## 2015-05-19 ENCOUNTER — Inpatient Hospital Stay (HOSPITAL_COMMUNITY): Payer: Medicare PPO

## 2015-05-19 LAB — GLUCOSE, CAPILLARY
GLUCOSE-CAPILLARY: 92 mg/dL (ref 65–99)
GLUCOSE-CAPILLARY: 71 mg/dL (ref 65–99)
GLUCOSE-CAPILLARY: 109 mg/dL — AB (ref 65–99)
Glucose-Capillary: 118 mg/dL — ABNORMAL HIGH (ref 65–99)

## 2015-05-19 NOTE — Progress Notes (Signed)
Physical Therapy Session Note  Patient Details  Name: Anne Herrera MRN: [id number] Date of Birth: 06/02/1936  Today's Date: 05/19/2015 PT Individual Time: 0900-1000 PT Individual Time Calculation (min): 60 min   Short Term Goals: Week 2:  PT Short Term Goal 1 (Week 2): Pt will demonstrate all bed mobility req SBA.  PT Short Term Goal 2 (Week 2): Pt will ambulate >= 130' req min A.  PT Short Term Goal 3 (Week 2): Pt will participate in an AFO evaluation with orthotist.  PT Short Term Goal 4 (Week 2): Pt will demosntrate dynamic stand balance req min A.  PT Short Term Goal 5 (Week 2): Pt will tolerate 5 minutes of standing during an activity for improved activity tolerance.   Skilled Therapeutic Interventions/Progress Updates:    Pt received seated in recliner; no c/o pain and agreeable to treatment. Pt assisted with donning socks/shoes/orthosis with modA for time management. W/c propulsion in room, hallway and simulated apartment with min cues for turning, maneuvering through doorways. Furniture transfer with mod cues for set-up of w/c. Sit >stand with feet staggered and pt performing stand very quickly, mod LOB requiring modA to maintain balance. Stand pivot to couch with CGA overall. Sit >stand from low couch with improved foot alignment and CGA, no LOB. Gait x150' with minA for facilitation at R glutes during stance, and pelvic rotation during R swing phase. Standing balance with LLE supported on 2" step to facilitate weight shift and weight bearing on RLE. While standing, played card game with pt cued to use RUE to improve [facility]. Nustep x8 min on level 4 with BLE. Pt returned to room and remained seated in w/c with handoff to OT for next session.   Therapy Documentation Precautions:  Precautions Precautions: Fall Precaution Comments: Fell at [facility] while being assisted to toilet by RN Tech (w/o device) Restrictions Weight Bearing Restrictions: No Pain: Pain Assessment Pain  Assessment: No/denies pain Pain Score: 0-No pain   See Function Navigator for Current Functional Status.   Therapy/Group: Individual Therapy  Vista Lawman 05/19/2015, 9:53 AM

## 2015-05-19 NOTE — Progress Notes (Signed)
Patient ID: Anne Herrera, female   DOB: 09-Aug-1936, 79 y.o.   MRN: [id number] Subjective/Complaints: Pt wants to talk to SW about SNF   ROS- freq urination, , no pain c/os, no swallowing c/os, no SOB or CP  Objective: Vital Signs: Blood pressure 152/70, pulse 73, temperature 98.1 F (36.7 C), temperature source Oral, resp. rate 18, height 5' 4"  (1.626 m), weight 84.369 kg (186 lb), SpO2 99 %. No results found. Results for orders placed or performed during the hospital encounter of 05/06/15 (from the past 72 hour(s))  Glucose, capillary     Status: Abnormal   Collection Time: 05/16/15 11:32 AM  Result Value Ref Range   Glucose-Capillary 112 (H) 65 - 99 mg/dL  Urinalysis, Routine w reflex microscopic (not at [facility])     Status: Abnormal   Collection Time: 05/16/15 11:50 AM  Result Value Ref Range   Color, Urine YELLOW YELLOW   APPearance CLEAR CLEAR   Specific Gravity, Urine 1.008 1.005 - 1.030   pH 6.5 5.0 - 8.0   Glucose, UA NEGATIVE NEGATIVE mg/dL   Hgb urine dipstick NEGATIVE NEGATIVE   Bilirubin Urine NEGATIVE NEGATIVE   Ketones, ur NEGATIVE NEGATIVE mg/dL   Protein, ur NEGATIVE NEGATIVE mg/dL   Urobilinogen, UA 0.2 0.0 - 1.0 mg/dL   Nitrite NEGATIVE NEGATIVE   Leukocytes, UA TRACE (A) NEGATIVE  Urine culture     Status: None   Collection Time: 05/16/15 11:50 AM  Result Value Ref Range   Specimen Description URINE, RANDOM    Special Requests NONE    Culture MULTIPLE SPECIES PRESENT, SUGGEST RECOLLECTION    Report Status 05/17/2015 FINAL   Urine microscopic-add on     Status: None   Collection Time: 05/16/15 11:50 AM  Result Value Ref Range   Squamous Epithelial / LPF RARE RARE   WBC, UA 0-2 <3 WBC/hpf  Glucose, capillary     Status: Abnormal   Collection Time: 05/16/15  4:35 PM  Result Value Ref Range   Glucose-Capillary 111 (H) 65 - 99 mg/dL  Glucose, capillary     Status: None   Collection Time: 05/16/15  8:57 PM  Result Value Ref Range   Glucose-Capillary  77 65 - 99 mg/dL  Glucose, capillary     Status: Abnormal   Collection Time: 05/17/15  7:03 AM  Result Value Ref Range   Glucose-Capillary 122 (H) 65 - 99 mg/dL  Glucose, capillary     Status: Abnormal   Collection Time: 05/17/15 11:30 AM  Result Value Ref Range   Glucose-Capillary 52 (L) 65 - 99 mg/dL  Glucose, capillary     Status: None   Collection Time: 05/17/15 11:58 AM  Result Value Ref Range   Glucose-Capillary 95 65 - 99 mg/dL  Glucose, capillary     Status: Abnormal   Collection Time: 05/17/15  4:35 PM  Result Value Ref Range   Glucose-Capillary 103 (H) 65 - 99 mg/dL  Glucose, capillary     Status: Abnormal   Collection Time: 05/17/15  8:56 PM  Result Value Ref Range   Glucose-Capillary 138 (H) 65 - 99 mg/dL   Comment 1 Notify RN   Creatinine, serum     Status: Abnormal   Collection Time: 05/18/15  5:51 AM  Result Value Ref Range   Creatinine, Ser 1.03 (H) 0.44 - 1.00 mg/dL   GFR calc non Af Amer 51 (L) >60 mL/min   GFR calc Af Amer 60 (L) >60 mL/min    Comment: (NOTE) The  eGFR has been calculated using the CKD EPI equation. This calculation has not been validated in all clinical situations. eGFR's persistently <60 mL/min signify possible Chronic Kidney Disease.   Glucose, capillary     Status: Abnormal   Collection Time: 05/18/15  6:39 AM  Result Value Ref Range   Glucose-Capillary 146 (H) 65 - 99 mg/dL  Glucose, capillary     Status: None   Collection Time: 05/18/15 11:33 AM  Result Value Ref Range   Glucose-Capillary 75 65 - 99 mg/dL  Glucose, capillary     Status: Abnormal   Collection Time: 05/18/15  4:29 PM  Result Value Ref Range   Glucose-Capillary 110 (H) 65 - 99 mg/dL  Glucose, capillary     Status: Abnormal   Collection Time: 05/18/15  8:39 PM  Result Value Ref Range   Glucose-Capillary 157 (H) 65 - 99 mg/dL  Glucose, capillary     Status: Abnormal   Collection Time: 05/19/15  5:58 AM  Result Value Ref Range   Glucose-Capillary 118 (H) 65 - 99  mg/dL   Comment 1 Notify RN      HEENT: normal and no strabismus Cardio: RRR and no murmur Resp: CTA B/L GI: BS positive and NT, ND Extremity:  No Edema Skin:   Other psoriatic patch bilateral pre patellar Neuro: Alert/Oriented, Cranial Nerve Abnormalities right central 7, Abnormal Sensory  R side, Abnormal Motor 3- R delt , bi, tri, grip,4- HF, KE,3- ADF, Abnormal FMC Ataxic/ dec FMC and Dysarthric Musc/Skel:  Other mild Right elbow flexion contracture Gen NAD   Assessment/Plan: 1. 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 Physiatrist is providing close team supervision and 24 hour management of active medical problems listed below. Physiatrist and rehab team continue to assess barriers to discharge/monitor patient progress toward functional and medical goals.  FIM: Function - Bathing Position: Shower Body parts bathed by patient: Right arm, Left arm, Chest, Abdomen, Front perineal area, Right upper leg, Left upper leg, Right lower leg, Left lower leg Body parts bathed by helper: Back, Buttocks Bathing not applicable: Buttocks, Left lower leg, Right lower leg, Right arm Assist Level: Touching or steadying assistance(Pt > 75%)  Function- Upper Body Dressing/Undressing What is the patient wearing?: Bra, Pull over shirt/dress Bra - Perfomed by patient: Thread/unthread right bra strap, Thread/unthread left bra strap Bra - Perfomed by helper: Hook/unhook bra (pull down sports bra) Pull over shirt/dress - Perfomed by patient: Thread/unthread right sleeve, Thread/unthread left sleeve, Put head through opening, Pull shirt over trunk Pull over shirt/dress - Perfomed by helper: Pull shirt over trunk Assist Level: Touching or steadying assistance(Pt > 75%) Set up : To obtain clothing/put away Function - Lower Body  Dressing/Undressing What is the patient wearing?: Underwear, Pants, Socks, Shoes, AFO Position: Wheelchair/chair at sink Underwear - Performed by patient: Thread/unthread right underwear leg, Thread/unthread left underwear leg Underwear - Performed by helper: Pull underwear up/down Pants- Performed by patient: Thread/unthread right pants leg, Thread/unthread left pants leg Pants- Performed by helper: Pull pants up/down Socks - Performed by patient: Don/doff right sock, Don/doff left sock Socks - Performed by helper: Don/doff right sock, Don/doff left sock Shoes - Performed by patient: Don/doff left shoe, Fasten left Shoes - Performed by helper: Don/doff right shoe, Fasten right AFO - Performed by helper: Don/doff right AFO Assist Level: Touching or steadying assistance (Pt > 75%)  Function - Toileting  Toileting steps completed by patient: Adjust clothing prior to toileting, Performs perineal hygiene, Adjust clothing after toileting Toileting steps completed by helper: Adjust clothing prior to toileting, Adjust clothing after toileting Toileting Assistive Devices: Grab bar or rail Assist level: Touching or steadying assistance (Pt.75%)  Function - Toilet Transfers Toilet transfer assistive device: Elevated toilet seat/BSC over toilet, Grab bar Mechanical lift: Stedy Assist level to toilet: Touching or steadying assistance (Pt > 75%) Assist level from toilet: Touching or steadying assistance (Pt > 75%) Assist level to bedside commode (at bedside): Touching or steadying assistance (Pt > 75%) Assist level from bedside commode (at bedside): Touching or steadying assistance (Pt > 75%)  Function - Chair/bed transfer Chair/bed transfer method: Stand pivot Chair/bed transfer assist level: Touching or steadying assistance (Pt > 75%) Chair/bed transfer assistive device: Orthosis (R AFO) Chair/bed transfer details: Manual facilitation for weight shifting, Verbal cues for technique, Verbal cues  for sequencing  Function - Locomotion: Wheelchair Will patient use wheelchair at discharge?: Yes Type: Manual Max wheelchair distance: 100 Assist Level: Supervision or verbal cues Assist Level: Supervision or verbal cues Wheel 150 feet activity did not occur: Safety/medical concerns (low endurance - unable to complete) Assist Level: Supervision or verbal cues Turns around,maneuvers to table,bed, and toilet,negotiates 3% grade,maneuvers on rugs and over doorsills: No Function - Locomotion: Ambulation Assistive device: Walker-rolling, Orthosis Max distance: 150 Assist level: Touching or steadying assistance (Pt > 75%) Assist level: Touching or steadying assistance (Pt > 75%) Walk 50 feet with 2 turns activity did not occur: N/A Assist level: Touching or steadying assistance (Pt > 75%) Walk 150 feet activity did not occur: Safety/medical concerns (low endurance - unable) Assist level: Touching or steadying assistance (Pt > 75%) Walk 10 feet on uneven surfaces activity did not occur: Safety/medical concerns (R LE too unstable - unsafe)  Function - Comprehension Comprehension: Auditory Comprehension assist level: Follows complex conversation/direction with no assist  Function - Expression Expression: Verbal Expression assist level: Expresses basic needs/ideas: With extra time/assistive device  Function - Social Interaction Social Interaction assist level: Interacts appropriately with others with medication or extra time (anti-anxiety, antidepressant).  Function - Problem Solving Problem solving assist level: Solves basic 90% of the time/requires cueing < 10% of the time  Function - Memory Memory assist level: More than reasonable amount of time Patient normally able to recall (first 3 days only): Current season, Location of own room, Staff names and faces, That he or she is in a hospital  Medical Problem List and Plan: 1. Functional deficits secondary to Left putamen/Corona  radiata infarct with RIght hemiparesis ,  Working on mobility, donning/doffing AFO -ASA for stroke prophylaxis -removable event monitor in place, f/u cardiology as outpt 2. DVT Prophylaxis/Anticoagulation: Pharmaceutical: Lovenox 3. Pain Management: Tylenol prn pain. 4. Mood: LCSW to follow for evaluation and support.  5. Neuropsych: This patient is capable of making decisions on her own behalf. 6. Skin/Wound Care: Routine pressure relief measures.  7. Fluids/Electrolytes/Nutrition: Monitor I/O.Eats 100% meals . Improved fluid intake 8. HTN: Monitor bid. Continue coreg bid.Back on low dose chlothalidone. BP 152/70 , , No orthostatic changes, restarted cozaar, monitor for hypotension, currently on lower dose than at home 9. DM type 2: Monitor BS ac/hs.  Continue glipizide and metformin.  glucose 118 this am,75 at ~11a yesterday, lower dose 10. H/o Gout: Controlled off medications. Monitor for joint pain and swelling 11.  Psoriasis, local tx, has triamcinolone cream ordered LOS (Days) 13 A FACE TO FACE EVALUATION WAS PERFORMED  Charlett Blake 05/19/2015, 7:52 AM      Charlett Blake 05/19/2015, 7:52 AM

## 2015-05-19 NOTE — Progress Notes (Signed)
Pt.placed on cpap. Tolerating well at this time.

## 2015-05-19 NOTE — Progress Notes (Signed)
Recreational Therapy Session Note  Patient Details  Name: Anne Herrera MRN: [id number] Date of Birth: April 22, 1936 Today's Date: 05/19/2015  Pain: no c/o Skilled Therapeutic Interventions/Progress Updates: Session focused on purpose of community reintegration and the potential for an outing to be scheduled tomorrow to practice functional mobility in community setting.  Pt agreeable to participate.  Riniyah Speich 05/19/2015, 4:21 PM

## 2015-05-19 NOTE — Progress Notes (Signed)
Physical Therapy Session Note  Patient Details  Name: Anne Herrera MRN: [id number] Date of Birth: Jan 13, 1936  Today's Date: 05/19/2015 PT Individual Time: 1400-1455 PT Individual Time Calculation (min): 55 min   Short Term Goals: Week 2:  PT Short Term Goal 1 (Week 2): Pt will demonstrate all bed mobility req SBA.  PT Short Term Goal 2 (Week 2): Pt will ambulate >= 130' req min A.  PT Short Term Goal 3 (Week 2): Pt will participate in an AFO evaluation with orthotist.  PT Short Term Goal 4 (Week 2): Pt will demosntrate dynamic stand balance req min A.  PT Short Term Goal 5 (Week 2): Pt will tolerate 5 minutes of standing during an activity for improved activity tolerance.   Skilled Therapeutic Interventions/Progress Updates:   Pt received resting in the recliner.  Pt with questions about specific D/C date and bed availability; will have SW f/u with patient.  Pt performed gait x 150' in controlled environment with min A, RW and orthosis with continued focus on increased trunk control, decreased L lateral lean to L to clear RLE and increased activation of R swing from hip/pelvis and full lateral weight shift to RLE to increased L step length.  In gym performed BERG balance assessment; see below for details.  Discussed falls risk with pt and areas of balance to continue to address (including weight shifting and single limb stance).  Performed multiple stand pivot transfers mat > w/c > recliner without UE support on RW but with min A and focus on full lateral weight shifting, advancement and placement of RLE, sequencing and trunk control.  Pt left in recliner with all items within reach.    Therapy Documentation Precautions:  Precautions Precautions: Fall Precaution Comments: Fell at [facility] IR while being assisted to toilet by RN Tech (w/o device) Restrictions Weight Bearing Restrictions: No Vital Signs: Therapy Vitals Temp: 98.2 F (36.8 C) Temp Source: Oral Pulse Rate: 65 Resp:  18 BP: (!) 156/66 mmHg Patient Position (if appropriate): Sitting Oxygen Therapy SpO2: 100 % O2 Device: Not Delivered Pain: Pain Assessment Pain Assessment: No/denies pain Balance: Standardized Balance Assessment Standardized Balance Assessment: Berg Balance Test Berg Balance Test Sit to Stand: Needs minimal aid to stand or to stabilize Standing Unsupported: Able to stand 2 minutes with supervision Sitting with Back Unsupported but Feet Supported on Floor or Stool: Able to sit safely and securely 2 minutes Stand to Sit: Controls descent by using hands Transfers: Needs one person to assist Standing Unsupported with Eyes Closed: Able to stand 10 seconds with supervision Standing Ubsupported with Feet Together: Needs help to attain position but able to stand for 30 seconds with feet together From Standing, Reach Forward with Outstretched Arm: Can reach forward >12 cm safely (5") From Standing Position, Pick up Object from Floor: Able to pick up shoe, needs supervision From Standing Position, Turn to Look Behind Over each Shoulder: Turn sideways only but maintains balance Turn 360 Degrees: Needs assistance while turning Standing Unsupported, Alternately Place Feet on Step/Stool: Needs assistance to keep from falling or unable to try Standing Unsupported, One Foot in Front: Needs help to step but can hold 15 seconds Standing on One Leg: Unable to try or needs assist to prevent fall Total Score: 25  Patient demonstrates increased fall risk as noted by score of 25/56 on Berg Balance Scale.  (<36= high risk for falls, close to 100%; 37-45 significant >80%; 46-51 moderate >50%; 52-55 lower >25%)   See Function Navigator for  Current Functional Status.   Therapy/Group: Individual Therapy  Edman Circle [facility] 05/19/2015, 5:02 PM

## 2015-05-19 NOTE — Consult Note (Signed)
NEUROBEHAVIORAL STATUS EXAM - [facility]   MEDICAL NECESSITY:  Anne Herrera was seen on the [facility] Unit for a neurobehavioral status exam owing to the patient's diagnosis of cerebral infarction, and to assist in treatment planning during admission.   According to medical records, Anne Herrera was admitted to the rehab unit owing to "Functional deficits secondary to left putamen/corona radiata infarct with extension."  Records also indicate that she is a "80 y.o. left handed female with history of HTN, DM type 2, OSA, left lacunar infarct 04/29/15 who was discharged to home on 9/01 but readmitted to [facility] on 09/02 with slurred speech, inability to use RUE as well as RLE weakness with difficulty walking.  MRI brain done revealing slightly enlarged left posterior putamen and left posterior CR infarct. TEE without evidence of thrombus and 30 day event monitor was placed."   During today's visit, Anne Herrera reported suffering from what she believes to be age-related memory loss that was of little concern to her. No new cognitive symptoms or concerns post-stroke, though she mentioned that her speech was initially affected but this has resolved. Now her major complaint is right-side motor issues.   From an emotional standpoint, Anne Herrera reported experiencing mild depressive symptoms initially post-stroke but she has "accepted" what has occurred and she is coping well with her present medical situation. The biggest reason she was so upset in the beginning is that she lives a very active lifestyle and this prevented her from participating in the activities she enjoys.  She admitted to previously being under a degree of stress in her daily life given certain responsibilities (e.g., volunteer and organizational participation). She plans on changing her life to reduce the amount of stress these activities cause. She had started getting massages to help  with stress reduction.    No adjustment issues endorsed. Suicidal/homicidal ideation, plan or intent was denied. No manic or hypomanic episodes were reported. The patient denied ever experiencing any auditory/visual hallucinations. No major behavioral or personality changes were endorsed.   Patient feels that progress is being made in therapy. No barriers to therapy identified. She described the rehab staff as "real good." Since she lives alone, she requested to go to a SNF upon discharge. She is okay with this situation. She has her children to support her throughout this endeavor, particularly her son who lives in [place]. One request she does have is to get out of her room more often. I told her that this could be discussed with OT.   PROCEDURES: [2 units 96116] Diagnostic clinical interview  Review of available records Mental Status Exam-2 (brief version)  MENTAL STATUS: Anne Herrera' mental status exam score of 15/16 is above the cutoff used to indicate blatant cognitive impairment or frank dementia. Recall was 2/3.    IMPRESSION: Anne Herrera denied suffering from any major cognitive difficulties post-stroke. Simple abbreviated mental status exam was intact. While mild depressive symptoms were present initially, they have subsided and she is adjusting well to her situation. She also has an appropriate level of social support. With regard to going to a SNF upon discharge, the patient requested this and is okay with the situation.   Overall, Anne Herrera is adjusting well to her situation and I see no need for further cognitive evaluation or follow-up for coping unless requested by the patient or staff. I do plan on informally checking on the patient throughout her stay.     Debbe Mounts, Psy.D.  Clinical Neuropsychologist

## 2015-05-19 NOTE — Progress Notes (Signed)
Occupational Therapy Session Note  Patient Details  Name: Anne Herrera MRN: [id number] Date of Birth: 09/24/1935  Today's Date: 05/19/2015 OT Individual Time: 1000-1130 OT Individual Time Calculation (min): 90 min    Short Term Goals: Week 2:  OT Short Term Goal 1 (Week 2): STG=LTG d/t short remaining LOS  Skilled Therapeutic Interventions/Progress Updates: ADL:-retraining with focus on improved RUE function during BADL, adapted dressing skills, AE training.   Pt requires steadying assist to rise from recliner to RW and then ambulates to bathroom with close supervision.   Pt transfers and bathes seated on tub bench, standing to allow therapist to assist with thoroughness when washing her buttocks and back.    Pt also requests assist with thoroughness when drying herself after bath.   OT re-educated pt on principle of effective setup prior to bathing to improve independence with task however pt reiterates plan to acquire assistance with self-care for thoroughness as preferred over a conflicting desire to return to being fully independent at home again with BADL and iADL.  OT introduced use of stool to improve access to feet and to aid in donning AFO.   With min assist pt was able to don right AFO and fasten shoe buttons on both feet.   Pt groomed seated at sink and returned to w/c for continued treatment activity to enhance right hand function (30 min).   OT instructed pt on strategies to inhibit RUE flexor synergies while performing functional task: flipping cards over.   Pt demo's horizontal abduction of right shoulder, elbow flexion and forearm pronation during task which was restrained by therapist with facilitation provided to promote isolated shoulder adduction, scapular depression, forearm supination, palmar pinch and wrist extension during task.   OT provided instruction on use of elastic band to focus on finger extension with emphasis on isolation of index finger and thumb to improved  functional pinch.   Pt left in w/c at end of session with all needs within reach.         Therapy Documentation Precautions:  Precautions Precautions: Fall Precaution Comments: Fell at [facility] IR while being assisted to toilet by RN Tech (w/o device) Restrictions Weight Bearing Restrictions: No  Vital Signs: Therapy Vitals Temp: 98.2 F (36.8 C) Temp Source: Oral Pulse Rate: 65 Resp: 18 BP: (!) 156/66 mmHg Patient Position (if appropriate): Sitting Oxygen Therapy SpO2: 100 % O2 Device: Not Delivered   Pain: No/denies pain   ADL: ADL ADL Comments: see Functional Assessment  See Function Navigator for Current Functional Status.   Therapy/Group: Individual Therapy  BARTHOLD,FRANK 05/19/2015, 2:33 PM

## 2015-05-19 NOTE — Plan of Care (Signed)
Problem: RH Car Transfers Goal: LTG Patient will perform car transfers with assist (PT) LTG: Patient will perform car transfers with assistance (PT).  Outcome: Not Applicable Date Met:  00/29/84 D/C due to SNF placement  Problem: RH Ambulation Goal: LTG Patient will ambulate in home environment (PT) LTG: Patient will ambulate in home environment, # of feet with assistance (PT).  Outcome: Not Applicable Date Met:  73/08/56 D/C due to SNF placement     Problem: RH Wheelchair Mobility Goal: LTG Patient will propel w/c in community environment (PT) LTG: Patient will propel wheelchair in community environment, # of feet with assist (PT)  Outcome: Not Applicable Date Met:  94/37/00 D/C due to SNF placement

## 2015-05-20 ENCOUNTER — Encounter (HOSPITAL_COMMUNITY): Payer: Medicare PPO | Admitting: *Deleted

## 2015-05-20 ENCOUNTER — Inpatient Hospital Stay (HOSPITAL_COMMUNITY): Payer: Medicare PPO | Admitting: Physical Therapy

## 2015-05-20 ENCOUNTER — Inpatient Hospital Stay (HOSPITAL_COMMUNITY): Payer: Medicare PPO

## 2015-05-20 DIAGNOSIS — N319 Neuromuscular dysfunction of bladder, unspecified: Secondary | ICD-10-CM

## 2015-05-20 DIAGNOSIS — E119 Type 2 diabetes mellitus without complications: Secondary | ICD-10-CM

## 2015-05-20 DIAGNOSIS — I10 Essential (primary) hypertension: Secondary | ICD-10-CM | POA: Insufficient documentation

## 2015-05-20 LAB — GLUCOSE, CAPILLARY
GLUCOSE-CAPILLARY: 113 mg/dL — AB (ref 65–99)
Glucose-Capillary: 112 mg/dL — ABNORMAL HIGH (ref 65–99)
Glucose-Capillary: 68 mg/dL (ref 65–99)
Glucose-Capillary: 90 mg/dL (ref 65–99)
Glucose-Capillary: 133 mg/dL — ABNORMAL HIGH (ref 65–99)

## 2015-05-20 MED ORDER — INSULIN ASPART 100 UNIT/ML ~~LOC~~ SOLN: 0.0000 [IU] | Freq: Three times a day (TID) | SUBCUTANEOUS | Status: DC

## 2015-05-20 MED ORDER — INSULIN ASPART 100 UNIT/ML ~~LOC~~ SOLN: 0.0000 [IU] | Freq: Every day | SUBCUTANEOUS | Status: DC

## 2015-05-20 MED ORDER — METFORMIN HCL ER 500 MG PO TB24
500.0000 mg | ORAL_TABLET | Freq: Two times a day (BID) | ORAL | Status: DC
  Administered 2015-05-21 – 2015-05-22 (×3): 500 mg via ORAL
  Filled 2015-05-20 (×5): qty 1

## 2015-05-20 MED ORDER — OXYBUTYNIN CHLORIDE 5 MG PO TABS
2.5000 mg | ORAL_TABLET | Freq: Every evening | ORAL | Status: DC | PRN
  Administered 2015-05-20 – 2015-05-21 (×3): 2.5 mg via ORAL
  Filled 2015-05-20 (×3): qty 1

## 2015-05-20 MED ORDER — LORATADINE 10 MG PO TABS
10.0000 mg | ORAL_TABLET | Freq: Every day | ORAL | Status: DC
  Administered 2015-05-20 – 2015-05-22 (×3): 10 mg via ORAL
  Filled 2015-05-20 (×3): qty 1

## 2015-05-20 NOTE — Progress Notes (Signed)
Occupational Therapy Session Note  Patient Details  Name: Anne Herrera MRN: [id number] Date of Birth: Nov 24, 1935  Today's Date: 05/20/2015 OT Individual Time: 0700-0800 OT Individual Time Calculation (min): 60 min      Short Term Goals: Week 1:  OT Short Term Goal 1 (Week 1): Pt will transfer to/from toilet with Mod A OT Short Term Goal 1 - Progress (Week 1): Met OT Short Term Goal 2 (Week 1): Pt will complete toileting with Min A for thoroughness OT Short Term Goal 2 - Progress (Week 1): Met OT Short Term Goal 3 (Week 1): Pt will bathe 10 of 10 body parts with Min A OT Short Term Goal 3 - Progress (Week 1): Met OT Short Term Goal 4 (Week 1): Pt will dress lower body with Mod A using AE OT Short Term Goal 4 - Progress (Week 1): Met Week 2:  OT Short Term Goal 1 (Week 2): STG=LTG d/t short remaining LOS  Skilled Therapeutic Interventions/Progress Updates:    Pt engaged in BADL retraining including bathing at shower level and dressing with sit<>stand from EOB.  Pt amb wit RW to bathroom for shower.  Pt utilized AE appropriately for LB bathing and dressing tasks, requiring assistance with bathing buttocks, threading socks onto sock aid, fastening shoes, and donning AFO.  Pt completed grooming tasks seated in w/c at sink.  Focus on increased RUE use in functional tasks, functional amb with RW, sit<>stand, standing balance, and safety awareness.   Therapy Documentation Precautions:  Precautions Precautions: Fall Precaution Comments: Fell at [facility] IR while being assisted to toilet by RN Tech (w/o device) Restrictions Weight Bearing Restrictions: No Pain:  Pt denies pain ADL: ADL ADL Comments: see Functional Assessment  See Function Navigator for Current Functional Status.   Therapy/Group: Individual Therapy  Leroy Libman 05/20/2015, 8:13 AM

## 2015-05-20 NOTE — Progress Notes (Signed)
Patient tends to drop BS before lunch. Will change metformin to 500 mg bid and change SSI to sensitive scale.

## 2015-05-20 NOTE — Patient Care Conference (Signed)
Inpatient RehabilitationTeam Conference and Plan of Care Update Date: 05/20/2015   Time: 11;45 AM    Patient Name: Anne Herrera      Medical Record Number: [id number]  Date of Birth: Oct 02, 1935 Sex: Female         Room/Bed: 4M09C/4M09C-01 Payor Info: Payor: [employer] MEDICARE / Plan: [employer] MEDICARE CHOICE PPO / Product Type: *No Product type* /    Admitting Diagnosis: L CVA  Admit Date/Time:  05/06/2015  2:37 PM Admission Comments: No comment available   Primary Diagnosis:  Lacunar stroke of left subthalamic region Principal Problem: Lacunar stroke of left subthalamic region  Patient Active Problem List   Diagnosis Date Noted  . Diabetes 05/20/2015  . HTN (hypertension) 05/20/2015  . Spastic hemiplegia affecting right dominant side 05/07/2015  . 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 Lacunar stroke of left subthalamic region 05/06/2015  . TIA (transient ischemic attack) 04/28/2015    Expected Discharge Date: Expected Discharge Date: 05/22/15  Team Members Present: Physician leading conference: Dr. Claudette Laws Social Worker Present: Dossie Der, LCSW Nurse Present: Carmie End, RN PT Present: Laurin Coder, PT OT Present: Roney Mans, OT SLP Present: Jackalyn Lombard, SLP PPS Coordinator present : Tora Duck, RN, CRRN     Current Status/Progress Goal Weekly Team Focus  Medical   diabetic control and BP control is good  SNF  SNF search   Bowel/Bladder   Continent to bowel and bladder.  Min. A  monitor bowel and bladder function Q shift.   Swallow/Nutrition/ Hydration     na        ADL's   Steadying assist fo rtransferss, Min A dressing (lower/upper body), supervision for toileting and grooming  supervision toileting, toilet transfer, dressing and bathing with supervision (LT goals downgraded to Min A for bathing/dressing)  RUE NMR/FMC, dynamic standing balance, adapted bathing/dressing, AE training.   Mobility   min A overall  Supervision overall with car and  home goals D/C due to SNF placement  balance, gait, NMR R side   Communication     na        Safety/Cognition/ Behavioral Observations    no unsafe behaviors-cognitively intact        Pain   No complains of pain  Keep pain levels less than 3.  To monitor for pain Q 2-3 hrs.   Skin   The skin on bil. knee is dry and intact,kenalog cream aplied as order.  Keep skin free of breakdown with min. A.  To monitor skin condition Q shift and  PRN      *See Care Plan and progress notes for long and short-term goals.  Barriers to Discharge: none    Possible Resolutions to Barriers:  ready for D/C    Discharge Planning/Teaching Needs:  Looking for NH bed, per pt and family request-doesn;t have 24 hr care at home      Team Discussion:  Has AFO working well assists with her ambulation. Still requiring min assist level due to balance issues-is improving in therapies. Berg 25/56-high risk to fall still. Diabetes and HTN well controlled now.   Revisions to Treatment Plan:  Downgraded goals to min level- pt NHP   Continued Need for Acute Rehabilitation Level of Care: The patient requires daily medical management by a physician with specialized training in physical medicine and rehabilitation for the following conditions: Daily direction of a multidisciplinary physical rehabilitation program to ensure safe treatment while eliciting the highest outcome that is of practical value to the patient.:  Yes Daily medical management of patient stability for increased activity during participation in an intensive rehabilitation regime.: Yes Daily analysis of laboratory values and/or radiology reports with any subsequent need for medication adjustment of medical intervention for : Neurological problems;Other  Lucy Chris 05/20/2015, 3:18 PM

## 2015-05-20 NOTE — Progress Notes (Signed)
Social Work Patient ID: Anne Herrera, female   DOB: 1936-02-17, 79 y.o.   MRN: [id number] Spoke with [facility] who can offer pt a bed, but will need now to get pt's [facility] to approve. Have informed pt and she is pleased with this and have contacted her daughter to inform her also. Possible transfer on Friday.

## 2015-05-20 NOTE — Progress Notes (Addendum)
Patient ID: Anne Herrera, female   DOB: Feb 12, 1936, 79 y.o.   MRN: [id number] Subjective/Complaints: Up 3-4 times per night with bladder, some milder problems prior to CVA   ROS- freq urination, , no pain c/os, no swallowing c/os, no SOB or CP  Objective: Vital Signs: Blood pressure 146/65, pulse 66, temperature 98.4 F (36.9 C), temperature source Oral, resp. rate 16, height 5' 4"  (1.626 m), weight 84.369 kg (186 lb), SpO2 100 %. No results found. Results for orders placed or performed during the hospital encounter of 05/06/15 (from the past 72 hour(s))  Glucose, capillary     Status: Abnormal   Collection Time: 05/17/15 11:30 AM  Result Value Ref Range   Glucose-Capillary 52 (L) 65 - 99 mg/dL  Glucose, capillary     Status: None   Collection Time: 05/17/15 11:58 AM  Result Value Ref Range   Glucose-Capillary 95 65 - 99 mg/dL  Glucose, capillary     Status: Abnormal   Collection Time: 05/17/15  4:35 PM  Result Value Ref Range   Glucose-Capillary 103 (H) 65 - 99 mg/dL  Glucose, capillary     Status: Abnormal   Collection Time: 05/17/15  8:56 PM  Result Value Ref Range   Glucose-Capillary 138 (H) 65 - 99 mg/dL   Comment 1 Notify RN   Creatinine, serum     Status: Abnormal   Collection Time: 05/18/15  5:51 AM  Result Value Ref Range   Creatinine, Ser 1.03 (H) 0.44 - 1.00 mg/dL   GFR calc non Af Amer 51 (L) >60 mL/min   GFR calc Af Amer 60 (L) >60 mL/min    Comment: (NOTE) The eGFR has been calculated using the CKD EPI equation. This calculation has not been validated in all clinical situations. eGFR's persistently <60 mL/min signify possible Chronic Kidney Disease.   Glucose, capillary     Status: Abnormal   Collection Time: 05/18/15  6:39 AM  Result Value Ref Range   Glucose-Capillary 146 (H) 65 - 99 mg/dL  Glucose, capillary     Status: None   Collection Time: 05/18/15 11:33 AM  Result Value Ref Range   Glucose-Capillary 75 65 - 99 mg/dL  Glucose, capillary      Status: Abnormal   Collection Time: 05/18/15  4:29 PM  Result Value Ref Range   Glucose-Capillary 110 (H) 65 - 99 mg/dL  Glucose, capillary     Status: Abnormal   Collection Time: 05/18/15  8:39 PM  Result Value Ref Range   Glucose-Capillary 157 (H) 65 - 99 mg/dL  Glucose, capillary     Status: Abnormal   Collection Time: 05/19/15  5:58 AM  Result Value Ref Range   Glucose-Capillary 118 (H) 65 - 99 mg/dL   Comment 1 Notify RN   Glucose, capillary     Status: None   Collection Time: 05/19/15 11:48 AM  Result Value Ref Range   Glucose-Capillary 92 65 - 99 mg/dL  Glucose, capillary     Status: None   Collection Time: 05/19/15  5:02 PM  Result Value Ref Range   Glucose-Capillary 71 65 - 99 mg/dL  Glucose, capillary     Status: Abnormal   Collection Time: 05/19/15  9:04 PM  Result Value Ref Range   Glucose-Capillary 109 (H) 65 - 99 mg/dL  Glucose, capillary     Status: Abnormal   Collection Time: 05/20/15  6:55 AM  Result Value Ref Range   Glucose-Capillary 112 (H) 65 - 99 mg/dL  HEENT: normal and no strabismus Cardio: RRR and no murmur Resp: CTA B/L GI: BS positive and NT, ND Extremity:  No Edema Skin:   Other psoriatic patch bilateral pre patellar Neuro: Alert/Oriented, Cranial Nerve Abnormalities right central 7, Abnormal Sensory  R side, Abnormal Motor 3- R delt , bi, tri, grip,4- HF, KE,3- ADF, Abnormal FMC Ataxic/ dec FMC and Dysarthric Musc/Skel:  Other mild Right elbow flexion contracture Gen NAD   Assessment/Plan: 1. 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 Physiatrist is providing close team supervision and 24 hour management of active medical problems listed below. Physiatrist and rehab team continue to assess barriers to discharge/monitor patient progress toward functional and medical goals.  FIM: Function  - Bathing Position: Shower Body parts bathed by patient: Right arm, Chest, Left arm, Abdomen, Front perineal area, Right upper leg, Left upper leg, Right lower leg, Left lower leg Body parts bathed by helper: Back, Buttocks Bathing not applicable: Buttocks, Left lower leg, Right lower leg, Right arm Assist Level: Touching or steadying assistance(Pt > 75%)  Function- Upper Body Dressing/Undressing What is the patient wearing?: Pull over shirt/dress, Bra Bra - Perfomed by patient: Thread/unthread right bra strap, Thread/unthread left bra strap Bra - Perfomed by helper: Hook/unhook bra (pull down sports bra) Pull over shirt/dress - Perfomed by patient: Thread/unthread right sleeve, Thread/unthread left sleeve, Put head through opening, Pull shirt over trunk Pull over shirt/dress - Perfomed by helper: Pull shirt over trunk Assist Level: Touching or steadying assistance(Pt > 75%) Set up : To obtain clothing/put away Function - Lower Body Dressing/Undressing What is the patient wearing?: Underwear, Pants, Socks, Shoes, AFO Position: Wheelchair/chair at sink Underwear - Performed by patient: Thread/unthread right underwear leg, Thread/unthread left underwear leg Underwear - Performed by helper: Pull underwear up/down Pants- Performed by patient: Thread/unthread right pants leg, Thread/unthread left pants leg, Pull pants up/down, Fasten/unfasten pants Pants- Performed by helper: Pull pants up/down Socks - Performed by patient: Don/doff right sock, Don/doff left sock Socks - Performed by helper: Don/doff right sock, Don/doff left sock Shoes - Performed by patient: Don/doff left shoe, Fasten left, Fasten right Shoes - Performed by helper: Don/doff right shoe, Fasten right AFO - Performed by patient: Don/doff right AFO AFO - Performed by helper: Don/doff right AFO Assist Level: Touching or steadying assistance (Pt > 75%)  Function - Toileting Toileting steps completed by patient: Adjust clothing  prior to toileting, Performs perineal hygiene, Adjust clothing after toileting Toileting steps completed by helper: Adjust clothing prior to toileting, Adjust clothing after toileting Toileting Assistive Devices: Grab bar or rail Assist level: Touching or steadying assistance (Pt.75%)  Function - Air cabin crew transfer assistive device: Elevated toilet seat/BSC over toilet, Grab bar Mechanical lift: Stedy Assist level to toilet: Touching or steadying assistance (Pt > 75%) Assist level from toilet: Touching or steadying assistance (Pt > 75%) Assist level to bedside commode (at bedside): Touching or steadying assistance (Pt > 75%) Assist level from bedside commode (at bedside): Touching or steadying assistance (Pt > 75%)  Function - Chair/bed transfer Chair/bed transfer method: Stand pivot Chair/bed transfer assist level: Touching or steadying assistance (Pt > 75%) Chair/bed transfer assistive device: Orthosis, Walker Chair/bed transfer details: Manual facilitation for weight shifting, Verbal cues for technique, Verbal cues for sequencing  Function - Locomotion: Wheelchair Will patient use wheelchair at discharge?: Yes Type: Manual Max wheelchair distance: 125 Assist Level: Supervision  or verbal cues Assist Level: Supervision or verbal cues Wheel 150 feet activity did not occur: Safety/medical concerns (low endurance - unable to complete) Assist Level: Supervision or verbal cues Turns around,maneuvers to table,bed, and toilet,negotiates 3% grade,maneuvers on rugs and over doorsills: No Function - Locomotion: Ambulation Assistive device: Walker-rolling, Orthosis Max distance: 150 Assist level: Touching or steadying assistance (Pt > 75%) Assist level: Touching or steadying assistance (Pt > 75%) Walk 50 feet with 2 turns activity did not occur: N/A Assist level: Touching or steadying assistance (Pt > 75%) Walk 150 feet activity did not occur: Safety/medical concerns (low  endurance - unable) Assist level: Touching or steadying assistance (Pt > 75%) Walk 10 feet on uneven surfaces activity did not occur: Safety/medical concerns (R LE too unstable - unsafe)  Function - Comprehension Comprehension: Auditory Comprehension assist level: Follows complex conversation/direction with no assist  Function - Expression Expression: Verbal Expression assist level: Expresses complex ideas: With no assist  Function - Social Interaction Social Interaction assist level: Interacts appropriately with others with medication or extra time (anti-anxiety, antidepressant).  Function - Problem Solving Problem solving assist level: Solves basic 90% of the time/requires cueing < 10% of the time  Function - Memory Memory assist level: Recognizes or recalls 90% of the time/requires cueing < 10% of the time Patient normally able to recall (first 3 days only): Current season, Location of own room, Staff names and faces, That he or she is in a hospital  Medical Problem List and Plan: 1. 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 DVT Prophylaxis/Anticoagulation: Pharmaceutical: Lovenox 3. Pain Management: Tylenol prn pain. 4. Mood: LCSW to follow for evaluation and support.  5. Neuropsych: This patient is capable of making decisions on her own behalf. 6. Skin/Wound Care: Routine pressure relief measures.  7. Fluids/Electrolytes/Nutrition: Monitor I/O.Eats 100% meals . Improved fluid intake 8. HTN: Monitor bid. Continue coreg bid.Back on low dose chlothalidone. BP 146/65 , , No orthostatic changes, restarted cozaar, monitor for hypotension, currently on lower dose than at home 9. DM type 2: Monitor BS ac/hs.  Continue glipizide and metformin.  glucose 112 this am, control  improving 10. H/o Gout: Controlled off medications. Monitor for joint pain and swelling 11.  Psoriasis, local tx, has triamcinolone cream ordered 12.  Neurogenic bladder trial detrol LOS (Days) 14 A FACE TO FACE EVALUATION WAS PERFORMED  KIRSTEINS,ANDREW E 05/20/2015, 8:05 AM

## 2015-05-20 NOTE — Progress Notes (Signed)
Hypoglycemic Event  CBG: 68  Treatment: 15 GM carbohydrate snack  Symptoms: None  Follow-up CBG: 1220 Time: CBG Result: 113  Possible Reasons for Event: Unknown  Comments/MD notified:Pam Love PAC, orders recieved    Carlean Purl  Remember to initiate Hypoglycemia Order Set & complete

## 2015-05-20 NOTE — Progress Notes (Signed)
Physical Therapy Session Note  Patient Details  Name: Anne Herrera MRN: [id number] Date of Birth: 11/09/1935  Today's Date: 05/20/2015 PT Concurrent Time: 1030-1200 PT Concurrent Time Calculation (min): 90 min  Pt participated in therapeutic outing to [employer] to address community mobility including gait, w/c mobility, balance, safety awareness, and endurance as well as address energy conservation and community re-entry. Pt at overall S to steady A level for mobility except min A for van stairs. See goal sheet for details.   Therapy Documentation Precautions:  Precautions Precautions: Fall Precaution Comments: Fell at [facility] while being assisted to toilet by RN Tech (w/o device) Restrictions Weight Bearing Restrictions: No   See Function Navigator for Current Functional Status.   Therapy/Group: Community Re-integration: outing  Karolee Stamps Darrol Poke, PT, DPT  05/20/2015, 1:23 PM

## 2015-05-20 NOTE — Progress Notes (Signed)
Recreational Therapy Session Note  Patient Details  Name: Anne Herrera MRN: [id number] Date of Birth: 02/03/1936 Today's Date: 05/20/2015  Pain: no c/o Skilled Therapeutic Interventions/Progress Updates: Pt participated in community reintegration/outing to [employer] with focus on safe functional mobility on various community surface types, identification & negotiation of obstacles, & discussion of energy conservation techniques.  Pt required close supervision/steadying assist for sit-stands and ambulation using RW.  Pt required min assist for w/c mobility due to w/c height being too high for propulsion.  Pt appreciative of the outing.  See goal sheet in shadow chart for full details Therapy/Group: Community Reintegration   SIMPSON,LISA 05/20/2015, 1:26 PM

## 2015-05-20 NOTE — Discharge Summary (Signed)
Physician Discharge Summary  Patient ID: Unique Sillas MRN: [id number] DOB/AGE: [birth year] 79 y.o.  Admit date: 05/06/2015 Discharge date: 05/22/2015  Discharge Diagnoses:  Principal Problem:   Lacunar stroke of left subthalamic region Active Problems:   Spastic hemiplegia affecting right dominant side   Dysarthria, post-stroke   Diabetes   HTN (hypertension)   Discharged Condition: stable.    Labs:  Basic Metabolic Panel: BMP Latest Ref Rng 05/21/2015 05/18/2015 05/11/2015  Glucose 65 - 99 mg/dL 829(F) - 621(H)  BUN 6 - 20 mg/dL 15 - 15  Creatinine 0.86 - 1.00 mg/dL 5.78 4.69(G) 2.95  Sodium 135 - 145 mmol/L 131(L) - 136  Potassium 3.5 - 5.1 mmol/L 3.0(L) - 3.7  Chloride 101 - 111 mmol/L 96(L) - 104  CO2 22 - 32 mmol/L 27 - 24  Calcium 8.9 - 10.3 mg/dL 9.0 - 9.1     CBC: CBC Latest Ref Rng 05/07/2015 04/27/2015  WBC 4.0 - 10.5 K/uL 7.2 7.8  Hemoglobin 12.0 - 15.0 g/dL 11.2(L) 10.4(L)  Hematocrit 36.0 - 46.0 % 36.0 33.9(L)  Platelets 150 - 400 K/uL 291 306     CBG:  Recent Labs Lab 05/21/15 0654 05/21/15 1124 05/21/15 1653 05/21/15 2101 05/22/15 0643  GLUCAP 129* 81 99 92 112*    Brief HPI:   Anne Herrera is a 79 y.o. Left handed female with history of HTN, DM type 2, OSA, left lacunar infarct 04/29/15 who was discharged to home on 9/01 but readmitted to [facility] on 09/02 with slurred speech, inability to use RUE as well as RLE weakness with difficulty walking. MRI brain done revealing slightly enlarged left posterior putamen and left posterior CR infarct. TEE without evidence of thrombus and 30 day event monitor was placed. She was maintained on ASA 81 mg daily for secondary stroke prevention and permissive hypertension to help with perfusion. Patient with resultant right sided weakness and dysarthria and CIR was recommended by MD and Rehab team.    Hospital Course: [name] was admitted to rehab 05/06/2015 for inpatient therapies to consist of PT,  ST and OT at least three hours five days a week. Past admission physiatrist, therapy team and rehab RN have worked together to provide customized collaborative inpatient rehab. She was maintained on ASA for secondary stroke prevention. Blood pressures were monitored on bid basis and was noted to be poorly controlled. Chlothalidone and cozaar were resumed with improvement in BP control and no signs of orthostatic drop.  K dur was added to supplement hypokalemia.   Po intake has been good. Diabetes has been monitored with ac/hs checks. SSI was used for elevated BS. She has had hypoglycemic episodes prior to lunch and metformin was adjusted to 500 mg bid to prevent this drop. Psoriatic lesions on bilateral knees have improved with addition of triamcinolone cream.     Follow up CBC shows improvement in H/H.  Lytes have been monitored and hyponatreamia has resolved. She is continent of bowel and bladder. She continues to have urgency and low dose ditropan was added to help with symptoms. Urine culture was checked for workup and shows no growth.  She has been using her CPAP more consistently.   Mood has been stable and adjustment issues have resolved.  LCSW has been following prn for support. She was fitted with R-AFO to compensate for weak DF.  She has made good progress and requires min assist with ADL tasks and supervision with mobility.  She has elected on further therapy at  SNF and was discharged to [facility] on 05/22/15     Rehab course: During patient's stay in rehab weekly team conferences were held to monitor patient's progress, set goals and discuss barriers to discharge. At admission, patient required moderate assistance with ADL tasks and total assistance with mobility. She has had improvement in activity tolerance, balance, postural control, as well as ability to compensate for deficits. Speech therapy evaluation revealed MoCA score 27/30 and memory reported to be at baseline. She has mild  dysarthria and patient was educated on use of compensatory strategies for conversation.  She has had improvement in speech and was able to use strategies independent therefore ST signed off on 09/12.   She is has had improvement in functional use RUE  and RLE as well as improved awareness.  She requires steady assist for bathing tasks and lower body dressing. She is able to complete UB dressing with set up assist.  She is ambulating 175' X 2 with RW, A-AFO and SBA.  She requires steady assist on uneven surfaces.      Disposition: 01-Home or Self Care  Diet: Heart Healthy. Carb Modified diet.  Special Instructions: 1. Monitor BS with ac/hs checks.  2. Recheck lytes in the next 2-3 days.     Medication List    STOP taking these medications        aspirin 325 MG tablet  Replaced by:  aspirin 81 MG EC tablet      TAKE these medications        acetaminophen 325 MG tablet  Commonly known as:  TYLENOL  Take 1-2 tablets (325-650 mg total) by mouth every 4 (four) hours as needed for mild pain.     alum & mag hydroxide-simeth 200-200-20 MG/5ML suspension  Commonly known as:  MAALOX/MYLANTA  Take 30 mLs by mouth every 4 (four) hours as needed for indigestion.     aspirin 81 MG EC tablet  Take 1 tablet (81 mg total) by mouth daily.     atorvastatin 80 MG tablet  Commonly known as:  LIPITOR  Take 1 tablet (80 mg total) by mouth daily at 6 PM.     carvedilol 12.5 MG tablet  Commonly known as:  COREG  Take 1 tablet (12.5 mg total) by mouth 2 (two) times daily with a meal.     chlorthalidone 25 MG tablet  Commonly known as:  HYGROTON  Take 25 mg by mouth daily.     FLUoxetine 20 MG capsule  Commonly known as:  PROZAC  Take 1 capsule (20 mg total) by mouth daily.     glipiZIDE 2.5 MG 24 hr tablet  Commonly known as:  GLUCOTROL XL  Take 2.5 mg by mouth daily.     Lactase 9000 UNITS Chew  Chew 1 tablet (9,000 Units total) by mouth 3 (three) times daily with meals.     loratadine  10 MG tablet  Commonly known as:  CLARITIN  Take 1 tablet (10 mg total) by mouth daily.     losartan 25 MG tablet  Commonly known as:  COZAAR  Take 1 tablet (25 mg total) by mouth daily.     metFORMIN 500 MG 24 hr tablet  Commonly known as:  GLUCOPHAGE-XR  Take 1 tablet (500 mg total) by mouth 2 (two) times daily with a meal.     oxybutynin 5 MG tablet  Commonly known as:  DITROPAN  Take 0.5 tablets (2.5 mg total) by mouth at bedtime and may repeat dose  one time if needed.     polycarbophil 625 MG tablet  Commonly known as:  FIBERCON  Take 1 tablet (625 mg total) by mouth 2 (two) times daily.     potassium chloride 10 MEQ CR capsule  Commonly known as:  MICRO-K  Take 20 mEq by mouth daily.     senna-docusate 8.6-50 MG per tablet  Commonly known as:  Senokot-S  Take 1 tablet by mouth at bedtime as needed for mild constipation.     traZODone 50 MG tablet  Commonly known as:  DESYREL  Take 0.5-1 tablets (25-50 mg total) by mouth at bedtime as needed for sleep.     triamcinolone ointment 0.1 %  Commonly known as:  KENALOG  Apply topically 2 (two) times daily.         SignedJacquelynn Cree 05/22/2015, 11:22 AM

## 2015-05-20 NOTE — Progress Notes (Signed)
Physical Therapy Session Note  Patient Details  Name: Anne Herrera MRN: [id number] Date of Birth: Sep 02, 1935  Today's Date: 05/20/2015 PT Individual Time: 1400-1500 PT Individual Time Calculation (min): 60 min   Short Term Goals: Week 2:  PT Short Term Goal 1 (Week 2): Pt will demonstrate all bed mobility req SBA.  PT Short Term Goal 2 (Week 2): Pt will ambulate >= 130' req min A.  PT Short Term Goal 3 (Week 2): Pt will participate in an AFO evaluation with orthotist.  PT Short Term Goal 4 (Week 2): Pt will demosntrate dynamic stand balance req min A.  PT Short Term Goal 5 (Week 2): Pt will tolerate 5 minutes of standing during an activity for improved activity tolerance.   Skilled Therapeutic Interventions/Progress Updates:    Pt received up on toilet - agreeable to PT. Therapeutic Activity - see function tab for toilet transfer. PT instructs pt in furniture transfer from low soft sofa req min-mod A to stand and transfer back to w/c with RW and AFO.  PT instructs pt to attempt to use R arm to assist in underwear and pants management. Gait Training - see function tab for ambulation on level surface and stairs - PT instructs pt on stairs to ascend with R leg and descend with L leg for therapeutic strengthening. W/C Management: PT instructs pt in w/c propulsion with "B UEs for forced use/therapy for R UE - req SBA, but min A to make it over a doorway threshold. Pt ended in room with all needs in reach. Pt is continuing to progress slowly. PT specifically works on forward trunk lean during sit to stand with proper hand placement to reduce pt's compensation of tucking L foot under chair moreso than R foot. Cont per PT POC.   Therapy Documentation Precautions:  Precautions Precautions: Fall Precaution Comments: Fell at [facility] IR while being assisted to toilet by RN Tech (w/o device) Restrictions Weight Bearing Restrictions: No Pain:   Pt denies pain.    See Function Navigator for Current  Functional Status.   Therapy/Group: Individual Therapy  HYSLOP,AMANDA M 05/20/2015, 2:47 PM

## 2015-05-21 ENCOUNTER — Inpatient Hospital Stay (HOSPITAL_COMMUNITY): Payer: Medicare PPO | Admitting: Physical Therapy

## 2015-05-21 ENCOUNTER — Inpatient Hospital Stay (HOSPITAL_COMMUNITY): Payer: Medicare PPO

## 2015-05-21 DIAGNOSIS — I10 Essential (primary) hypertension: Secondary | ICD-10-CM

## 2015-05-21 LAB — BASIC METABOLIC PANEL
ANION GAP: 8 (ref 5–15)
BUN: 15 mg/dL (ref 6–20)
CALCIUM: 9 mg/dL (ref 8.9–10.3)
CO2: 27 mmol/L (ref 22–32)
Chloride: 96 mmol/L — ABNORMAL LOW (ref 101–111)
Creatinine, Ser: 0.88 mg/dL (ref 0.44–1.00)
Glucose, Bld: 114 mg/dL — ABNORMAL HIGH (ref 65–99)
POTASSIUM: 3 mmol/L — AB (ref 3.5–5.1)
Sodium: 131 mmol/L — ABNORMAL LOW (ref 135–145)

## 2015-05-21 LAB — GLUCOSE, CAPILLARY
GLUCOSE-CAPILLARY: 129 mg/dL — AB (ref 65–99)
GLUCOSE-CAPILLARY: 81 mg/dL (ref 65–99)
GLUCOSE-CAPILLARY: 99 mg/dL (ref 65–99)
Glucose-Capillary: 92 mg/dL (ref 65–99)

## 2015-05-21 NOTE — Progress Notes (Signed)
Physical Therapy Discharge Summary  Patient Details  Name: Anne Herrera MRN: [id number] Date of Birth: Apr 12, 1936  Today's Date: 05/21/2015 PT Individual Time: 7353-2992 PT Individual Time Calculation (min): 75 min    Patient has met 7 of 8 long term goals due to improved activity tolerance, improved balance, improved postural control, increased strength, increased range of motion, ability to compensate for deficits, functional use of  right upper extremity and right lower extremity and improved coordination.  Patient to discharge at a limited ambulatory, but primarily w/c  level Supervision.     Reasons goals not met: Pt continues to be unable to stand without physical assist from low, soft chairs or chairs without armrests.   Recommendation:  Patient will benefit from ongoing skilled PT services in skilled nursing facility setting to continue to advance safe functional mobility, address ongoing impairments in R side hemiplegia, limited activity tolerance, impaired standing balance, incoordination, difficulty with gait, and minimize fall risk.  Equipment: [place] Walk On AFO - Right  Reasons for discharge: discharge from hospital  Patient/family agrees with progress made and goals achieved: Yes  PT Discharge Precautions/Restrictions Precautions Precautions: Fall Precaution Comments: R hemiplegia Required Braces or Orthoses: Other Brace/Splint Other Brace/Splint: [place] Walk On AFO - R Restrictions Weight Bearing Restrictions: No  Pain Pain Assessment Pain Assessment: No/denies pain Vision/Perception  Perception Comments: wfl  Cognition Overall Cognitive Status: Within Functional Limits for tasks assessed Arousal/Alertness: Awake/alert Orientation Level: Oriented X4 Attention: Focused;Sustained;Selective Focused Attention: Appears intact Sustained Attention: Appears intact Selective Attention: Appears intact Memory: Appears intact Awareness: Appears  intact Problem Solving: Appears intact Safety/Judgment: Appears intact Sensation Sensation Light Touch: Impaired Detail Light Touch Impaired Details: Impaired RLE;Impaired RUE Stereognosis: Not tested Hot/Cold: Not tested Proprioception: Appears Intact Additional Comments: diminished to LT in R forearm and R leg Coordination Gross Motor Movements are Fluid and Coordinated: No Fine Motor Movements are Fluid and Coordinated: No Coordination and Movement Description: rigid, jerky movements due to hemiplegia and increased tone in R arm and R leg Finger Nose Finger Test: L wnl; R side demonstrates ataxia and decreased coordination of movement and slowed movement Heel Shin Test: L WNL; R side demonstrates slowed movement, decreased excursion of movement Motor  Motor Motor: Hemiplegia;Abnormal tone;Ataxia Motor - Discharge Observations: increased tone in R arm when trying to break out of synergy, ataxic movement in R arm reaching  Mobility Bed Mobility Bed Mobility: Rolling Right;Rolling Left;Left Sidelying to Sit;Sit to Supine Rolling Right: 6: Modified independent (Device/Increase time) Rolling Left: 5: Supervision Rolling Left Details: Verbal cues for sequencing;Visual cues/gestures for sequencing;Verbal cues for technique Rolling Left Details (indicate cue type and reason): push with R heel into bed while reaching with R arm for eob Left Sidelying to Sit: 5: Supervision Left Sidelying to Sit Details: Verbal cues for precautions/safety Sit to Supine: 6: Modified independent (Device/Increase time) Transfers Transfers: Yes Sit to Stand: 5: Supervision;With upper extremity assist;With armrests;From chair/3-in-1 Sit to Stand Details: Verbal cues for sequencing;Verbal cues for technique;Verbal cues for precautions/safety Stand to Sit: 5: Supervision;With upper extremity assist;To bed Stand to Sit Details (indicate cue type and reason): Verbal cues for sequencing;Verbal cues for  technique;Verbal cues for precautions/safety Stand Pivot Transfers: 5: Supervision Stand Pivot Transfer Details: Verbal cues for sequencing;Verbal cues for technique;Verbal cues for precautions/safety Locomotion  Ambulation Ambulation: Yes Ambulation/Gait Assistance: 5: Supervision Ambulation Distance (Feet): 200 Feet Assistive device: Rolling walker Ambulation/Gait Assistance Details: Verbal cues for precautions/safety Gait Gait: Yes Gait Pattern: Impaired Gait Pattern: Step-through pattern;Decreased hip/knee  flexion - right;Decreased weight shift to right;Right hip hike;Poor foot clearance - right Gait velocity: decreased Stairs / Additional Locomotion Stairs: Yes Stairs Assistance: 4: Min guard Stairs Assistance Details: Manual facilitation for weight shifting Stair Management Technique: Two rails Number of Stairs: 12 Height of Stairs: 6 Wheelchair Mobility Wheelchair Mobility: Yes Wheelchair Assistance: 5: Investment banker, operational Details: Verbal cues for Marketing executive: Both lower extermities;Both upper extremities Wheelchair Parts Management: Supervision/cueing Distance: 150  Trunk/Postural Assessment  Cervical Assessment Cervical Assessment: Exceptions to [facility] Cervical AROM Overall Cervical AROM: Deficits;Due to premorid status Overall Cervical AROM Comments: limited cervical side flexion 75% and limited cervical extension 75%; otherwise wfl Thoracic Assessment Thoracic Assessment: Exceptions to [facility] Thoracic AROM Overall Thoracic AROM: Deficits;Due to premorid status Overall Thoracic AROM Comments: grossly limited 75% Lumbar Assessment Lumbar Assessment: Exceptions to [facility] Lumbar AROM Overall Lumbar AROM: Deficits;Due to premorid status Overall Lumbar AROM Comments: grossly limited 75% Postural Control Trunk Control: retropulsive in sit Righting Reactions: delayed  Balance Balance Balance Assessed: Yes Static Sitting Balance Static  Sitting - Balance Support: Feet supported;No upper extremity supported Static Sitting - Level of Assistance: 6: Modified independent (Device/Increase time) Dynamic Sitting Balance Dynamic Sitting - Balance Support: Feet supported;Left upper extremity supported;Right upper extremity supported Dynamic Sitting - Level of Assistance: 5: Stand by assistance Static Standing Balance Static Standing - Balance Support: During functional activity;Bilateral upper extremity supported Static Standing - Level of Assistance: 5: Stand by assistance Dynamic Standing Balance Dynamic Standing - Balance Support: During functional activity;Bilateral upper extremity supported Dynamic Standing - Level of Assistance: 5: Stand by assistance Extremity Assessment  RUE Assessment RUE Assessment: Exceptions to [facility] RUE AROM (degrees) Overall AROM Right Upper Extremity: Deficits RUE Overall AROM Comments: arm flexion to 120 degrees; elbow/wrist/grip wfl RUE PROM (degrees) Overall PROM Right Upper Extremity: Within functional limits for tasks performed RUE Strength RUE Overall Strength: Deficits RUE Overall Strength Comments: arm flexion 3-/5, elbow flexion 4/5, elbow extension 3-/5, grip 4/5 RUE Tone RUE Tone: Hypertonic;Modified Ashworth Hypertonic Details: elbow flexors Modified Ashworth Scale for Grading Hypertonia RUE: Slight increase in muscle tone, manifested by a catch, followed by minimal resistance throughout the remainder (less than half) of the ROM LUE Assessment LUE Assessment: Within Functional Limits RLE Assessment RLE Assessment: Exceptions to [facility] RLE AROM (degrees) Overall AROM Right Lower Extremity: Deficits;Due to decreased strength RLE Overall AROM Comments: hip flexion limited 25%; knee wfl; ankle limited 50% by weakness RLE PROM (degrees) Overall PROM Right Lower Extremity: Within functional limits for tasks assessed RLE Strength RLE Overall Strength: Deficits RLE Overall Strength  Comments: hip flexion 3-/5, knee extension 4/5, knee flexion 3/5, ankle DF 2+/5 RLE Tone RLE Tone: Hypertonic Hypertonic Details: ankle PF LLE Assessment LLE Assessment: Within Functional Limits   See Function Navigator for Current Functional Status.  [facility] M 05/22/2015, 12:31 PM

## 2015-05-21 NOTE — Plan of Care (Signed)
Problem: RH Furniture Transfers Goal: LTG Patient will perform furniture transfers w/assist (OT/PT LTG: Patient will perform furniture transfers with assistance (OT/PT).  Outcome: Not Met (add Reason) Pt continues to require physical assist to stand from low, soft chairs or chairs without armrests.

## 2015-05-21 NOTE — Progress Notes (Signed)
Physical Therapy Session Note  Patient Details  Name: Anne Herrera MRN: [id number] Date of Birth: 01-Nov-1935  Today's Date: 05/21/2015 PT Individual Time: 4098-1191 Treatment Session 2: 1500-1530 PT Individual Time Calculation (min): 75 min Treatment Session 2: 30 min  Short Term Goals: Week 2:  PT Short Term Goal 1 (Week 2): Pt will demonstrate all bed mobility req SBA.  PT Short Term Goal 2 (Week 2): Pt will ambulate >= 130' req min A.  PT Short Term Goal 3 (Week 2): Pt will participate in an AFO evaluation with orthotist.  PT Short Term Goal 4 (Week 2): Pt will demosntrate dynamic stand balance req min A.  PT Short Term Goal 5 (Week 2): Pt will tolerate 5 minutes of standing during an activity for improved activity tolerance.   Skilled Therapeutic Interventions/Progress Updates:    Treatment Session 1: Pt received up in recliner - agreeable to PT session - R AFO in place. Gait Training - see function tab for details. PT provides minimal verbal interaction during ambulation on level surface due to pt's poor dual tasking skills and need to concentrate/focus on balance. Stairs completed with pt ascending with R foot and descending with R foot - no LOB req assist to correct. Therapeutic Activity - see function tab for bed mobility, transfers (bed, furniture, and car) details. Pt continues to compensate for poor forward trunk flexion and coordination of pushing up with arms & legs at same time by tucking L foot further under w/c than hemiplegic R foot. Pt req hands on assist when completing sit to stand transfer with equal foot distance, but is able to complete in compensated manner with SBA. W/C Management - see function tab for details. Pt initiates w/c propulsion with B LEs, but as these fatigue, pt switches to B UEs and is able to complete full distance of 150' including over thresholds, req encouragement and cues for technique for this distance. At end of session, pt transferred to toilet  and agrees to call for nurse tech when complete.   Treatment Session 2: Pt received up in recliner - asking questions about transferring to SNF. PT provided answers as able. Gait Training - PT instructs pt in ambulation from room to/from gym (175' x 2 reps) with RW and R AFO req SBA with dual task cognitive challenge - no LOB. PT instructs pt in ambulation with RW and R AFO on uneven surface (mat) 2 x 10' req steadying assist and verbal cues for safe walker management. Pt ended in recliner in room - nurse tech notified pt wishes to brush her teeth.    Therapy Documentation Precautions:  Precautions Precautions: Fall Precaution Comments: R hemiplegia Required Braces or Orthoses: Other Brace/Splint Other Brace/Splint: [place] Walk On AFO - R Restrictions Weight Bearing Restrictions: No Pain: Pain Assessment Pain Assessment: No/denies pain Pain Score: 0-No pain Treatment Session 2: Pt denies pain.    See Function Navigator for Current Functional Status.   Therapy/Group: Individual Therapy  HYSLOP,AMANDA M 05/21/2015, 12:23 PM

## 2015-05-21 NOTE — Progress Notes (Signed)
Social Work Patient ID: Anne Herrera, female   DOB: 1936/02/26, 79 y.o.   MRN: [id number] Continue to await insurance approval for the move to [facility].  Kim-Admission coordinator has not heard anything yet back form the insurance. Pt aware of this information and will let her know when hear anything.

## 2015-05-21 NOTE — Progress Notes (Signed)
Occupational Therapy Session Note  Patient Details  Name: Anne Herrera MRN: [id number] Date of Birth: [birth year]  Today's Date: 05/21/2015 OT Individual Time:730-830 60 min  Short Term Goals: Week 2:  OT Short Term Goal 1 (Week 2): STG=LTG d/t short remaining LOS  Skilled Therapeutic Interventions/Progress Updates: ADL-retaining at shower level with focus on improved awareness, transfers and memory.   Pt received supine in bed and requires extra time to roll to her left and sit at EOB. Pt reports improved sleep and is aware of plan for discharge to SNF.   Pt reiterates that maintaining her home is not a primary concern as she would prefer safe environment with assistance provided when needed.   Pt progresses through session at overall min assist level, standing supported with steadying assist to bathe her buttocks and to don underwear and right AFO.   Pt grooms unassisted and self-feeds independently after setup.   Pt remains seated in w/c at end of session with all needs within reach.    Therapy Documentation Precautions:  Precautions Precautions: Fall Precaution Comments: R hemiplegia Required Braces or Orthoses: Other Brace/Splint Other Brace/Splint: [place] Walk On AFO - R Restrictions Weight Bearing Restrictions: No  Vital Signs: Therapy Vitals Temp: 98.3 F (36.8 C) Temp Source: Oral Pulse Rate: 72 Resp: 20 BP: (!) 142/59 mmHg Patient Position (if appropriate): Sitting Oxygen Therapy SpO2: 100 % O2 Device: Not Delivered   Pain: No/denies pain   ADL: ADL ADL Comments: see Functional Assessment  See Function Navigator for Current Functional Status.   Therapy/Group: Individual Therapy   Second session: Time: 1400-1430 Time Calculation (min):  30 min  Pain Assessment: No/denies pain  Skilled Therapeutic Interventions: ADL-retraining with focus on performance of toilet transfers and toileting (15 min) and therapeutic activity (15 min) with focus on  dynamic standing balance and functional use of RUE.   Pt received seated in recliner.  Pt rises to stand at [facility] with steadying assist and ambulates to bathroom using RW.   Pt completes transfer to toilet with supervision and then stands to manage clothing.  Pt completes hygiene while seated using wet wipes for thoroughness.   Pt rises and pulls up her pants and awaits assist with mobility from toilet.   Pt proceeds through therapeutic task of removing cards and decorations and packs her travel bag as supervised by therapist.   No LOB noted however pt benefits from supervision during task d/t mild right neglect with fall risk d/t RLE weakness when moving laterally.   Pt returns to recliner at end of session with all needs within reach.  See FIM for current functional status  Therapy/Group: Individual Therapy  BARTHOLD,FRANK 05/21/2015, 3:06 PM

## 2015-05-21 NOTE — Progress Notes (Signed)
Patient ID: Anne Herrera, female   DOB: 01-22-1936, 79 y.o.   MRN: [id number] Subjective/Complaints:  Just out of shower, aware of NH plans in [place] urination, , no pain c/os, no swallowing c/os, no SOB or CP  Objective: Vital Signs: Blood pressure 138/58, pulse 68, temperature 98.1 F (36.7 C), temperature source Oral, resp. rate 18, height 5' 4"  (1.626 m), weight 82.918 kg (182 lb 12.8 oz), SpO2 98 %. No results found. Results for orders placed or performed during the hospital encounter of 05/06/15 (from the past 72 hour(s))  Glucose, capillary     Status: None   Collection Time: 05/18/15 11:33 AM  Result Value Ref Range   Glucose-Capillary 75 65 - 99 mg/dL  Glucose, capillary     Status: Abnormal   Collection Time: 05/18/15  4:29 PM  Result Value Ref Range   Glucose-Capillary 110 (H) 65 - 99 mg/dL  Glucose, capillary     Status: Abnormal   Collection Time: 05/18/15  8:39 PM  Result Value Ref Range   Glucose-Capillary 157 (H) 65 - 99 mg/dL  Glucose, capillary     Status: Abnormal   Collection Time: 05/19/15  5:58 AM  Result Value Ref Range   Glucose-Capillary 118 (H) 65 - 99 mg/dL   Comment 1 Notify RN   Glucose, capillary     Status: None   Collection Time: 05/19/15 11:48 AM  Result Value Ref Range   Glucose-Capillary 92 65 - 99 mg/dL  Glucose, capillary     Status: None   Collection Time: 05/19/15  5:02 PM  Result Value Ref Range   Glucose-Capillary 71 65 - 99 mg/dL  Glucose, capillary     Status: Abnormal   Collection Time: 05/19/15  9:04 PM  Result Value Ref Range   Glucose-Capillary 109 (H) 65 - 99 mg/dL  Glucose, capillary     Status: Abnormal   Collection Time: 05/20/15  6:55 AM  Result Value Ref Range   Glucose-Capillary 112 (H) 65 - 99 mg/dL  Glucose, capillary     Status: None   Collection Time: 05/20/15 12:10 PM  Result Value Ref Range   Glucose-Capillary 68 65 - 99 mg/dL  Glucose, capillary     Status: Abnormal   Collection Time:  05/20/15 12:54 PM  Result Value Ref Range   Glucose-Capillary 113 (H) 65 - 99 mg/dL  Glucose, capillary     Status: None   Collection Time: 05/20/15  4:41 PM  Result Value Ref Range   Glucose-Capillary 90 65 - 99 mg/dL  Glucose, capillary     Status: Abnormal   Collection Time: 05/20/15  8:33 PM  Result Value Ref Range   Glucose-Capillary 133 (H) 65 - 99 mg/dL  Basic metabolic panel     Status: Abnormal   Collection Time: 05/21/15  3:45 AM  Result Value Ref Range   Sodium 131 (L) 135 - 145 mmol/L   Potassium 3.0 (L) 3.5 - 5.1 mmol/L   Chloride 96 (L) 101 - 111 mmol/L   CO2 27 22 - 32 mmol/L   Glucose, Bld 114 (H) 65 - 99 mg/dL   BUN 15 6 - 20 mg/dL   Creatinine, Ser 0.88 0.44 - 1.00 mg/dL   Calcium 9.0 8.9 - 10.3 mg/dL   GFR calc non Af Amer >60 >60 mL/min   GFR calc Af Amer >60 >60 mL/min    Comment: (NOTE) The eGFR has been calculated using the CKD EPI equation. This calculation has not  been validated in all clinical situations. eGFR's persistently <60 mL/min signify possible Chronic Kidney Disease.    Anion gap 8 5 - 15  Glucose, capillary     Status: Abnormal   Collection Time: 05/21/15  6:54 AM  Result Value Ref Range   Glucose-Capillary 129 (H) 65 - 99 mg/dL     HEENT: normal and no strabismus Cardio: RRR and no murmur Resp: CTA B/L GI: BS positive and NT, ND Extremity:  No Edema Skin:   Other psoriatic patch bilateral pre patellar, less dry Neuro: Alert/Oriented, Cranial Nerve Abnormalities right central 7, Abnormal Sensory  R side, Abnormal Motor 3- R delt , bi, tri, grip,4- HF, KE,3- ADF, Abnormal FMC Ataxic/ dec FMC and Dysarthric Musc/Skel:  Other mild Right elbow flexion contracture, 5th dig flex contracture Gen NAD   Assessment/Plan: 1. 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 Physiatrist is providing  close team supervision and 24 hour management of active medical problems listed below. Physiatrist and rehab team continue to assess barriers to discharge/monitor patient progress toward functional and medical goals.  FIM: Function - Bathing Position: Shower Body parts bathed by patient: Right arm, Left arm, Chest, Abdomen, Front perineal area, Right upper leg, Left upper leg, Right lower leg, Left lower leg Body parts bathed by helper: Buttocks, Back Bathing not applicable: Buttocks, Left lower leg, Right lower leg, Right arm Assist Level: Touching or steadying assistance(Pt > 75%)  Function- Upper Body Dressing/Undressing What is the patient wearing?: Bra, Pull over shirt/dress Bra - Perfomed by patient: Thread/unthread right bra strap, Thread/unthread left bra strap, Hook/unhook bra (pull down sports bra) Bra - Perfomed by helper: Hook/unhook bra (pull down sports bra) Pull over shirt/dress - Perfomed by patient: Thread/unthread right sleeve, Thread/unthread left sleeve, Put head through opening, Pull shirt over trunk Pull over shirt/dress - Perfomed by helper: Pull shirt over trunk Assist Level: Set up Set up : To obtain clothing/put away Function - Lower Body Dressing/Undressing What is the patient wearing?: Underwear, Pants, Socks, Shoes, AFO Position: Wheelchair/chair at sink Underwear - Performed by patient: Thread/unthread right underwear leg, Thread/unthread left underwear leg Underwear - Performed by helper: Pull underwear up/down Pants- Performed by patient: Thread/unthread right pants leg, Thread/unthread left pants leg Pants- Performed by helper: Pull pants up/down Socks - Performed by patient: Don/doff right sock, Don/doff left sock Socks - Performed by helper: Don/doff right sock, Don/doff left sock Shoes - Performed by patient: Don/doff left shoe, Fasten left Shoes - Performed by helper: Don/doff right shoe, Fasten right AFO - Performed by patient: Don/doff right  AFO AFO - Performed by helper: Don/doff right AFO Assist Level: Touching or steadying assistance (Pt > 75%)  Function - Toileting Toileting steps completed by patient: Adjust clothing prior to toileting, Performs perineal hygiene, Adjust clothing after toileting Toileting steps completed by helper: Adjust clothing prior to toileting, Adjust clothing after toileting Toileting Assistive Devices: Grab bar or rail Assist level: Touching or steadying assistance (Pt.75%)  Function - Air cabin crew transfer assistive device: Grab bar Mechanical lift: Stedy Assist level to toilet: Touching or steadying assistance (Pt > 75%) Assist level from toilet: Touching or steadying assistance (Pt > 75%) Assist level to bedside commode (at bedside): Touching or steadying assistance (Pt > 75%) Assist level from bedside commode (at bedside): Touching or steadying assistance (Pt > 75%)  Function - Chair/bed transfer Chair/bed transfer method: Ambulatory  Chair/bed transfer assist level: Touching or steadying assistance (Pt > 75%) Chair/bed transfer assistive device: Walker, Armrests, Orthosis Chair/bed transfer details: Manual facilitation for weight shifting, Verbal cues for safe use of DME/AE  Function - Locomotion: Wheelchair Will patient use wheelchair at discharge?: Yes Type: Manual Max wheelchair distance: 58' Assist Level: Supervision or verbal cues Assist Level: Supervision or verbal cues Wheel 150 feet activity did not occur: Safety/medical concerns (low endurance - unable to complete) Assist Level: Supervision or verbal cues Turns around,maneuvers to table,bed, and toilet,negotiates 3% grade,maneuvers on rugs and over doorsills: No Function - Locomotion: Ambulation Assistive device: Walker-rolling, Orthosis Max distance: 200' Assist level: Touching or steadying assistance (Pt > 75%) Assist level: Touching or steadying assistance (Pt > 75%) Walk 50 feet with 2 turns activity did not  occur: N/A Assist level: Touching or steadying assistance (Pt > 75%) Walk 150 feet activity did not occur: Safety/medical concerns (low endurance - unable) Assist level: Touching or steadying assistance (Pt > 75%) Walk 10 feet on uneven surfaces activity did not occur: Safety/medical concerns (R LE too unstable - unsafe) Assist level: Touching or steadying assistance (Pt > 75%)  Function - Comprehension Comprehension: Auditory Comprehension assist level: Follows complex conversation/direction with no assist  Function - Expression Expression: Verbal Expression assist level: Expresses complex ideas: With no assist  Function - Social Interaction Social Interaction assist level: Interacts appropriately with others with medication or extra time (anti-anxiety, antidepressant).  Function - Problem Solving Problem solving assist level: Solves basic 90% of the time/requires cueing < 10% of the time  Function - Memory Memory assist level: Recognizes or recalls 90% of the time/requires cueing < 10% of the time Patient normally able to recall (first 3 days only): Current season, Location of own room, Staff names and faces, That he or she is in a hospital  Medical Problem List and Plan: 1. 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 DVT Prophylaxis/Anticoagulation: Pharmaceutical: Lovenox 3. Pain Management: Tylenol prn pain. 4. Mood: LCSW to follow for evaluation and support.  5. Neuropsych: This patient is capable of making decisions on her own behalf. 6. Skin/Wound Care: Routine pressure relief measures.  7. Fluids/Electrolytes/Nutrition: Monitor I/O.Eats 100% meals . Improved fluid intake 8. HTN: Monitor bid. Continue coreg bid.Back on low dose chlothalidone. BP 138/65 , , No  orthostatic changes, restarted cozaar, monitor for hypotension, currently on lower dose than at home 9. DM type 2: Monitor BS ac/hs.  Continue glipizide and metformin.  glucose 129 this am, control improving 10. H/o Gout: Controlled off medications. Monitor for joint pain and swelling 11.  Psoriasis, local tx, has triamcinolone cream ordered 12.  Neurogenic bladder trial ditropan improved nocturia LOS (Days) 15 A FACE TO FACE EVALUATION WAS PERFORMED  KIRSTEINS,ANDREW E 05/21/2015, 7:57 AM

## 2015-05-22 ENCOUNTER — Inpatient Hospital Stay (HOSPITAL_COMMUNITY): Payer: Medicare PPO | Admitting: Physical Therapy

## 2015-05-22 ENCOUNTER — Inpatient Hospital Stay (HOSPITAL_COMMUNITY): Payer: Medicare PPO

## 2015-05-22 ENCOUNTER — Encounter
Admission: RE | Admit: 2015-05-22 | Discharge: 2015-05-22 | Disposition: A | Discharge status: Home / Self Care | Payer: Medicare PPO | Source: Ambulatory Visit | Attending: Internal Medicine | Admitting: Internal Medicine

## 2015-05-22 DIAGNOSIS — I10 Essential (primary) hypertension: Secondary | ICD-10-CM

## 2015-05-22 DIAGNOSIS — G8111 Spastic hemiplegia affecting right dominant side: Secondary | ICD-10-CM

## 2015-05-22 DIAGNOSIS — E876 Hypokalemia: Secondary | ICD-10-CM | POA: Insufficient documentation

## 2015-05-22 DIAGNOSIS — I69322 Dysarthria following cerebral infarction: Secondary | ICD-10-CM

## 2015-05-22 DIAGNOSIS — I6350 Cerebral infarction due to unspecified occlusion or stenosis of unspecified cerebral artery: Secondary | ICD-10-CM

## 2015-05-22 DIAGNOSIS — E871 Hypo-osmolality and hyponatremia: Secondary | ICD-10-CM | POA: Diagnosis not present

## 2015-05-22 LAB — GLUCOSE, CAPILLARY
GLUCOSE-CAPILLARY: 112 mg/dL — AB (ref 65–99)
Glucose-Capillary: 89 mg/dL (ref 65–99)

## 2015-05-22 MED ORDER — SENNOSIDES-DOCUSATE SODIUM 8.6-50 MG PO TABS: 1.0000 | ORAL_TABLET | Freq: Every evening | ORAL | Status: DC | PRN

## 2015-05-22 MED ORDER — POTASSIUM CHLORIDE CRYS ER 20 MEQ PO TBCR
20.0000 meq | EXTENDED_RELEASE_TABLET | Freq: Two times a day (BID) | ORAL | Status: DC
  Administered 2015-05-22: 20 meq via ORAL
  Filled 2015-05-22: qty 1

## 2015-05-22 MED ORDER — LACTASE 9000 UNITS PO CHEW: 9000.0000 [IU] | CHEWABLE_TABLET | Freq: Three times a day (TID) | ORAL | Status: AC

## 2015-05-22 MED ORDER — CARVEDILOL 12.5 MG PO TABS: 12.5000 mg | ORAL_TABLET | Freq: Two times a day (BID) | ORAL | Status: AC

## 2015-05-22 MED ORDER — ALUM & MAG HYDROXIDE-SIMETH 200-200-20 MG/5ML PO SUSP: 30.0000 mL | ORAL | Status: AC | PRN

## 2015-05-22 MED ORDER — ASPIRIN 81 MG PO TBEC: 81.0000 mg | DELAYED_RELEASE_TABLET | Freq: Every day | ORAL | Status: DC

## 2015-05-22 MED ORDER — ATORVASTATIN CALCIUM 80 MG PO TABS: 80.0000 mg | ORAL_TABLET | Freq: Every day | ORAL | Status: AC

## 2015-05-22 MED ORDER — CALCIUM POLYCARBOPHIL 625 MG PO TABS: 625.0000 mg | ORAL_TABLET | Freq: Two times a day (BID) | ORAL | Status: AC

## 2015-05-22 MED ORDER — ACETAMINOPHEN 325 MG PO TABS: 325.0000 mg | ORAL_TABLET | ORAL | Status: AC | PRN

## 2015-05-22 MED ORDER — LORATADINE 10 MG PO TABS: 10.0000 mg | ORAL_TABLET | Freq: Every day | ORAL | Status: AC

## 2015-05-22 MED ORDER — FLUOXETINE HCL 20 MG PO CAPS: 20.0000 mg | ORAL_CAPSULE | Freq: Every day | ORAL | Status: DC

## 2015-05-22 MED ORDER — OXYBUTYNIN CHLORIDE 5 MG PO TABS: 2.5000 mg | ORAL_TABLET | Freq: Every evening | ORAL | Status: DC | PRN

## 2015-05-22 MED ORDER — TRIAMCINOLONE ACETONIDE 0.1 % EX OINT: TOPICAL_OINTMENT | Freq: Two times a day (BID) | CUTANEOUS | Status: DC

## 2015-05-22 MED ORDER — LOSARTAN POTASSIUM 25 MG PO TABS: 25.0000 mg | ORAL_TABLET | Freq: Every day | ORAL | Status: DC

## 2015-05-22 MED ORDER — METFORMIN HCL ER 500 MG PO TB24: 500.0000 mg | ORAL_TABLET | Freq: Two times a day (BID) | ORAL | Status: DC

## 2015-05-22 MED ORDER — TRAZODONE HCL 50 MG PO TABS: 25.0000 mg | ORAL_TABLET | Freq: Every evening | ORAL | Status: DC | PRN

## 2015-05-22 NOTE — Progress Notes (Signed)
Social Work Patient ID: Anne Herrera, female   DOB: 1936/06/20, 79 y.o.   MRN: [id number] Insurance authorization received for transfer today to [facility]. Pt is arranging her friend to transport her there. Will gather paperwork and prepare her to go.

## 2015-05-22 NOTE — Progress Notes (Signed)
Physical Therapy Session Note  Patient Details  Name: Anne Herrera MRN: [id number] Date of Birth: 04-18-1936  Today's Date: 05/22/2015 PT Individual Time: 4098-1191 PT Individual Time Calculation (min): 45 min   Short Term Goals: Week 2:  PT Short Term Goal 1 (Week 2): Pt will demonstrate all bed mobility req SBA.  PT Short Term Goal 2 (Week 2): Pt will ambulate >= 130' req min A.  PT Short Term Goal 3 (Week 2): Pt will participate in an AFO evaluation with orthotist.  PT Short Term Goal 4 (Week 2): Pt will demosntrate dynamic stand balance req min A.  PT Short Term Goal 5 (Week 2): Pt will tolerate 5 minutes of standing during an activity for improved activity tolerance.   Skilled Therapeutic Interventions/Progress Updates:    Pt received up in w/c - requesting to eat breakfast. Therapy session started 15 minutes after scheduled time to accommodate pt's ability to eat. Gait Training - PT instructs pt in dual task technique of saying alphabet backwards req min-mod cues for correct alphabet sequence and SBA for gait - see function tab for details. Nueromuscular Reeducation: pt reports being concerned about R arm not functioning as well as she wants. PT instructs pt in use of wall finger ladder req mod A due to lack of fine motor control - arm achieves 120 degrees flexion. PT instructs pt in AAROM arm elevation activity with 1# weighted bar 3 x 10 reps - various grip for shoulder comfort: overhand grip begins to produce shoulder pain, so grip transitioned to edge of bar for "thumbs up" position but pt has difficulty grasping bar, so grip transitioned to underhand grip which is pain free for shoulder and effective in producing elevation. PT instructs pt in high level balance/ambulation activities including side stepping R/L, backwards walking - req increased time and SBA for safety with RW. Pt ended in w/c.   Therapy Documentation Precautions:  Precautions Precautions: Fall Precaution  Comments: R hemiplegia Required Braces or Orthoses: Other Brace/Splint Other Brace/Splint: [place] Walk On AFO - R Restrictions Weight Bearing Restrictions: No Pain: Pain Assessment Pain Assessment: No/denies pain   See Function Navigator for Current Functional Status.   Therapy/Group: Individual Therapy  HYSLOP,AMANDA M 05/22/2015, 9:11 AM

## 2015-05-22 NOTE — Progress Notes (Signed)
Occupational Therapy Session and Discharge Summary  Patient Details  Name: Anne Herrera MRN: [id number] Date of Birth: March 19, 1936  Today's Date: 05/22/2015 OT Individual Time: 0175-1025 OT Individual Time Calculation (min): 60 min   Skilled Intervention: ADL-retraining with re-ed on use of AE to improve lower body dressing skills.   Pt completes sit>stand and ambulates to bathroom with steadying assist.   Pt transfers to bench with supervision and bathes with min assist only to wash buttocks thoroughly.   Pt returns to w/c to dress near EOB but requires vc to reinstall heart monitor and vc for sequencing d/t mildly impaired memory.   OT re-educates pt on use of step stool and/or lower surface to don shoes as she will attempt reaching beyond her stable base while seated to don shoes from her w/c d/t mild impulsivity.   Pt able to don AFO with min assist and vc to problem-solve while seated in recliner.  Pt grooms and self-feeds unassisted.   Pt reiterates to therapist that she is re-evaluating her long-term care needs and may seek residency at ALF or similar facility rather than return to her home to live independently.                                                                                                       Discharge Summary  Patient has met 8 of 11 long term goals due to improved activity tolerance, improved balance, ability to compensate for deficits, functional use of  RIGHT upper extremity and improved awareness.  Patient to discharge at [facility] Assist level.  Patient's care partner unavailable to provide the necessary physical assistance at discharge.    Reasons goals not met: Pt deferred attention to goals relating to homemaking d/t her expressed plan to transfer to SNF for continued rehab or her alternate plan to seek resident status at ALF as preferable to living alone in her home.   Pt remained fearful while standing supported and consistently requested steadying assist  during dynamic standing tasks while performing BADL.  Recommendation:  Patient will benefit from ongoing skilled OT services in skilled nursing facility setting to continue to advance functional skills in the area of BADL and iADL.  Equipment: No equipment provided  Reasons for discharge: discharge from hospital  Patient/family agrees with progress made and goals achieved: Yes  OT Discharge Precautions/Restrictions  Precautions Precautions: Fall Precaution Comments: R hemiplegia Other Brace/Splint: [place] Walk On AFO - R Restrictions Weight Bearing Restrictions: No  Pain No/denies pain   ADL ADL ADL Comments: see Functional Assessment   Vision/Perception  Vision- History Baseline Vision/History: Wears glasses Wears Glasses: Reading only Patient Visual Report: No change from baseline Vision- Assessment Vision Assessment?: No apparent visual deficits Perception Comments: WFL   Cognition Overall Cognitive Status: Within Functional Limits for tasks assessed Arousal/Alertness: Awake/alert Orientation Level: Oriented X4 Attention: Focused;Sustained;Selective Focused Attention: Appears intact Sustained Attention: Appears intact Selective Attention: Appears intact Memory: Impaired Memory Impairment: Decreased short term memory Decreased Short Term Memory: Functional complex Awareness: Appears intact Problem Solving: Impaired Problem Solving Impairment: Functional complex  Safety/Judgment: Appears intact   Sensation Sensation Light Touch: Impaired Detail Light Touch Impaired Details: Impaired RLE;Impaired RUE Stereognosis: Appears Intact Proprioception: Appears Intact Additional Comments: diminished to LT in R forearm and R leg Coordination Gross Motor Movements are Fluid and Coordinated: No Fine Motor Movements are Fluid and Coordinated: No Coordination and Movement Description: rigid, jerky movements due to hemiplegia and increased tone in R arm and R  leg Finger Nose Finger Test: L wnl; R side demonstrates ataxia and decreased coordination of movement and slowed movement Heel Shin Test: L WNL; R side demonstrates slowed movement, decreased excursion of movement   Motor  Motor Motor: Hemiplegia;Abnormal tone;Ataxia Motor - Skilled Clinical Observations: R side hemiplegia; flexor tone in R bicepts Motor - Discharge Observations: increased tone in R arm when trying to break out of synergy, ataxic movement in R arm reaching   Mobility  Bed Mobility Bed Mobility: Rolling Right;Rolling Left;Left Sidelying to Sit;Sit to Supine Rolling Right: 6: Modified independent (Device/Increase time) Rolling Left: 5: Supervision Rolling Left Details: Verbal cues for sequencing;Visual cues/gestures for sequencing;Verbal cues for technique Left Sidelying to Sit: 5: Supervision Left Sidelying to Sit Details: Verbal cues for precautions/safety Transfers Sit to Stand: 5: Supervision;With upper extremity assist;With armrests;From chair/3-in-1 Sit to Stand Details: Verbal cues for sequencing;Verbal cues for technique;Verbal cues for precautions/safety Stand to Sit: 5: Supervision;With upper extremity assist;To bed Stand to Sit Details (indicate cue type and reason): Verbal cues for sequencing;Verbal cues for technique;Verbal cues for precautions/safety   Trunk/Postural Assessment  Cervical Assessment Cervical Assessment: Exceptions to [facility] Cervical AROM Overall Cervical AROM: Deficits;Due to premorid status Overall Cervical AROM Comments: limited cervical side flexion 75% and limited cervical extension 75%; otherwise wfl Thoracic Assessment Thoracic Assessment: Exceptions to [facility] Thoracic AROM Overall Thoracic AROM: Deficits;Due to premorid status Overall Thoracic AROM Comments: grossly limited 75% Lumbar Assessment Lumbar Assessment: Exceptions to [facility] Lumbar AROM Overall Lumbar AROM: Deficits;Due to premorid status Overall Lumbar AROM Comments: grossly  limited 75% Postural Control Postural Control: Deficits on evaluation Trunk Control: retropulsive in sit Righting Reactions: delayed   Balance Balance Balance Assessed: Yes Berg Balance Test Sit to Stand: Needs minimal aid to stand or to stabilize Standing Unsupported: Able to stand 2 minutes with supervision Sitting with Back Unsupported but Feet Supported on Floor or Stool: Able to sit safely and securely 2 minutes Stand to Sit: Controls descent by using hands Standing Unsupported with Eyes Closed: Able to stand 10 seconds with supervision Standing Ubsupported with Feet Together: Needs help to attain position but able to stand for 30 seconds with feet together From Standing, Reach Forward with Outstretched Arm: Can reach forward >12 cm safely (5") From Standing Position, Pick up Object from Floor: Able to pick up shoe, needs supervision From Standing Position, Turn to Look Behind Over each Shoulder: Turn sideways only but maintains balance Turn 360 Degrees: Needs assistance while turning Standing Unsupported, Alternately Place Feet on Step/Stool: Needs assistance to keep from falling or unable to try Standing on One Leg: Unable to try or needs assist to prevent fall Static Sitting Balance Static Sitting - Balance Support: Feet supported;No upper extremity supported Static Sitting - Level of Assistance: 6: Modified independent (Device/Increase time) Dynamic Sitting Balance Dynamic Sitting - Balance Support: Feet supported;Left upper extremity supported;Right upper extremity supported Dynamic Sitting - Level of Assistance: 5: Stand by assistance Static Standing Balance Static Standing - Balance Support: During functional activity;Bilateral upper extremity supported Static Standing - Level of Assistance: 5: Stand by assistance Dynamic  Standing Balance Dynamic Standing - Balance Support: During functional activity;Bilateral upper extremity supported Dynamic Standing - Level of  Assistance: 5: Stand by assistance   Extremity/Trunk Assessment RUE Assessment RUE Assessment: Exceptions to [facility] RUE AROM (degrees) Overall AROM Right Upper Extremity: Deficits RUE Overall AROM Comments: arm flexion to 120 degrees; elbow/wrist/grip wfl RUE PROM (degrees) Overall PROM Right Upper Extremity: Within functional limits for tasks performed RUE Strength RUE Overall Strength: Deficits RUE Overall Strength Comments: arm flexion 3-/5, elbow flexion 4/5, elbow extension 3-/5, grip 4/5 RUE Tone RUE Tone: Hypertonic;Modified Ashworth Hypertonic Details: elbow flexors Modified Ashworth Scale for Grading Hypertonia RUE: Slight increase in muscle tone, manifested by a catch, followed by minimal resistance throughout the remainder (less than half) of the ROM LUE Assessment LUE Assessment: Within Functional Limits   See Function Navigator for Current Functional Status.  Salome Spotted 05/22/2015, 1:49 PM

## 2015-05-22 NOTE — Progress Notes (Signed)
Patient ID: Anne Herrera, female   DOB: 1936/01/14, 79 y.o.   MRN: [id number] Subjective/Complaints: Working with OT this am , appreciate SW note No pain c/os Reviewed ortho BPs  ROS- freq urination, , no pain c/os, no swallowing c/os, no SOB or CP  Objective: Vital Signs: Blood pressure 134/64, pulse 72, temperature 98.2 F (36.8 C), temperature source Oral, resp. rate 17, height 5' 4"  (1.626 m), weight 82.918 kg (182 lb 12.8 oz), SpO2 98 %. No results found. Results for orders placed or performed during the hospital encounter of 05/06/15 (from the past 72 hour(s))  Glucose, capillary     Status: None   Collection Time: 05/19/15 11:48 AM  Result Value Ref Range   Glucose-Capillary 92 65 - 99 mg/dL  Glucose, capillary     Status: None   Collection Time: 05/19/15  5:02 PM  Result Value Ref Range   Glucose-Capillary 71 65 - 99 mg/dL  Glucose, capillary     Status: Abnormal   Collection Time: 05/19/15  9:04 PM  Result Value Ref Range   Glucose-Capillary 109 (H) 65 - 99 mg/dL  Glucose, capillary     Status: Abnormal   Collection Time: 05/20/15  6:55 AM  Result Value Ref Range   Glucose-Capillary 112 (H) 65 - 99 mg/dL  Glucose, capillary     Status: None   Collection Time: 05/20/15 12:10 PM  Result Value Ref Range   Glucose-Capillary 68 65 - 99 mg/dL  Glucose, capillary     Status: Abnormal   Collection Time: 05/20/15 12:54 PM  Result Value Ref Range   Glucose-Capillary 113 (H) 65 - 99 mg/dL  Glucose, capillary     Status: None   Collection Time: 05/20/15  4:41 PM  Result Value Ref Range   Glucose-Capillary 90 65 - 99 mg/dL  Glucose, capillary     Status: Abnormal   Collection Time: 05/20/15  8:33 PM  Result Value Ref Range   Glucose-Capillary 133 (H) 65 - 99 mg/dL  Basic metabolic panel     Status: Abnormal   Collection Time: 05/21/15  3:45 AM  Result Value Ref Range   Sodium 131 (L) 135 - 145 mmol/L   Potassium 3.0 (L) 3.5 - 5.1 mmol/L   Chloride 96 (L) 101 - 111  mmol/L   CO2 27 22 - 32 mmol/L   Glucose, Bld 114 (H) 65 - 99 mg/dL   BUN 15 6 - 20 mg/dL   Creatinine, Ser 0.88 0.44 - 1.00 mg/dL   Calcium 9.0 8.9 - 10.3 mg/dL   GFR calc non Af Amer >60 >60 mL/min   GFR calc Af Amer >60 >60 mL/min    Comment: (NOTE) The eGFR has been calculated using the CKD EPI equation. This calculation has not been validated in all clinical situations. eGFR's persistently <60 mL/min signify possible Chronic Kidney Disease.    Anion gap 8 5 - 15  Glucose, capillary     Status: Abnormal   Collection Time: 05/21/15  6:54 AM  Result Value Ref Range   Glucose-Capillary 129 (H) 65 - 99 mg/dL  Glucose, capillary     Status: None   Collection Time: 05/21/15 11:24 AM  Result Value Ref Range   Glucose-Capillary 81 65 - 99 mg/dL  Glucose, capillary     Status: None   Collection Time: 05/21/15  4:53 PM  Result Value Ref Range   Glucose-Capillary 99 65 - 99 mg/dL  Glucose, capillary     Status: None  Collection Time: 05/21/15  9:01 PM  Result Value Ref Range   Glucose-Capillary 92 65 - 99 mg/dL  Glucose, capillary     Status: Abnormal   Collection Time: 05/22/15  6:43 AM  Result Value Ref Range   Glucose-Capillary 112 (H) 65 - 99 mg/dL     HEENT: normal and no strabismus Cardio: RRR and no murmur Resp: CTA B/L GI: BS positive and NT, ND Extremity:  No Edema Skin:   Other psoriatic patch bilateral pre patellar, less dry Neuro: Alert/Oriented, Cranial Nerve Abnormalities right central 7, Abnormal Sensory  R side, Abnormal Motor 3- R delt , bi, tri, grip,4- HF, KE,3- ADF, Abnormal FMC Ataxic/ dec FMC and Dysarthric Musc/Skel:  Other mild Right elbow flexion contracture, 5th dig flex contracture Gen NAD   Assessment/Plan: 1. 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 Physiatrist is providing close team  supervision and 24 hour management of active medical problems listed below. Physiatrist and rehab team continue to assess barriers to discharge/monitor patient progress toward functional and medical goals.  FIM: Function - Bathing Position: Shower Body parts bathed by patient: Right arm, Left arm, Chest, Abdomen, Front perineal area, Right upper leg, Left upper leg, Right lower leg, Left lower leg Body parts bathed by helper: Buttocks, Back Bathing not applicable: Buttocks, Left lower leg, Right lower leg, Right arm Assist Level: Touching or steadying assistance(Pt > 75%)  Function- Upper Body Dressing/Undressing What is the patient wearing?: Bra, Pull over shirt/dress Bra - Perfomed by patient: Thread/unthread right bra strap, Thread/unthread left bra strap Bra - Perfomed by helper: Hook/unhook bra (pull down sports bra) Pull over shirt/dress - Perfomed by patient: Put head through opening, Thread/unthread right sleeve, Thread/unthread left sleeve, Pull shirt over trunk Pull over shirt/dress - Perfomed by helper: Pull shirt over trunk Assist Level: Set up Set up : To obtain clothing/put away Function - Lower Body Dressing/Undressing What is the patient wearing?: Underwear, Pants, Socks, Shoes, AFO Position: Wheelchair/chair at sink Underwear - Performed by patient: Thread/unthread right underwear leg, Thread/unthread left underwear leg Underwear - Performed by helper: Pull underwear up/down Pants- Performed by patient: Thread/unthread right pants leg, Thread/unthread left pants leg, Pull pants up/down, Fasten/unfasten pants Pants- Performed by helper: Pull pants up/down Socks - Performed by patient: Don/doff right sock, Don/doff left sock Socks - Performed by helper: Don/doff right sock, Don/doff left sock Shoes - Performed by patient: Don/doff left shoe, Fasten left Shoes - Performed by helper: Don/doff right shoe, Fasten right AFO - Performed by patient: Don/doff right AFO AFO -  Performed by helper: Don/doff right AFO Assist Level: Touching or steadying assistance (Pt > 75%)  Function - Toileting Toileting steps completed by patient: Adjust clothing prior to toileting, Performs perineal hygiene, Adjust clothing after toileting Toileting steps completed by helper: Adjust clothing prior to toileting, Adjust clothing after toileting Toileting Assistive Devices: Grab bar or rail Assist level: Touching or steadying assistance (Pt.75%)  Function - Air cabin crew transfer assistive device: Grab bar, Environmental manager lift: Stedy Assist level to toilet: Supervision or verbal cues Assist level from toilet: Supervision or verbal cues Assist level to bedside commode (at bedside): Touching or steadying assistance (Pt > 75%) Assist level from bedside commode (at bedside): Touching or steadying assistance (Pt > 75%)  Function - Chair/bed transfer Chair/bed transfer method: Ambulatory Chair/bed transfer assist level: Supervision or verbal cues Chair/bed transfer assistive  device: Armrests, Walker, Orthosis Chair/bed transfer details: Verbal cues for sequencing, Verbal cues for technique, Verbal cues for precautions/safety  Function - Locomotion: Wheelchair Will patient use wheelchair at discharge?: Yes Type: Manual Max wheelchair distance: 150' Assist Level: Supervision or verbal cues Assist Level: Supervision or verbal cues Wheel 150 feet activity did not occur: Safety/medical concerns (low endurance - unable to complete) Assist Level: Supervision or verbal cues Turns around,maneuvers to table,bed, and toilet,negotiates 3% grade,maneuvers on rugs and over doorsills: No Function - Locomotion: Ambulation Assistive device: Walker-rolling, Orthosis Max distance: 200' Assist level: Supervision or verbal cues Assist level: Supervision or verbal cues Walk 50 feet with 2 turns activity did not occur: N/A Assist level: Supervision or verbal cues Walk 150 feet  activity did not occur: Safety/medical concerns (low endurance - unable) Assist level: Supervision or verbal cues Walk 10 feet on uneven surfaces activity did not occur: Safety/medical concerns (R LE too unstable - unsafe) Assist level: Touching or steadying assistance (Pt > 75%)  Function - Comprehension Comprehension: Auditory Comprehension assist level: Understands complex 90% of the time/cues 10% of the time  Function - Expression Expression: Verbal Expression assist level: Expresses complex 90% of the time/cues < 10% of the time  Function - Social Interaction Social Interaction assist level: Interacts appropriately with others with medication or extra time (anti-anxiety, antidepressant).  Function - Problem Solving Problem solving assist level: Solves basic 90% of the time/requires cueing < 10% of the time  Function - Memory Memory assist level: Recognizes or recalls 90% of the time/requires cueing < 10% of the time Patient normally able to recall (first 3 days only): Current season, Location of own room, Staff names and faces, That he or she is in a hospital  Medical Problem List and Plan: 1. 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 DVT Prophylaxis/Anticoagulation: Pharmaceutical: Lovenox 3. Pain Management: Tylenol prn pain. 4. Mood: LCSW to follow for evaluation and support.  5. Neuropsych: This patient is capable of making decisions on her own behalf. 6. Skin/Wound Care: Routine pressure relief measures.  7. Fluids/Electrolytes/Nutrition: Monitor I/O.Eats 100% meals . Improved fluid intake 8. HTN: Monitor bid. Continue coreg bid.Back on low dose chlothalidone. BP 134/64 , , No orthostatic may d/c ortho BPs, restarted cozaar, 9. DM type 2: Monitor BS ac/hs.   Continue glipizide and metformin.  glucose 112 this am, control good 10. H/o Gout: Controlled off medications. Monitor for joint pain and swelling 11.  Psoriasis, local tx, has triamcinolone cream ordered 12.  Neurogenic bladder trial ditropan improved nocturia 13.  Hypokalemia likely related to resuming Chlorthalidone, supplement LOS (Days) 16 A FACE TO FACE EVALUATION WAS PERFORMED  KIRSTEINS,ANDREW E 05/22/2015, 7:36 AM

## 2015-05-22 NOTE — Progress Notes (Signed)
Patient discharged to [facility] in [place]. Patient transported by car with packet and belongings. Report called to RN at [facility].

## 2015-05-22 NOTE — Progress Notes (Signed)
Social Work  Discharge Note  The overall goal for the admission was met for:   Discharge location: No-[facility]  Length of Stay: Yes-16 DAYS  Discharge activity level: Yes-MIN LEVEL  Home/community participation: Yes  Services provided included: MD, RD, PT, OT, SLP, RN, CM, TR, Pharmacy, Neuropsych and SW  Financial Services: Private Insurance: [employer]  Follow-up services arranged: Other: NHP  Comments (or additional information):PT NEEDS TO BE A HIGHER LEVEL TO RETURN HOME OR MAY NEED TO GO TO ALF FROM SNF. FRIEND TO TRANSPORT PT TO [facility]  Patient/Family verbalized understanding of follow-up arrangements: Yes  Individual responsible for coordination of the follow-up plan: PATIENT & NATASHA-DAUGHTER  Confirmed correct DME delivered: Elease Hashimoto 05/22/2015    Elease Hashimoto

## 2015-05-22 NOTE — Progress Notes (Signed)
Patient placed on CPAP auto 20 max and 6 min. No O2 bleed in needed. Patient tolerating well sat 98%. RT will continue to monitor as needed.

## 2015-05-23 DIAGNOSIS — E876 Hypokalemia: Secondary | ICD-10-CM | POA: Diagnosis present

## 2015-05-23 DIAGNOSIS — E871 Hypo-osmolality and hyponatremia: Secondary | ICD-10-CM | POA: Diagnosis present

## 2015-05-23 LAB — GLUCOSE, CAPILLARY: Glucose-Capillary: 84 mg/dL (ref 65–99)

## 2015-05-24 LAB — GLUCOSE, CAPILLARY
GLUCOSE-CAPILLARY: 106 mg/dL — AB (ref 65–99)
GLUCOSE-CAPILLARY: 96 mg/dL (ref 65–99)
Glucose-Capillary: 110 mg/dL — ABNORMAL HIGH (ref 65–99)
Glucose-Capillary: 52 mg/dL — ABNORMAL LOW (ref 65–99)
Glucose-Capillary: 96 mg/dL (ref 65–99)

## 2015-05-25 DIAGNOSIS — E871 Hypo-osmolality and hyponatremia: Secondary | ICD-10-CM | POA: Diagnosis not present

## 2015-05-25 LAB — GLUCOSE, CAPILLARY
GLUCOSE-CAPILLARY: 87 mg/dL (ref 65–99)
GLUCOSE-CAPILLARY: 100 mg/dL — AB (ref 65–99)
Glucose-Capillary: 109 mg/dL — ABNORMAL HIGH (ref 65–99)
Glucose-Capillary: 79 mg/dL (ref 65–99)

## 2015-05-26 DIAGNOSIS — E871 Hypo-osmolality and hyponatremia: Secondary | ICD-10-CM | POA: Diagnosis not present

## 2015-05-26 LAB — GLUCOSE, CAPILLARY
GLUCOSE-CAPILLARY: 63 mg/dL — AB (ref 65–99)
GLUCOSE-CAPILLARY: 108 mg/dL — AB (ref 65–99)
Glucose-Capillary: 119 mg/dL — ABNORMAL HIGH (ref 65–99)

## 2015-05-27 DIAGNOSIS — E871 Hypo-osmolality and hyponatremia: Secondary | ICD-10-CM | POA: Diagnosis not present

## 2015-05-27 LAB — GLUCOSE, CAPILLARY
Glucose-Capillary: 103 mg/dL — ABNORMAL HIGH (ref 65–99)
Glucose-Capillary: 106 mg/dL — ABNORMAL HIGH (ref 65–99)

## 2015-05-28 DIAGNOSIS — E871 Hypo-osmolality and hyponatremia: Secondary | ICD-10-CM | POA: Diagnosis not present

## 2015-05-28 LAB — COMPREHENSIVE METABOLIC PANEL
ALBUMIN: 3.6 g/dL (ref 3.5–5.0)
ALK PHOS: 76 U/L (ref 38–126)
ALT: 18 U/L (ref 14–54)
ANION GAP: 8 (ref 5–15)
AST: 19 U/L (ref 15–41)
BILIRUBIN TOTAL: 0.6 mg/dL (ref 0.3–1.2)
BUN: 18 mg/dL (ref 6–20)
CALCIUM: 8.7 mg/dL — AB (ref 8.9–10.3)
CO2: 25 mmol/L (ref 22–32)
Chloride: 99 mmol/L — ABNORMAL LOW (ref 101–111)
Creatinine, Ser: 0.96 mg/dL (ref 0.44–1.00)
GFR, EST NON AFRICAN AMERICAN: 56 mL/min — AB (ref >60)
Glucose, Bld: 96 mg/dL (ref 65–99)
POTASSIUM: 3.4 mmol/L — AB (ref 3.5–5.1)
Sodium: 132 mmol/L — ABNORMAL LOW (ref 135–145)
TOTAL PROTEIN: 6.7 g/dL (ref 6.5–8.1)

## 2015-05-28 LAB — CBC WITH DIFFERENTIAL/PLATELET
BASOS PCT: 1 %
Basophils Absolute: 0.1 10*3/uL (ref 0–0.1)
Eosinophils Absolute: 0.3 10*3/uL (ref 0–0.7)
Eosinophils Relative: 4 %
HEMATOCRIT: 31.7 % — AB (ref 35.0–47.0)
HEMOGLOBIN: 10 g/dL — AB (ref 12.0–16.0)
LYMPHS ABS: 1.7 10*3/uL (ref 1.0–3.6)
Lymphocytes Relative: 24 %
MCH: 22.6 pg — AB (ref 26.0–34.0)
MCHC: 31.6 g/dL — AB (ref 32.0–36.0)
MCV: 71.5 fL — ABNORMAL LOW (ref 80.0–100.0)
MONO ABS: 0.8 10*3/uL (ref 0.2–0.9)
MONOS PCT: 11 %
NEUTROS ABS: 4.2 10*3/uL (ref 1.4–6.5)
NEUTROS PCT: 60 %
Platelets: 251 10*3/uL (ref 150–440)
RBC: 4.43 MIL/uL (ref 3.80–5.20)
RDW: 15.6 % — AB (ref 11.5–14.5)
WBC: 7 10*3/uL (ref 3.6–11.0)

## 2015-05-28 LAB — GLUCOSE, CAPILLARY
Glucose-Capillary: 86 mg/dL (ref 65–99)
Glucose-Capillary: 105 mg/dL — ABNORMAL HIGH (ref 65–99)

## 2015-05-29 DIAGNOSIS — E871 Hypo-osmolality and hyponatremia: Secondary | ICD-10-CM | POA: Diagnosis not present

## 2015-05-29 LAB — GLUCOSE, CAPILLARY
GLUCOSE-CAPILLARY: 63 mg/dL — AB (ref 65–99)
GLUCOSE-CAPILLARY: 154 mg/dL — AB (ref 65–99)
GLUCOSE-CAPILLARY: 102 mg/dL — AB (ref 65–99)
GLUCOSE-CAPILLARY: 96 mg/dL (ref 65–99)
Glucose-Capillary: 94 mg/dL (ref 65–99)
Glucose-Capillary: 112 mg/dL — ABNORMAL HIGH (ref 65–99)
Glucose-Capillary: 89 mg/dL (ref 65–99)
Glucose-Capillary: 138 mg/dL — ABNORMAL HIGH (ref 65–99)

## 2015-05-30 ENCOUNTER — Encounter
Admission: RE | Admit: 2015-05-30 | Discharge: 2015-05-30 | Disposition: A | Discharge status: Home / Self Care | Payer: Medicare PPO | Source: Ambulatory Visit | Attending: Internal Medicine | Admitting: Internal Medicine

## 2015-05-30 DIAGNOSIS — E876 Hypokalemia: Secondary | ICD-10-CM | POA: Diagnosis present

## 2015-05-30 DIAGNOSIS — Z794 Long term (current) use of insulin: Secondary | ICD-10-CM | POA: Diagnosis not present

## 2015-05-30 DIAGNOSIS — E871 Hypo-osmolality and hyponatremia: Secondary | ICD-10-CM | POA: Insufficient documentation

## 2015-05-30 DIAGNOSIS — E119 Type 2 diabetes mellitus without complications: Secondary | ICD-10-CM | POA: Diagnosis not present

## 2015-05-30 LAB — GLUCOSE, CAPILLARY
GLUCOSE-CAPILLARY: 111 mg/dL — AB (ref 65–99)
Glucose-Capillary: 105 mg/dL — ABNORMAL HIGH (ref 65–99)

## 2015-05-31 DIAGNOSIS — E876 Hypokalemia: Secondary | ICD-10-CM | POA: Diagnosis not present

## 2015-05-31 LAB — GLUCOSE, CAPILLARY
GLUCOSE-CAPILLARY: 91 mg/dL (ref 65–99)
GLUCOSE-CAPILLARY: 155 mg/dL — AB (ref 65–99)

## 2015-06-01 DIAGNOSIS — E876 Hypokalemia: Secondary | ICD-10-CM | POA: Diagnosis not present

## 2015-06-02 DIAGNOSIS — E876 Hypokalemia: Secondary | ICD-10-CM | POA: Diagnosis not present

## 2015-06-02 LAB — GLUCOSE, CAPILLARY
GLUCOSE-CAPILLARY: 99 mg/dL (ref 65–99)
GLUCOSE-CAPILLARY: 137 mg/dL — AB (ref 65–99)

## 2015-06-03 LAB — GLUCOSE, CAPILLARY
GLUCOSE-CAPILLARY: 95 mg/dL (ref 65–99)
GLUCOSE-CAPILLARY: 140 mg/dL — AB (ref 65–99)
Glucose-Capillary: 93 mg/dL (ref 65–99)

## 2015-06-04 DIAGNOSIS — E876 Hypokalemia: Secondary | ICD-10-CM | POA: Diagnosis not present

## 2015-06-04 LAB — COMPREHENSIVE METABOLIC PANEL
ALBUMIN: 3.9 g/dL (ref 3.5–5.0)
ALK PHOS: 80 U/L (ref 38–126)
ALT: 18 U/L (ref 14–54)
AST: 22 U/L (ref 15–41)
Anion gap: 10 (ref 5–15)
BILIRUBIN TOTAL: 0.5 mg/dL (ref 0.3–1.2)
BUN: 15 mg/dL (ref 6–20)
CALCIUM: 9.2 mg/dL (ref 8.9–10.3)
CO2: 26 mmol/L (ref 22–32)
CREATININE: 0.89 mg/dL (ref 0.44–1.00)
Chloride: 96 mmol/L — ABNORMAL LOW (ref 101–111)
GFR calc Af Amer: >60 mL/min (ref >60)
GLUCOSE: 158 mg/dL — AB (ref 65–99)
POTASSIUM: 3.5 mmol/L (ref 3.5–5.1)
Sodium: 132 mmol/L — ABNORMAL LOW (ref 135–145)
TOTAL PROTEIN: 7.6 g/dL (ref 6.5–8.1)

## 2015-06-04 LAB — CBC WITH DIFFERENTIAL/PLATELET
BASOS ABS: 0.1 10*3/uL (ref 0–0.1)
Basophils Relative: 1 %
EOS ABS: 0.1 10*3/uL (ref 0–0.7)
EOS PCT: 2 %
HCT: 34.9 % — ABNORMAL LOW (ref 35.0–47.0)
Hemoglobin: 10.7 g/dL — ABNORMAL LOW (ref 12.0–16.0)
LYMPHS PCT: 23 %
Lymphs Abs: 1.6 10*3/uL (ref 1.0–3.6)
MCH: 22 pg — AB (ref 26.0–34.0)
MCHC: 30.8 g/dL — ABNORMAL LOW (ref 32.0–36.0)
MCV: 71.4 fL — AB (ref 80.0–100.0)
MONO ABS: 0.6 10*3/uL (ref 0.2–0.9)
Monocytes Relative: 9 %
Neutro Abs: 4.5 10*3/uL (ref 1.4–6.5)
Neutrophils Relative %: 65 %
PLATELETS: 291 10*3/uL (ref 150–440)
RBC: 4.89 MIL/uL (ref 3.80–5.20)
RDW: 16 % — AB (ref 11.5–14.5)
WBC: 6.9 10*3/uL (ref 3.6–11.0)

## 2015-06-04 LAB — GLUCOSE, CAPILLARY: GLUCOSE-CAPILLARY: 138 mg/dL — AB (ref 65–99)

## 2015-06-05 DIAGNOSIS — E876 Hypokalemia: Secondary | ICD-10-CM | POA: Diagnosis not present

## 2015-06-07 LAB — GLUCOSE, CAPILLARY
GLUCOSE-CAPILLARY: 94 mg/dL (ref 65–99)
GLUCOSE-CAPILLARY: 140 mg/dL — AB (ref 65–99)
GLUCOSE-CAPILLARY: 109 mg/dL — AB (ref 65–99)

## 2016-05-30 ENCOUNTER — Other Ambulatory Visit: Payer: Self-pay | Admitting: Family Medicine

## 2016-05-30 DIAGNOSIS — Z1231 Encounter for screening mammogram for malignant neoplasm of breast: Secondary | ICD-10-CM

## 2016-06-09 ENCOUNTER — Ambulatory Visit
Admission: RE | Admit: 2016-06-09 | Discharge: 2016-06-09 | Disposition: A | Discharge status: Home / Self Care | Payer: Medicare PPO | Source: Ambulatory Visit | Attending: Family Medicine | Admitting: Family Medicine

## 2016-06-09 DIAGNOSIS — Z1231 Encounter for screening mammogram for malignant neoplasm of breast: Secondary | ICD-10-CM | POA: Diagnosis present

## 2016-06-14 ENCOUNTER — Other Ambulatory Visit: Payer: Self-pay | Admitting: Family Medicine

## 2016-06-14 DIAGNOSIS — N6489 Other specified disorders of breast: Secondary | ICD-10-CM

## 2016-07-01 ENCOUNTER — Ambulatory Visit
Admission: RE | Admit: 2016-07-01 | Discharge: 2016-07-01 | Disposition: A | Discharge status: Home / Self Care | Payer: Medicare PPO | Source: Ambulatory Visit | Attending: Family Medicine | Admitting: Family Medicine

## 2016-07-01 DIAGNOSIS — N6489 Other specified disorders of breast: Secondary | ICD-10-CM

## 2016-08-30 ENCOUNTER — Ambulatory Visit (INDEPENDENT_AMBULATORY_CARE_PROVIDER_SITE_OTHER): Payer: Medicare PPO | Admitting: Podiatry

## 2016-08-30 DIAGNOSIS — B351 Tinea unguium: Secondary | ICD-10-CM | POA: Diagnosis not present

## 2016-08-30 DIAGNOSIS — L603 Nail dystrophy: Secondary | ICD-10-CM

## 2016-08-30 DIAGNOSIS — M79609 Pain in unspecified limb: Secondary | ICD-10-CM | POA: Diagnosis not present

## 2016-08-30 DIAGNOSIS — L608 Other nail disorders: Secondary | ICD-10-CM | POA: Diagnosis not present

## 2016-08-30 DIAGNOSIS — E0843 Diabetes mellitus due to underlying condition with diabetic autonomic (poly)neuropathy: Secondary | ICD-10-CM | POA: Diagnosis not present

## 2016-08-30 NOTE — Progress Notes (Signed)
SUBJECTIVE Patient with a history of diabetes mellitus presents to office today complaining of splitting and cracking to her right great toenail. Pain while ambulating in shoes. Patient is unable to trim their own nails.   Allergies  Allergen Reactions  . Amlodipine Hives and Itching  . Lisinopril Swelling    Reaction:  Mouth swelling  . Sulfa Antibiotics     OBJECTIVE General Patient is awake, alert, and oriented x 3 and in no acute distress. Derm Skin is dry and supple bilateral. Negative open lesions or macerations. Remaining integument unremarkable. Nails are tender, long, thickened and dystrophic with subungual debris, consistent with onychomycosis, 1-5 bilateral. No signs of infection noted. Vasc  DP and PT pedal pulses palpable bilaterally. Temperature gradient within normal limits.  Neuro Epicritic and protective threshold sensation diminished bilaterally.  Musculoskeletal Exam No symptomatic pedal deformities noted bilateral. Muscular strength within normal limits.  ASSESSMENT 1. Diabetes Mellitus w/ peripheral neuropathy 2. Onychomycosis of nail due to dermatophyte bilateral 3. Brittle nails bilateral 4. Nail splitting right great toenail  PLAN OF CARE 1. Patient evaluated today. 2. Instructed to maintain good pedal hygiene and foot care. Stressed importance of controlling blood sugar.  3. Mechanical debridement of right great toenail was performed using a nail nipper. Filed with dremel without incident.  4. Return to clinic when necessary     Felecia ShellingBrent M. Chaquana Nichols, DPM [facility]  Dr. Felecia ShellingBrent M. Geneveive Furness, DPM   [address]                                        [place], [place] [address]                Office [phone]  Fax [phone]

## 2016-10-10 ENCOUNTER — Ambulatory Visit: Admit: 2016-10-10 | Payer: Self-pay | Admitting: Gastroenterology

## 2016-10-10 SURGERY — COLONOSCOPY
Anesthesia: General

## 2016-12-26 ENCOUNTER — Ambulatory Visit: Payer: Self-pay | Admitting: Oncology

## 2017-01-06 ENCOUNTER — Encounter: Payer: Self-pay | Admitting: Gynecology

## 2017-01-06 ENCOUNTER — Ambulatory Visit
Admission: EM | Admit: 2017-01-06 | Discharge: 2017-01-06 | Disposition: A | Discharge status: Home / Self Care | Payer: Medicare PPO | Attending: Family Medicine | Admitting: Family Medicine

## 2017-01-06 DIAGNOSIS — I10 Essential (primary) hypertension: Secondary | ICD-10-CM | POA: Diagnosis not present

## 2017-01-06 DIAGNOSIS — R42 Dizziness and giddiness: Secondary | ICD-10-CM | POA: Diagnosis not present

## 2017-01-06 DIAGNOSIS — E876 Hypokalemia: Secondary | ICD-10-CM | POA: Diagnosis not present

## 2017-01-06 HISTORY — DX: Cerebral infarction, unspecified: I63.9

## 2017-01-06 LAB — BASIC METABOLIC PANEL WITH GFR
Anion gap: 10 (ref 5–15)
BUN: 19 mg/dL (ref 6–20)
CO2: 26 mmol/L (ref 22–32)
Calcium: 9.1 mg/dL (ref 8.9–10.3)
Chloride: 100 mmol/L — ABNORMAL LOW (ref 101–111)
Creatinine, Ser: 0.91 mg/dL (ref 0.44–1.00)
GFR calc Af Amer: >60 mL/min
GFR calc non Af Amer: 59 mL/min — ABNORMAL LOW
Glucose, Bld: 85 mg/dL (ref 65–99)
Potassium: 3 mmol/L — ABNORMAL LOW (ref 3.5–5.1)
Sodium: 136 mmol/L (ref 135–145)

## 2017-01-06 MED ORDER — POTASSIUM CHLORIDE CRYS ER 20 MEQ PO TBCR
20.0000 meq | EXTENDED_RELEASE_TABLET | Freq: Once | ORAL | Status: AC
  Administered 2017-01-06: 20 meq via ORAL

## 2017-01-06 NOTE — ED Triage Notes (Signed)
Per patient was seen by her primary care today for her high blood pressure. Per patient bp reading was 148/84. Pt. Stated was told by the nurse to come over her. Patient stated her blood pressure reading at home x this week is around 160/80.

## 2017-01-06 NOTE — ED Provider Notes (Signed)
[facility]    CSN: [id number] Arrival date & time: 01/06/17  1603     History   Chief Complaint Chief Complaint  Patient presents with  . Hypertension    HPI Erine Phenix is a 81 y.o. female.   81 yo female with a c/o 2-3 days h/o intermittent dizziness and elevated blood pressures. Denies any chest pains, shortness of breath, numbness/tingling, fevers, chills. Patient has multiple chronic medical problems and takes multiple medications.    The history is provided by the patient.    Past Medical History:  Diagnosis Date  . Diabetes mellitus without complication (HCC)   . Gout   . High cholesterol   . Hypertension   . Stroke [facility])    2016    Patient Active Problem List   Diagnosis Date Noted  . Diabetes (HCC) 05/20/2015  . HTN (hypertension) 05/20/2015  . Spastic hemiplegia affecting right dominant side (HCC) 05/07/2015  . 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 Lacunar stroke of left subthalamic region (HCC) 05/06/2015  . TIA (transient ischemic attack) 04/28/2015    Past Surgical History:  Procedure Laterality Date  . TUBAL LIGATION      OB History    No data available       Home Medications    Prior to Admission medications   Medication Sig Start Date End Date Taking? Authorizing Provider  acetaminophen (TYLENOL) 325 MG tablet Take 1-2 tablets (325-650 mg total) by mouth every 4 (four) hours as needed for mild pain. 05/22/15  Yes Love, Evlyn Kanner, PA-C  alum & mag hydroxide-simeth (MAALOX/MYLANTA) 200-200-20 MG/5ML suspension Take 30 mLs by mouth every 4 (four) hours as needed for indigestion. 05/22/15  Yes Love, Evlyn Kanner, PA-C  aspirin EC 81 MG EC tablet Take 1 tablet (81 mg total) by mouth daily. 05/22/15  Yes Love, Evlyn Kanner, PA-C  atorvastatin (LIPITOR) 80 MG tablet Take 1 tablet (80 mg total) by mouth daily at 6 PM. 05/22/15  Yes Love, Evlyn Kanner, PA-C  carvedilol (COREG) 12.5 MG tablet Take 1 tablet (12.5 mg total) by mouth 2 (two) times  daily with a meal. 05/22/15  Yes Love, Evlyn Kanner, PA-C  chlorthalidone (HYGROTON) 25 MG tablet Take 25 mg by mouth daily.   Yes [provider]  glipiZIDE (GLUCOTROL XL) 2.5 MG 24 hr tablet Take 2.5 mg by mouth daily.   Yes [provider]  IRON PO Take by mouth.   Yes [provider]  Lactase 9000 UNITS CHEW Chew 1 tablet (9,000 Units total) by mouth 3 (three) times daily with meals. 05/22/15  Yes Love, Evlyn Kanner, PA-C  loratadine (CLARITIN) 10 MG tablet Take 1 tablet (10 mg total) by mouth daily. 05/22/15  Yes Love, Evlyn Kanner, PA-C  losartan (COZAAR) 25 MG tablet Take 1 tablet (25 mg total) by mouth daily. 05/22/15  Yes Love, Evlyn Kanner, PA-C  metFORMIN (GLUCOPHAGE-XR) 500 MG 24 hr tablet Take 1 tablet (500 mg total) by mouth 2 (two) times daily with a meal. 05/22/15  Yes Love, Evlyn Kanner, PA-C  polycarbophil (FIBERCON) 625 MG tablet Take 1 tablet (625 mg total) by mouth 2 (two) times daily. 05/22/15  Yes Love, Evlyn Kanner, PA-C  potassium chloride (MICRO-K) 10 MEQ CR capsule Take 20 mEq by mouth daily.   Yes [provider]  senna-docusate (SENOKOT-S) 8.6-50 MG per tablet Take 1 tablet by mouth at bedtime as needed for mild constipation. 05/22/15  Yes Love, Evlyn Kanner, PA-C  FLUoxetine (PROZAC) 20 MG  capsule Take 1 capsule (20 mg total) by mouth daily. 05/22/15   Love, Evlyn Kanner, PA-C  oxybutynin (DITROPAN) 5 MG tablet Take 0.5 tablets (2.5 mg total) by mouth at bedtime and may repeat dose one time if needed. 05/22/15   Love, Evlyn Kanner, PA-C  traZODone (DESYREL) 50 MG tablet Take 0.5-1 tablets (25-50 mg total) by mouth at bedtime as needed for sleep. Patient not taking: Reported on 08/30/2016 05/22/15   Jacquelynn Cree, PA-C  triamcinolone ointment (KENALOG) 0.1 % Apply topically 2 (two) times daily. Patient not taking: Reported on 08/30/2016 05/22/15   Jacquelynn Cree, PA-C    Family History Family History  Problem Relation Age of Onset  . Hypertension Unknown        all family  members  . CAD Mother   . CAD Brother   . Breast cancer Cousin        mat and pat cousins    Social History Social History  Substance Use Topics  . Smoking status: Never Smoker  . Smokeless tobacco: Never Used  . Alcohol use No     Allergies   Amlodipine; Lisinopril; and Sulfa antibiotics   Review of Systems Review of Systems   Physical Exam Triage Vital Signs ED Triage Vitals  Enc Vitals Group     BP 01/06/17 1636 (!) 164/73     Pulse Rate 01/06/17 1636 60     Resp 01/06/17 1636 16     Temp 01/06/17 1636 98.5 F (36.9 C)     Temp Source 01/06/17 1636 Oral     SpO2 01/06/17 1636 98 %     Weight 01/06/17 1637 180 lb (81.6 kg)     Height 01/06/17 1637 5\' 4"  (1.626 m)     Head Circumference --      Peak Flow --      Pain Score --      Pain Loc --      Pain Edu? --      Excl. in GC? --    No data found.   Updated Vital Signs BP (!) 148/78 (BP Location: Left Arm)   Pulse 60   Temp 98.5 F (36.9 C) (Oral)   Resp 16   Ht 5\' 4"  (1.626 m)   Wt 180 lb (81.6 kg)   SpO2 98%   BMI 30.90 kg/m   Visual Acuity Right Eye Distance:   Left Eye Distance:   Bilateral Distance:    Right Eye Near:   Left Eye Near:    Bilateral Near:     Physical Exam  Constitutional: She is oriented to person, place, and time. She appears well-developed and well-nourished. No distress.  HENT:  Head: Normocephalic and atraumatic.  Right Ear: Tympanic membrane, external ear and ear canal normal.  Left Ear: Tympanic membrane, external ear and ear canal normal.  Nose: Rhinorrhea present. No mucosal edema, nose lacerations, sinus tenderness, nasal deformity, septal deviation or nasal septal hematoma. No epistaxis.  No foreign bodies. Right sinus exhibits no maxillary sinus tenderness and no frontal sinus tenderness. Left sinus exhibits no maxillary sinus tenderness and no frontal sinus tenderness.  Mouth/Throat: Uvula is midline, oropharynx is clear and moist and mucous membranes are  normal. No oropharyngeal exudate.  Eyes: Conjunctivae and EOM are normal. Pupils are equal, round, and reactive to light. Right eye exhibits no discharge. Left eye exhibits no discharge. No scleral icterus.  Neck: Normal range of motion. Neck supple. No thyromegaly present.  Cardiovascular: Normal rate, regular  rhythm and normal heart sounds.   Pulmonary/Chest: Effort normal and breath sounds normal. No respiratory distress. She has no wheezes. She has no rales.  Lymphadenopathy:    She has no cervical adenopathy.  Neurological: She is alert and oriented to person, place, and time. She displays normal reflexes. No cranial nerve deficit or sensory deficit. She exhibits normal muscle tone. Coordination normal.  nonfocal   Skin: She is not diaphoretic.  Nursing note and vitals reviewed.    UC Treatments / Results  Labs (all labs ordered are listed, but only abnormal results are displayed) Labs Reviewed  BASIC METABOLIC PANEL - Abnormal; Notable for the following:       Result Value   Potassium 3.0 (*)    Chloride 100 (*)    GFR calc non Af Amer 59 (*)    All other components within normal limits    EKG  EKG Interpretation None       Radiology No results found.  Procedures Procedures (including critical care time)  Medications Ordered in UC Medications  potassium chloride SA (K-DUR,KLOR-CON) CR tablet 20 mEq (20 mEq Oral Given 01/06/17 1808)     Initial Impression / Assessment and Plan / UC Course  I have reviewed the triage vital signs and the nursing notes.  Pertinent labs & imaging results that were available during my care of the patient were reviewed by me and considered in my medical decision making (see chart for details).       Final Clinical Impressions(s) / UC Diagnoses   Final diagnoses:  Dizziness  Hypokalemia  Essential hypertension    New Prescriptions Discharge Medication List as of 01/06/2017  6:08 PM     1. Lab results and diagnosis  reviewed with patient 2. Patient given kcl 20 meg po x1  3. Recommend supportive treatment with increase in potassium rich foods 4. Continue current home medications 5. Follow up with PCP on Monday (3 days)  6. Follow-up prn if symptoms worsen or don't improve   Payton Mccallumonty, Ilynn Stauffer, MD 01/06/17 2139

## 2017-01-06 NOTE — Discharge Instructions (Signed)
Follow up on Monday with Primary Care Provider for recheck potassium

## 2017-01-09 ENCOUNTER — Inpatient Hospital Stay: Payer: Medicare PPO

## 2017-01-09 ENCOUNTER — Encounter: Payer: Self-pay | Admitting: Oncology

## 2017-01-09 ENCOUNTER — Inpatient Hospital Stay: Payer: Medicare PPO | Attending: Oncology | Admitting: Oncology

## 2017-01-09 VITALS — BP 119/67 | HR 66 | Temp 98.6°F | Resp 18 | Ht 64.0 in | Wt 180.8 lb

## 2017-01-09 DIAGNOSIS — E119 Type 2 diabetes mellitus without complications: Secondary | ICD-10-CM | POA: Insufficient documentation

## 2017-01-09 DIAGNOSIS — Z79899 Other long term (current) drug therapy: Secondary | ICD-10-CM | POA: Insufficient documentation

## 2017-01-09 DIAGNOSIS — Z7984 Long term (current) use of oral hypoglycemic drugs: Secondary | ICD-10-CM | POA: Insufficient documentation

## 2017-01-09 DIAGNOSIS — I10 Essential (primary) hypertension: Secondary | ICD-10-CM | POA: Insufficient documentation

## 2017-01-09 DIAGNOSIS — Z87891 Personal history of nicotine dependence: Secondary | ICD-10-CM | POA: Insufficient documentation

## 2017-01-09 DIAGNOSIS — D509 Iron deficiency anemia, unspecified: Secondary | ICD-10-CM | POA: Insufficient documentation

## 2017-01-09 DIAGNOSIS — R471 Dysarthria and anarthria: Secondary | ICD-10-CM | POA: Insufficient documentation

## 2017-01-09 DIAGNOSIS — Z7982 Long term (current) use of aspirin: Secondary | ICD-10-CM | POA: Diagnosis not present

## 2017-01-09 DIAGNOSIS — E7800 Pure hypercholesterolemia, unspecified: Secondary | ICD-10-CM | POA: Diagnosis not present

## 2017-01-09 DIAGNOSIS — Z8673 Personal history of transient ischemic attack (TIA), and cerebral infarction without residual deficits: Secondary | ICD-10-CM | POA: Insufficient documentation

## 2017-01-09 DIAGNOSIS — M109 Gout, unspecified: Secondary | ICD-10-CM | POA: Insufficient documentation

## 2017-01-09 LAB — CBC WITH DIFFERENTIAL/PLATELET
Basophils Absolute: 0.1 10*3/uL (ref 0–0.1)
Basophils Relative: 1 %
EOS PCT: 4 %
Eosinophils Absolute: 0.3 10*3/uL (ref 0–0.7)
HCT: 34 % — ABNORMAL LOW (ref 35.0–47.0)
Hemoglobin: 10.7 g/dL — ABNORMAL LOW (ref 12.0–16.0)
LYMPHS ABS: 2.5 10*3/uL (ref 1.0–3.6)
LYMPHS PCT: 36 %
MCH: 22.3 pg — AB (ref 26.0–34.0)
MCHC: 31.6 g/dL — ABNORMAL LOW (ref 32.0–36.0)
MCV: 70.5 fL — AB (ref 80.0–100.0)
Monocytes Absolute: 0.6 10*3/uL (ref 0.2–0.9)
Monocytes Relative: 9 %
Neutro Abs: 3.5 10*3/uL (ref 1.4–6.5)
Neutrophils Relative %: 50 %
PLATELETS: 282 10*3/uL (ref 150–440)
RBC: 4.82 MIL/uL (ref 3.80–5.20)
RDW: 16.9 % — ABNORMAL HIGH (ref 11.5–14.5)
WBC: 7.1 10*3/uL (ref 3.6–11.0)

## 2017-01-09 LAB — URINALYSIS, COMPLETE (UACMP) WITH MICROSCOPIC
Bacteria, UA: NONE SEEN
Bilirubin Urine: NEGATIVE
GLUCOSE, UA: NEGATIVE mg/dL
Ketones, ur: NEGATIVE mg/dL
NITRITE: NEGATIVE
PH: 6.5 (ref 5.0–8.0)
Protein, ur: NEGATIVE mg/dL
SPECIFIC GRAVITY, URINE: 1.015 (ref 1.005–1.030)

## 2017-01-09 LAB — RETICULOCYTES
RBC.: 4.79 MIL/uL (ref 3.80–5.20)
RETIC CT PCT: 1 % (ref 0.4–3.1)
Retic Count, Absolute: 47.9 10*3/uL (ref 19.0–183.0)

## 2017-01-09 LAB — FERRITIN: Ferritin: 68 ng/mL (ref 11–307)

## 2017-01-09 LAB — IRON AND TIBC
Iron: 29 ug/dL (ref 28–170)
SATURATION RATIOS: 9 % — AB (ref 10.4–31.8)
TIBC: 307 ug/dL (ref 250–450)
UIBC: 280 ug/dL

## 2017-01-09 LAB — VITAMIN B12: Vitamin B-12: 249 pg/mL (ref 180–914)

## 2017-01-09 LAB — FOLATE: FOLATE: 18.7 ng/mL (ref >5.9)

## 2017-01-09 NOTE — Progress Notes (Signed)
Pt here for anemia, tired but still does all of the normal activities of the day. She does have constipation from the one iron pill a day and she has been taking the iron pills for years she states

## 2017-01-09 NOTE — Progress Notes (Signed)
Hematology/Oncology Consult note [facility] Telephone:([phone]) [phone] [phone]  Patient Care Team: Leim FabryAldridge, Barbara, MD as PCP - General (Family Medicine)   Name of the patient: Anne Herrera  [id number]  11/11/1935    Reason for referral- iron deficiency anemia   Referring physician- Dr. Leim FabryBarbara Aldridge  Date of visit: 01/09/17   History of presenting illness- patient is a 81 year old female who was been referred to us for evaluation and management of iron deficiency anemia. Recent CBC from 12/08/2016 showed white count of 6.7, H&H of 10.3/32.9 and a platelet count of 301 with an MCV of 72. Iron studies showed low serum iron of 24. TIBC was normal at 304. And transferrin saturation was low at 8% patient has been on oral iron for quite some time now but has not been able to tolerate it. Her last colonoscopy was in 2005 and that was incomplete because of an inflammation of the hepatic flexure. Biopsies were taken at that time which were nonspecific. She was supposed to have repeat colonoscopy in 10 yrs but had  A stroke in 2005 and hence did not get it done. She did not follow-up with GI until December 2017. At that time the plan was to consider EGD if she were to develop iron deficiency.  Reports mild fatigue. Denies other complaints  ECOG PS- 1  Pain scale- 0   Review of systems- Review of Systems  Constitutional: Positive for malaise/fatigue. Negative for chills, fever and weight loss.  HENT: Negative for congestion, ear discharge and nosebleeds.   Eyes: Negative for blurred vision.  Respiratory: Negative for cough, hemoptysis, sputum production, shortness of breath and wheezing.   Cardiovascular: Negative for chest pain, palpitations, orthopnea and claudication.  Gastrointestinal: Negative for abdominal pain, blood in stool, constipation, diarrhea, heartburn, melena, nausea and vomiting.  Genitourinary: Negative for dysuria, flank pain, frequency,  hematuria and urgency.  Musculoskeletal: Negative for back pain, joint pain and myalgias.  Skin: Negative for rash.  Neurological: Negative for dizziness, tingling, focal weakness, seizures, weakness and headaches.  Endo/Heme/Allergies: Does not bruise/bleed easily.  Psychiatric/Behavioral: Negative for depression and suicidal ideas. The patient does not have insomnia.     Allergies  Allergen Reactions  . Amlodipine Hives and Itching  . Lisinopril Swelling    Reaction:  Mouth swelling  . Sulfa Antibiotics     Patient Active Problem List   Diagnosis Date Noted  . Iron deficiency anemia 01/09/2017  . Diabetes (HCC) 05/20/2015  . HTN (hypertension) 05/20/2015  . Spastic hemiplegia affecting right dominant side (HCC) 05/07/2015  . 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 Lacunar stroke of left subthalamic region (HCC) 05/06/2015  . TIA (transient ischemic attack) 04/28/2015     Past Medical History:  Diagnosis Date  . Diabetes mellitus without complication (HCC)   . Gout   . High cholesterol   . Hypertension   . Stroke (HCC)    2016  . Stroke [facility](HCC)      Past Surgical History:  Procedure Laterality Date  . TUBAL LIGATION      Social History   Social History  . Marital status: Divorced    Spouse name: N/A  . Number of children: N/A  . Years of education: N/A   Occupational History  . Not on file.   Social History Main Topics  . Smoking status: Former Smoker    Quit date: 09/29/1973  . Smokeless tobacco: Never Used  . Alcohol use No  . Drug use: No  . Sexual  activity: Not on file   Other Topics Concern  . Not on file   Social History Narrative  . No narrative on file     Family History  Problem Relation Age of Onset  . Hypertension Unknown        all family members  . CAD Mother   . CAD Brother   . Breast cancer Cousin        mat and pat cousins  . Anemia Father   . Anemia Son   . Anemia Daughter      Current Outpatient Prescriptions:  .   acetaminophen (TYLENOL) 325 MG tablet, Take 1-2 tablets (325-650 mg total) by mouth every 4 (four) hours as needed for mild pain., Disp: , Rfl:  .  alum & mag hydroxide-simeth (MAALOX/MYLANTA) 200-200-20 MG/5ML suspension, Take 30 mLs by mouth every 4 (four) hours as needed for indigestion., Disp: 355 mL, Rfl: 0 .  aspirin EC 81 MG EC tablet, Take 1 tablet (81 mg total) by mouth daily., Disp: , Rfl:  .  atorvastatin (LIPITOR) 80 MG tablet, Take 1 tablet (80 mg total) by mouth daily at 6 PM., Disp: , Rfl:  .  carvedilol (COREG) 12.5 MG tablet, Take 1 tablet (12.5 mg total) by mouth 2 (two) times daily with a meal., Disp: , Rfl:  .  chlorthalidone (HYGROTON) 25 MG tablet, Take 25 mg by mouth daily., Disp: , Rfl:  .  cloNIDine (CATAPRES) 0.1 MG tablet, Take 0.1 mg by mouth 2 (two) times daily., Disp: , Rfl:  .  glipiZIDE (GLUCOTROL XL) 2.5 MG 24 hr tablet, Take 2.5 mg by mouth daily., Disp: , Rfl:  .  IRON PO, Take 1 Dose by mouth daily. , Disp: , Rfl:  .  Lactase 9000 UNITS CHEW, Chew 1 tablet (9,000 Units total) by mouth 3 (three) times daily with meals., Disp: , Rfl: 0 .  loratadine (CLARITIN) 10 MG tablet, Take 1 tablet (10 mg total) by mouth daily., Disp: , Rfl:  .  losartan (COZAAR) 100 MG tablet, Take 100 mg by mouth daily., Disp: , Rfl:  .  metFORMIN (GLUCOPHAGE-XR) 500 MG 24 hr tablet, Take 1 tablet (500 mg total) by mouth 2 (two) times daily with a meal., Disp: , Rfl:  .  polycarbophil (FIBERCON) 625 MG tablet, Take 1 tablet (625 mg total) by mouth 2 (two) times daily. (Patient taking differently: Take 625 mg by mouth as needed. ), Disp: , Rfl:  .  potassium chloride (MICRO-K) 10 MEQ CR capsule, Take 20 mEq by mouth daily., Disp: , Rfl:  .  triamcinolone ointment (KENALOG) 0.1 %, Apply topically 2 (two) times daily., Disp: 30 g, Rfl: 0   Physical exam:  Vitals:   01/09/17 1021  BP: 119/67  Pulse: 66  Resp: 18  Temp: 98.6 F (37 C)  TempSrc: Tympanic  Weight: 180 lb 12.4 oz (82 kg)    Height: 5\' 4"  (1.626 m)   Physical Exam  Constitutional: She is oriented to person, place, and time and well-developed, well-nourished, and in no distress.  Ambulates with a cane  HENT:  Head: Normocephalic and atraumatic.  Eyes: EOM are normal. Pupils are equal, round, and reactive to light.  Neck: Normal range of motion.  Cardiovascular: Normal rate, regular rhythm and normal heart sounds.   Pulmonary/Chest: Effort normal and breath sounds normal.  Abdominal: Soft. Bowel sounds are normal.  Neurological: She is alert and oriented to person, place, and time.  Mild right sided hemiparesis from prior  stroke  Skin: Skin is warm and dry.       CMP Latest Ref Rng & Units 01/06/2017  Glucose 65 - 99 mg/dL 85  BUN 6 - 20 mg/dL 19  Creatinine 8.11 - 9.14 mg/dL 7.82  Sodium 956 - 213 mmol/L 136  Potassium 3.5 - 5.1 mmol/L 3.0(L)  Chloride 101 - 111 mmol/L 100(L)  CO2 22 - 32 mmol/L 26  Calcium 8.9 - 10.3 mg/dL 9.1  Total Protein 6.5 - 8.1 g/dL -  Total Bilirubin 0.3 - 1.2 mg/dL -  Alkaline Phos 38 - 086 U/L -  AST 15 - 41 U/L -  ALT 14 - 54 U/L -   CBC Latest Ref Rng & Units 06/04/2015  WBC 3.6 - 11.0 K/uL 6.9  Hemoglobin 12.0 - 16.0 g/dL 10.7(L)  Hematocrit 35.0 - 47.0 % 34.9(L)  Platelets 150 - 440 K/uL 291    Assessment and plan- Patient is a 81 y.o. female referred to me for iron deficiency anemia  Iron deficiency anemia- today I will check a repeat CBC, ferritin and iron studies, B12, folate, reticulocyte count and haptoglobin. I will also check urinalysis, stool H. pylori antigen and celiac disease panel. Patient has been on oral iron and has not been able to tolerate it. I will therefore proceed with IV iron. I will plan to give her ferriheme 510 mg IV 2 doses once a week. I discussed the risks and benefits of IV iron including all but not limited to headache, fatigue, risk of infusion reaction. Patient understands and agrees to proceed as planned.  I will repeat her iron  studies about 8 weeks from now. I will also encourage her to speak to [facility] clinic GI about possible EGD. She declines colonoscopy   Thank you for this kind referral and the opportunity to participate in the care of this patient   Visit Diagnosis 1. Iron deficiency anemia, unspecified iron deficiency anemia type     Dr. Owens Shark, MD, MPH [facility] Pager- [phone] 01/09/2017

## 2017-01-10 LAB — HAPTOGLOBIN: Haptoglobin: 229 mg/dL — ABNORMAL HIGH (ref 34–200)

## 2017-01-11 ENCOUNTER — Inpatient Hospital Stay: Payer: Medicare PPO

## 2017-01-11 DIAGNOSIS — D509 Iron deficiency anemia, unspecified: Secondary | ICD-10-CM | POA: Diagnosis not present

## 2017-01-11 LAB — CELIAC DISEASE PANEL
Endomysial Ab, IgA: NEGATIVE
IGA: 262 mg/dL (ref 64–422)
Tissue Transglutaminase Ab, IgA: <2 U/mL (ref 0–3)

## 2017-01-12 ENCOUNTER — Encounter: Payer: Self-pay | Admitting: Family Medicine

## 2017-01-12 LAB — H. PYLORI ANTIGEN, STOOL: H. PYLORI STOOL AG, EIA: NEGATIVE

## 2017-01-16 ENCOUNTER — Inpatient Hospital Stay: Payer: Medicare PPO

## 2017-01-16 VITALS — BP 134/72 | HR 65 | Temp 97.8°F | Resp 18

## 2017-01-16 DIAGNOSIS — D509 Iron deficiency anemia, unspecified: Secondary | ICD-10-CM | POA: Diagnosis not present

## 2017-01-16 MED ORDER — SODIUM CHLORIDE 0.9 % IV SOLN
510.0000 mg | Freq: Once | INTRAVENOUS | Status: AC
  Administered 2017-01-16: 510 mg via INTRAVENOUS
  Filled 2017-01-16: qty 17

## 2017-01-16 MED ORDER — SODIUM CHLORIDE 0.9 % IV SOLN
Freq: Once | INTRAVENOUS | Status: AC
  Administered 2017-01-16: 11:00:00 via INTRAVENOUS
  Filled 2017-01-16: qty 1000

## 2017-01-24 ENCOUNTER — Inpatient Hospital Stay: Payer: Medicare PPO

## 2017-01-24 VITALS — BP 160/70 | HR 64 | Temp 97.4°F | Resp 18

## 2017-01-24 DIAGNOSIS — D509 Iron deficiency anemia, unspecified: Secondary | ICD-10-CM | POA: Diagnosis not present

## 2017-01-24 MED ORDER — SODIUM CHLORIDE 0.9 % IV SOLN
510.0000 mg | Freq: Once | INTRAVENOUS | Status: AC
  Administered 2017-01-24: 510 mg via INTRAVENOUS
  Filled 2017-01-24: qty 17

## 2017-01-24 MED ORDER — SODIUM CHLORIDE 0.9 % IV SOLN
Freq: Once | INTRAVENOUS | Status: AC
  Administered 2017-01-24: 14:00:00 via INTRAVENOUS
  Filled 2017-01-24: qty 1000

## 2017-01-24 NOTE — Patient Instructions (Signed)

## 2017-03-13 ENCOUNTER — Inpatient Hospital Stay: Payer: Medicare PPO | Attending: Oncology | Admitting: Oncology

## 2017-03-13 ENCOUNTER — Inpatient Hospital Stay: Payer: Medicare PPO

## 2017-03-13 ENCOUNTER — Encounter: Payer: Self-pay | Admitting: Oncology

## 2017-03-13 VITALS — BP 126/68 | HR 66 | Temp 97.3°F | Resp 18 | Wt 181.9 lb

## 2017-03-13 DIAGNOSIS — Z8673 Personal history of transient ischemic attack (TIA), and cerebral infarction without residual deficits: Secondary | ICD-10-CM | POA: Diagnosis not present

## 2017-03-13 DIAGNOSIS — D509 Iron deficiency anemia, unspecified: Secondary | ICD-10-CM | POA: Diagnosis not present

## 2017-03-13 DIAGNOSIS — E7800 Pure hypercholesterolemia, unspecified: Secondary | ICD-10-CM | POA: Insufficient documentation

## 2017-03-13 DIAGNOSIS — M109 Gout, unspecified: Secondary | ICD-10-CM | POA: Diagnosis not present

## 2017-03-13 DIAGNOSIS — E119 Type 2 diabetes mellitus without complications: Secondary | ICD-10-CM | POA: Insufficient documentation

## 2017-03-13 DIAGNOSIS — I10 Essential (primary) hypertension: Secondary | ICD-10-CM | POA: Insufficient documentation

## 2017-03-13 DIAGNOSIS — Z7982 Long term (current) use of aspirin: Secondary | ICD-10-CM | POA: Diagnosis not present

## 2017-03-13 DIAGNOSIS — Z79899 Other long term (current) drug therapy: Secondary | ICD-10-CM | POA: Diagnosis not present

## 2017-03-13 DIAGNOSIS — Z7984 Long term (current) use of oral hypoglycemic drugs: Secondary | ICD-10-CM | POA: Diagnosis not present

## 2017-03-13 DIAGNOSIS — Z8249 Family history of ischemic heart disease and other diseases of the circulatory system: Secondary | ICD-10-CM | POA: Insufficient documentation

## 2017-03-13 DIAGNOSIS — Z87891 Personal history of nicotine dependence: Secondary | ICD-10-CM | POA: Diagnosis not present

## 2017-03-13 LAB — CBC
HCT: 37.4 % (ref 35.0–47.0)
Hemoglobin: 11.8 g/dL — ABNORMAL LOW (ref 12.0–16.0)
MCH: 24 pg — ABNORMAL LOW (ref 26.0–34.0)
MCHC: 31.7 g/dL — ABNORMAL LOW (ref 32.0–36.0)
MCV: 75.7 fL — ABNORMAL LOW (ref 80.0–100.0)
PLATELETS: 254 10*3/uL (ref 150–440)
RBC: 4.94 MIL/uL (ref 3.80–5.20)
RDW: 21.9 % — AB (ref 11.5–14.5)
WBC: 7.2 10*3/uL (ref 3.6–11.0)

## 2017-03-13 LAB — IRON AND TIBC
Iron: 53 ug/dL (ref 28–170)
Saturation Ratios: 20 % (ref 10.4–31.8)
TIBC: 260 ug/dL (ref 250–450)
UIBC: 207 ug/dL

## 2017-03-13 LAB — FERRITIN: Ferritin: 460 ng/mL — ABNORMAL HIGH (ref 11–307)

## 2017-03-13 NOTE — Progress Notes (Signed)
Here for follow up

## 2017-03-13 NOTE — Progress Notes (Signed)
Hematology/Oncology Consult note [facility]  Telephone:([phone] Fax:([phone]  Patient Care Team: Leim Fabry, MD as PCP - General (Family Medicine)   Name of the patient: Anne Herrera  [id number]  [birth year]   Date of visit: 03/13/17  Diagnosis- iron deficiency anemia  Chief complaint/ Reason for visit- routine f/u  Heme/Onc history: patient is a 81 year old female who was been referred to [place] for evaluation and management of iron deficiency anemia. Recent CBC from 12/08/2016 showed white count of 6.7, H&H of 10.3/32.9 and a platelet count of 301 with an MCV of 72. Iron studies showed low serum iron of 24. TIBC was normal at 304. And transferrin saturation was low at 8% patient has been on oral iron for quite some time now but has not been able to tolerate it. Her last colonoscopy was in 2005 and that was incomplete because of an inflammation of the hepatic flexure. Biopsies were taken at that time which were nonspecific. She was supposed to have repeat colonoscopy in 10 yrs but had  A stroke in 2005 and hence did not get it done. She did not follow-up with GI until December 2017. At that time the plan was to consider EGD if she were to develop iron deficiency  Results of blood work from 01/09/2017 were as follows: CBC showed white count of 7.1, H&H of 10.7/34 with an MCV of 78.5 and a platelet count of 282. Ferritin levels were normal at 68. Iron studies showed a low iron saturation of 9%. Serum iron was low normal at 29. B12 folate and haptoglobin was normal. Stool H. pylori antigen was negative. Urinalysis did not reveal any hematuria. Reticulocyte count was low at 1%. Celiac disease panel was negative.    Interval history- doing well. Denies any complaints  ECOG PS- 0 Pain scale- 0   Review of systems- Review of Systems  Constitutional: Negative for chills, fever, malaise/fatigue and weight loss.  HENT: Negative for congestion, ear  discharge and nosebleeds.   Eyes: Negative for blurred vision.  Respiratory: Negative for cough, hemoptysis, sputum production, shortness of breath and wheezing.   Cardiovascular: Negative for chest pain, palpitations, orthopnea and claudication.  Gastrointestinal: Negative for abdominal pain, blood in stool, constipation, diarrhea, heartburn, melena, nausea and vomiting.  Genitourinary: Negative for dysuria, flank pain, frequency, hematuria and urgency.  Musculoskeletal: Negative for back pain, joint pain and myalgias.  Skin: Negative for rash.  Neurological: Negative for dizziness, tingling, focal weakness, seizures, weakness and headaches.  Endo/Heme/Allergies: Does not bruise/bleed easily.  Psychiatric/Behavioral: Negative for depression and suicidal ideas. The patient does not have insomnia.        Allergies  Allergen Reactions  . Amlodipine Hives and Itching  . Lisinopril Swelling    Reaction:  Mouth swelling  . Sulfa Antibiotics      Past Medical History:  Diagnosis Date  . Diabetes mellitus without complication (HCC)   . Gout   . High cholesterol   . Hypertension   . Stroke (HCC)    2016  . Stroke [facility])      Past Surgical History:  Procedure Laterality Date  . TUBAL LIGATION      Social History   Social History  . Marital status: Divorced    Spouse name: N/A  . Number of children: N/A  . Years of education: N/A   Occupational History  . Not on file.   Social History Main Topics  . Smoking status: Former Smoker  Quit date: 09/29/1973  . Smokeless tobacco: Never Used  . Alcohol use No  . Drug use: No  . Sexual activity: Not on file   Other Topics Concern  . Not on file   Social History Narrative  . No narrative on file    Family History  Problem Relation Age of Onset  . Hypertension Unknown        all family members  . CAD Mother   . CAD Brother   . Breast cancer Cousin        mat and pat cousins  . Anemia Father   . Anemia Son   .  Anemia Daughter      Current Outpatient Prescriptions:  .  acetaminophen (TYLENOL) 325 MG tablet, Take 1-2 tablets (325-650 mg total) by mouth every 4 (four) hours as needed for mild pain., Disp: , Rfl:  .  alum & mag hydroxide-simeth (MAALOX/MYLANTA) 200-200-20 MG/5ML suspension, Take 30 mLs by mouth every 4 (four) hours as needed for indigestion., Disp: 355 mL, Rfl: 0 .  aspirin EC 81 MG EC tablet, Take 1 tablet (81 mg total) by mouth daily., Disp: , Rfl:  .  atorvastatin (LIPITOR) 80 MG tablet, Take 1 tablet (80 mg total) by mouth daily at 6 PM., Disp: , Rfl:  .  carvedilol (COREG) 12.5 MG tablet, Take 1 tablet (12.5 mg total) by mouth 2 (two) times daily with a meal., Disp: , Rfl:  .  chlorthalidone (HYGROTON) 25 MG tablet, Take 25 mg by mouth daily., Disp: , Rfl:  .  cloNIDine (CATAPRES) 0.1 MG tablet, Take 0.1 mg by mouth 2 (two) times daily., Disp: , Rfl:  .  glipiZIDE (GLUCOTROL XL) 2.5 MG 24 hr tablet, Take 2.5 mg by mouth daily., Disp: , Rfl:  .  IRON PO, Take 1 Dose by mouth daily. , Disp: , Rfl:  .  Lactase 9000 UNITS CHEW, Chew 1 tablet (9,000 Units total) by mouth 3 (three) times daily with meals., Disp: , Rfl: 0 .  loratadine (CLARITIN) 10 MG tablet, Take 1 tablet (10 mg total) by mouth daily., Disp: , Rfl:  .  losartan (COZAAR) 100 MG tablet, Take 100 mg by mouth daily., Disp: , Rfl:  .  metFORMIN (GLUCOPHAGE-XR) 500 MG 24 hr tablet, Take 1 tablet (500 mg total) by mouth 2 (two) times daily with a meal., Disp: , Rfl:  .  polycarbophil (FIBERCON) 625 MG tablet, Take 1 tablet (625 mg total) by mouth 2 (two) times daily. (Patient taking differently: Take 625 mg by mouth as needed. ), Disp: , Rfl:  .  potassium chloride (MICRO-K) 10 MEQ CR capsule, Take 20 mEq by mouth daily., Disp: , Rfl:  .  triamcinolone ointment (KENALOG) 0.1 %, Apply topically 2 (two) times daily., Disp: 30 g, Rfl: 0  Physical exam:  Vitals:   03/13/17 1025  BP: 126/68  Pulse: 66  Resp: 18  Temp: (!) 97.3  F (36.3 C)  TempSrc: Tympanic  Weight: 181 lb 14.1 oz (82.5 kg)   Physical Exam  Constitutional: She is oriented to person, place, and time and well-developed, well-nourished, and in no distress.  HENT:  Head: Normocephalic and atraumatic.  Eyes: Pupils are equal, round, and reactive to light. EOM are normal.  Neck: Normal range of motion.  Cardiovascular: Normal rate, regular rhythm and normal heart sounds.   Pulmonary/Chest: Effort normal and breath sounds normal.  Abdominal: Soft. Bowel sounds are normal.  Neurological: She is alert and oriented to person, place,  and time.  Skin: Skin is warm and dry.     CMP Latest Ref Rng & Units 01/06/2017  Glucose 65 - 99 mg/dL 85  BUN 6 - 20 mg/dL 19  Creatinine 8.110.44 - 9.141.00 mg/dL 7.820.91  Sodium 956135 - 213145 mmol/L 136  Potassium 3.5 - 5.1 mmol/L 3.0(L)  Chloride 101 - 111 mmol/L 100(L)  CO2 22 - 32 mmol/L 26  Calcium 8.9 - 10.3 mg/dL 9.1  Total Protein 6.5 - 8.1 g/dL -  Total Bilirubin 0.3 - 1.2 mg/dL -  Alkaline Phos 38 - 086126 U/L -  AST 15 - 41 U/L -  ALT 14 - 54 U/L -   CBC Latest Ref Rng & Units 03/13/2017  WBC 3.6 - 11.0 K/uL 7.2  Hemoglobin 12.0 - 16.0 g/dL 11.8(L)  Hematocrit 35.0 - 47.0 % 37.4  Platelets 150 - 440 K/uL 254     Assessment and plan- Patient is a 81 y.o. female with iron deficiency anemia of uncertain etiology  H/H improved after 2 doses of feraheme. Iron studies from today pending. Will consider more doses based on todays results. Repeat cbc in 3 months and I will see her back in 6 months with cbc, ferritin and iron studies   Visit Diagnosis 1. Iron deficiency anemia, unspecified iron deficiency anemia type      Dr. Owens SharkArchana Hillari Zumwalt, MD, MPH [facility] Pager- [phone] 03/13/2017 12:09 PM

## 2017-04-06 ENCOUNTER — Other Ambulatory Visit: Payer: Self-pay | Admitting: Family Medicine

## 2017-04-06 DIAGNOSIS — Z780 Asymptomatic menopausal state: Secondary | ICD-10-CM

## 2017-04-06 DIAGNOSIS — E119 Type 2 diabetes mellitus without complications: Secondary | ICD-10-CM

## 2017-06-05 ENCOUNTER — Other Ambulatory Visit: Payer: Medicare PPO

## 2017-07-05 ENCOUNTER — Other Ambulatory Visit: Payer: Self-pay | Admitting: Family Medicine

## 2017-07-05 DIAGNOSIS — Z1231 Encounter for screening mammogram for malignant neoplasm of breast: Secondary | ICD-10-CM

## 2017-07-14 ENCOUNTER — Ambulatory Visit
Admission: RE | Admit: 2017-07-14 | Discharge: 2017-07-14 | Disposition: A | Discharge status: Home / Self Care | Payer: Medicare PPO | Source: Ambulatory Visit | Attending: Family Medicine | Admitting: Family Medicine

## 2017-07-14 DIAGNOSIS — Z1231 Encounter for screening mammogram for malignant neoplasm of breast: Secondary | ICD-10-CM | POA: Diagnosis present

## 2017-09-11 ENCOUNTER — Inpatient Hospital Stay: Payer: Medicare PPO

## 2017-09-11 ENCOUNTER — Inpatient Hospital Stay: Payer: Medicare PPO | Admitting: Oncology

## 2018-10-26 ENCOUNTER — Encounter: Payer: Self-pay | Admitting: Podiatry

## 2018-10-26 ENCOUNTER — Ambulatory Visit: Payer: Medicare PPO | Admitting: Podiatry

## 2018-10-26 DIAGNOSIS — L600 Ingrowing nail: Secondary | ICD-10-CM | POA: Diagnosis not present

## 2018-10-26 DIAGNOSIS — L603 Nail dystrophy: Secondary | ICD-10-CM

## 2018-10-26 MED ORDER — GENTAMICIN SULFATE 0.1 % EX CREA: 1.0000 "application " | TOPICAL_CREAM | Freq: Two times a day (BID) | CUTANEOUS | 0 refills | Status: DC

## 2018-10-26 NOTE — Patient Instructions (Signed)

## 2018-10-30 NOTE — Progress Notes (Signed)
   Subjective: Patient presents today for evaluation of pain to the right great toenail that began a few weeks ago. She states the nail is splitting. She denies modifying factors and has not done anything for treatment. Patient presents today for further treatment and evaluation.  Past Medical History:  Diagnosis Date  . Diabetes mellitus without complication (HCC)   . Gout   . High cholesterol   . Hypertension   . Stroke (HCC)    2016  . Stroke [facility])     Objective:  General: Well developed, nourished, in no acute distress, alert and oriented x3   Dermatology: Skin is warm, dry and supple bilateral. Splitting nail noted to the medial border of the right great toenail. Pain on palpation noted to the border of the nail fold. The remaining nails appear unremarkable at this time. There are no open sores, lesions.  Vascular: Dorsalis Pedis artery and Posterior Tibial artery pedal pulses palpable. No lower extremity edema noted.   Neruologic: Grossly intact via light touch bilateral.  Musculoskeletal: Muscular strength within normal limits in all groups bilateral. Normal range of motion noted to all pedal and ankle joints.   Assesement: #1 Ingrown / splitting nail medial border right hallux  Plan of Care:  1. Patient evaluated.  2. Discussed treatment alternatives and plan of care. Explained nail avulsion procedure and post procedure course to patient. 3. Patient opted for permanent partial nail avulsion of the medial border of the right great toenail.  4. Prior to procedure, local anesthesia infiltration utilized using 3 ml of a 50:50 mixture of 2% plain lidocaine and 0.5% plain marcaine in a normal hallux block fashion and a betadine prep performed.  5. Partial permanent nail avulsion with chemical matrixectomy performed using 3x30sec applications of phenol followed by alcohol flush.  6. Light dressing applied. 7. Prescription for Gentamicin cream provided to patient to use daily with  a bandage.  8. Return to clinic in 2 weeks.   Felecia Shelling, DPM [facility]  Dr. Felecia Shelling, DPM    [address]                                        [place], [place] [address]                Office [phone]  Fax [phone]

## 2018-11-16 ENCOUNTER — Ambulatory Visit: Payer: Medicare PPO | Admitting: Podiatry

## 2018-11-23 ENCOUNTER — Ambulatory Visit: Payer: Medicare PPO | Admitting: Podiatry

## 2019-05-01 ENCOUNTER — Other Ambulatory Visit: Payer: Self-pay | Admitting: Family Medicine

## 2019-05-01 DIAGNOSIS — Z1231 Encounter for screening mammogram for malignant neoplasm of breast: Secondary | ICD-10-CM

## 2019-07-08 ENCOUNTER — Other Ambulatory Visit: Payer: Self-pay

## 2019-07-08 ENCOUNTER — Ambulatory Visit
Admission: RE | Admit: 2019-07-08 | Discharge: 2019-07-08 | Disposition: A | Discharge status: Home / Self Care | Payer: Medicare PPO | Source: Ambulatory Visit | Attending: Family Medicine | Admitting: Family Medicine

## 2019-07-08 ENCOUNTER — Encounter (INDEPENDENT_AMBULATORY_CARE_PROVIDER_SITE_OTHER): Payer: Self-pay

## 2019-07-08 DIAGNOSIS — Z1231 Encounter for screening mammogram for malignant neoplasm of breast: Secondary | ICD-10-CM | POA: Diagnosis not present

## 2019-10-07 ENCOUNTER — Ambulatory Visit: Payer: Medicare PPO | Attending: Internal Medicine

## 2019-10-07 ENCOUNTER — Other Ambulatory Visit: Payer: Self-pay

## 2019-10-07 DIAGNOSIS — Z23 Encounter for immunization: Secondary | ICD-10-CM

## 2019-10-07 NOTE — Progress Notes (Signed)
   Covid-19 Vaccination Clinic  Name:  Anne Herrera    MRN: [id number] DOB: 01-30-1936  10/07/2019  [name] was observed post Covid-19 immunization for 15 minutes without incidence. She was provided with Vaccine Information Sheet and instruction to access the V-Safe system.   [name] was instructed to call 911 with any severe reactions post vaccine: Marland Kitchen Difficulty breathing  . Swelling of your face and throat  . A fast heartbeat  . A bad rash all over your body  . Dizziness and weakness    Immunizations Administered    Name Date Dose VIS Date Route   Moderna COVID-19 Vaccine 10/07/2019  1:26 PM 0.5 mL 07/30/2019 Intramuscular   Manufacturer: Moderna   Lot: [id number]   NDC: 09906-893-40

## 2019-11-06 ENCOUNTER — Ambulatory Visit: Payer: Medicare PPO | Attending: Internal Medicine

## 2019-11-06 DIAGNOSIS — Z23 Encounter for immunization: Secondary | ICD-10-CM | POA: Insufficient documentation

## 2019-11-06 NOTE — Progress Notes (Signed)
   Covid-19 Vaccination Clinic  Name:  Silvina Hackleman    MRN: [id number] DOB: 1936/04/30  11/06/2019  Ms. Becvar was observed post Covid-19 immunization for 15 minutes without incident. She was provided with Vaccine Information Sheet and instruction to access the V-Safe system.   Ms. Castagnola was instructed to call 911 with any severe reactions post vaccine: Marland Kitchen Difficulty breathing  . Swelling of face and throat  . A fast heartbeat  . A bad rash all over body  . Dizziness and weakness   Immunizations Administered    Name Date Dose VIS Date Route   Moderna COVID-19 Vaccine 11/06/2019  1:00 PM 0.5 mL 07/30/2019 Intramuscular   Manufacturer: Moderna   Lot: [id number]   NDC: 80063-494-94

## 2020-03-23 ENCOUNTER — Other Ambulatory Visit: Payer: Self-pay | Admitting: Family Medicine

## 2020-03-23 DIAGNOSIS — Z1231 Encounter for screening mammogram for malignant neoplasm of breast: Secondary | ICD-10-CM

## 2020-08-26 ENCOUNTER — Other Ambulatory Visit: Payer: Self-pay | Admitting: Family Medicine

## 2020-08-26 DIAGNOSIS — Z780 Asymptomatic menopausal state: Secondary | ICD-10-CM

## 2020-08-26 DIAGNOSIS — Z1231 Encounter for screening mammogram for malignant neoplasm of breast: Secondary | ICD-10-CM

## 2020-08-31 ENCOUNTER — Other Ambulatory Visit: Payer: Self-pay

## 2020-08-31 ENCOUNTER — Ambulatory Visit
Admission: RE | Admit: 2020-08-31 | Discharge: 2020-08-31 | Disposition: A | Discharge status: Home / Self Care | Payer: Medicare PPO | Source: Ambulatory Visit | Attending: Family Medicine | Admitting: Family Medicine

## 2020-08-31 DIAGNOSIS — Z1231 Encounter for screening mammogram for malignant neoplasm of breast: Secondary | ICD-10-CM | POA: Insufficient documentation

## 2020-12-22 ENCOUNTER — Other Ambulatory Visit: Payer: Self-pay | Admitting: Family Medicine

## 2020-12-22 DIAGNOSIS — Z780 Asymptomatic menopausal state: Secondary | ICD-10-CM

## 2021-08-31 ENCOUNTER — Other Ambulatory Visit: Payer: Self-pay | Admitting: Nephrology

## 2021-08-31 DIAGNOSIS — N1832 Chronic kidney disease, stage 3b: Secondary | ICD-10-CM

## 2021-08-31 DIAGNOSIS — E1122 Type 2 diabetes mellitus with diabetic chronic kidney disease: Secondary | ICD-10-CM

## 2021-09-07 ENCOUNTER — Ambulatory Visit: Payer: Medicare PPO | Admitting: Podiatry

## 2021-09-07 ENCOUNTER — Other Ambulatory Visit: Payer: Self-pay

## 2021-09-07 ENCOUNTER — Encounter: Payer: Self-pay | Admitting: Podiatry

## 2021-09-07 DIAGNOSIS — L600 Ingrowing nail: Secondary | ICD-10-CM

## 2021-09-07 DIAGNOSIS — L989 Disorder of the skin and subcutaneous tissue, unspecified: Secondary | ICD-10-CM

## 2021-09-07 MED ORDER — GENTAMICIN SULFATE 0.1 % EX CREA: 1.0000 "application " | TOPICAL_CREAM | Freq: Two times a day (BID) | CUTANEOUS | 1 refills | Status: DC

## 2021-09-07 NOTE — Progress Notes (Signed)
Subjective: Patient presents today for evaluation of pain to the medial border of the right great toe.  Patient was seen in the office in February 2020 at which time a partial nail matricectomy was performed to the same offending border.  Patient believes that the chemical did not quite work last visit and the ingrown has recurred. Patient is concerned for possible ingrown nail.  It is very sensitive to touch.  Patient also complains of a symptomatic callus to the plantar aspect of the left foot.  She says it is very tender with pressure and walking.  Patient presents today for further treatment and evaluation.  Past Medical History:  Diagnosis Date   Diabetes mellitus without complication (HCC)    Gout    High cholesterol    Hypertension    Stroke (HCC)    2016   Stroke [facility])    Past Surgical History:  Procedure Laterality Date   TUBAL LIGATION     Allergies  Allergen Reactions   Acetaminophen Other (See Comments)    Excessive sedation at 500mg  dose   Amlodipine Hives, Itching and Other (See Comments)    Face breaks out.   Lisinopril Swelling    Reaction:  Mouth swelling   Sulfa Antibiotics     Objective:  General: Well developed, nourished, in no acute distress, alert and oriented x3   Dermatology: Skin is warm, dry and supple bilateral.  Medial border right great toe appears to be erythematous with evidence of an ingrowing nail. Pain on palpation noted to the border of the nail fold. The remaining nails appear unremarkable at this time.  There is also hyperkeratotic symptomatic preulcerative callus lesion to the plantar aspect of the left foot  Vascular: Dorsalis Pedis artery and Posterior Tibial artery pedal pulses palpable. No lower extremity edema noted.   Neruologic: Grossly intact via light touch bilateral.  Musculoskeletal: Muscular strength within normal limits in all groups bilateral. Normal range of motion noted to all pedal and ankle joints.    Assesement: #1  Recurrent paronychia with ingrowing nail medial border right great toe #2  Symptomatic preulcerative callus lesion plantar aspect of the left foot  Plan of Care:  1. Patient evaluated.  2. Discussed treatment alternatives and plan of care. Explained nail avulsion procedure and post procedure course to patient. 3. Patient opted for permanent partial nail avulsion of the ingrown portion of the nail.  4. Prior to procedure, local anesthesia infiltration utilized using 3 ml of a 50:50 mixture of 2% plain lidocaine and 0.5% plain marcaine in a normal hallux block fashion and a betadine prep performed.  5. Partial permanent nail avulsion with chemical matrixectomy performed using 3x30sec applications of phenol followed by alcohol flush.  6. Light dressing applied.  Post care instructions provided 7.  Prescription for gentamicin 2% cream  8.  Excisional debridement of the hyperkeratotic preulcerative callus lesion was performed using a 312 scalpel without incident or bleeding.  Patient felt relief. 9.  Return to clinic 2 weeks.  , DPM [facility]  Dr. Felecia Shelling, DPM    [address] [place]                Office [phone]  Fax [phone] ° ° ° ° °

## 2021-09-07 NOTE — Patient Instructions (Signed)

## 2021-09-08 ENCOUNTER — Ambulatory Visit
Admission: RE | Admit: 2021-09-08 | Discharge: 2021-09-08 | Disposition: A | Discharge status: Home / Self Care | Payer: Medicare PPO | Source: Ambulatory Visit | Attending: Nephrology | Admitting: Nephrology

## 2021-09-08 ENCOUNTER — Other Ambulatory Visit: Payer: Self-pay

## 2021-09-08 DIAGNOSIS — N1832 Chronic kidney disease, stage 3b: Secondary | ICD-10-CM | POA: Diagnosis present

## 2021-09-08 DIAGNOSIS — E1122 Type 2 diabetes mellitus with diabetic chronic kidney disease: Secondary | ICD-10-CM | POA: Insufficient documentation

## 2021-09-21 ENCOUNTER — Ambulatory Visit: Payer: Medicare PPO | Admitting: Podiatry

## 2021-09-21 ENCOUNTER — Other Ambulatory Visit: Payer: Self-pay

## 2021-09-21 ENCOUNTER — Encounter: Payer: Self-pay | Admitting: Podiatry

## 2021-09-21 DIAGNOSIS — L600 Ingrowing nail: Secondary | ICD-10-CM | POA: Diagnosis not present

## 2021-09-21 MED ORDER — DOXYCYCLINE HYCLATE 100 MG PO TABS: 100.0000 mg | ORAL_TABLET | Freq: Two times a day (BID) | ORAL | 0 refills | Status: DC

## 2021-09-21 NOTE — Progress Notes (Signed)
° °  Subjective: 86 y.o. female presents today status post permanent nail avulsion procedure of the medial border right great toe that was performed on 09/07/2021.  Patient states that she is doing well.  She continues to have some tenderness to the area.  Overall improvement but she has been noticing some drainage from the area.  She has been soaking her foot and applying antibiotic cream as instructed  Past Medical History:  Diagnosis Date   Diabetes mellitus without complication (HCC)    Gout    High cholesterol    Hypertension    Stroke (HCC)    2016   Stroke [facility])     Objective: Skin is warm, dry and supple. Nail and respective nail fold appears to be healing appropriately. Open wound to the associated nail fold with a granular wound base and moderate amount of fibrotic tissue. Minimal drainage noted. Mild erythema around the periungual region likely due to phenol chemical matricectomy.  Assessment: #1 s/p partial permanent nail matrixectomy medial border right great toe that was performed 09/07/2021   Plan of care: #1 patient was evaluated  #2 light debridement of open wound was performed to the periungual border of the respective toe using a currette. Antibiotic ointment and Band-Aid was applied. #3  Continue daily Epsom salt soaks with gentamicin cream  #4 prescription for doxycycline 100 mg 2 times daily #20  #5 patient is to return to clinic 2 weeks   Felecia Shelling, DPM [facility]  Dr. Felecia Shelling, DPM    [address] [place], [place] [address]                Office [phone]  Fax [phone]

## 2021-10-08 ENCOUNTER — Encounter: Payer: Self-pay | Admitting: Podiatry

## 2021-10-08 ENCOUNTER — Other Ambulatory Visit: Payer: Self-pay

## 2021-10-08 ENCOUNTER — Ambulatory Visit: Payer: Medicare PPO | Admitting: Podiatry

## 2021-10-08 DIAGNOSIS — L600 Ingrowing nail: Secondary | ICD-10-CM

## 2021-10-08 NOTE — Progress Notes (Signed)
° °  Subjective: 86 y.o. female presents today status post permanent nail avulsion procedure of the medial border right great toe that was performed on 09/07/2021.  Patient states that she is feeling much better.  She has been soaking her foot and applying antibiotic cream as instructed.  No new complaints at this time  Past Medical History:  Diagnosis Date   Diabetes mellitus without complication (HCC)    Gout    High cholesterol    Hypertension    Stroke (HCC)    2016   Stroke [facility])     Objective: Skin is warm, dry and supple. Nail and respective nail fold appears to be healing appropriately.  Well-healing nail matricectomy site.. Minimal drainage noted.  No erythema noted.  Assessment: #1 s/p partial permanent nail matrixectomy medial border right great toe   Plan of care: #1 patient was evaluated  #2 light debridement of the area was performed to the periungual border of the respective toe using a currette. Antibiotic ointment and Band-Aid was applied. #3 patient is to return to clinic on a PRN basis.   Felecia Shelling, DPM [facility]  Dr. Felecia Shelling, DPM    [address] [place], [place] [address]                Office [phone]  Fax [phone]

## 2021-11-15 ENCOUNTER — Other Ambulatory Visit: Payer: Self-pay | Admitting: Family Medicine

## 2021-11-15 DIAGNOSIS — Z1231 Encounter for screening mammogram for malignant neoplasm of breast: Secondary | ICD-10-CM

## 2021-11-22 ENCOUNTER — Ambulatory Visit
Admission: RE | Admit: 2021-11-22 | Discharge: 2021-11-22 | Disposition: A | Discharge status: Home / Self Care | Payer: Medicare PPO | Source: Ambulatory Visit | Attending: Family Medicine | Admitting: Family Medicine

## 2021-11-22 ENCOUNTER — Other Ambulatory Visit: Payer: Self-pay

## 2021-11-22 DIAGNOSIS — Z1231 Encounter for screening mammogram for malignant neoplasm of breast: Secondary | ICD-10-CM | POA: Insufficient documentation

## 2022-12-27 IMAGING — MG MM DIGITAL SCREENING BILAT W/ TOMO AND CAD
8 series · 8 of 24 positions shown · non-contrast
Comparison: Previous exam(s).

CLINICAL DATA: Screening.

EXAM:
DIGITAL SCREENING BILATERAL MAMMOGRAM WITH TOMOSYNTHESIS AND CAD
TECHNIQUE: Bilateral screening digital craniocaudal and mediolateral oblique
mammograms were obtained. Bilateral screening digital breast
tomosynthesis was performed. The images were evaluated with
computer-aided detection.

[R MLO synth-2D]
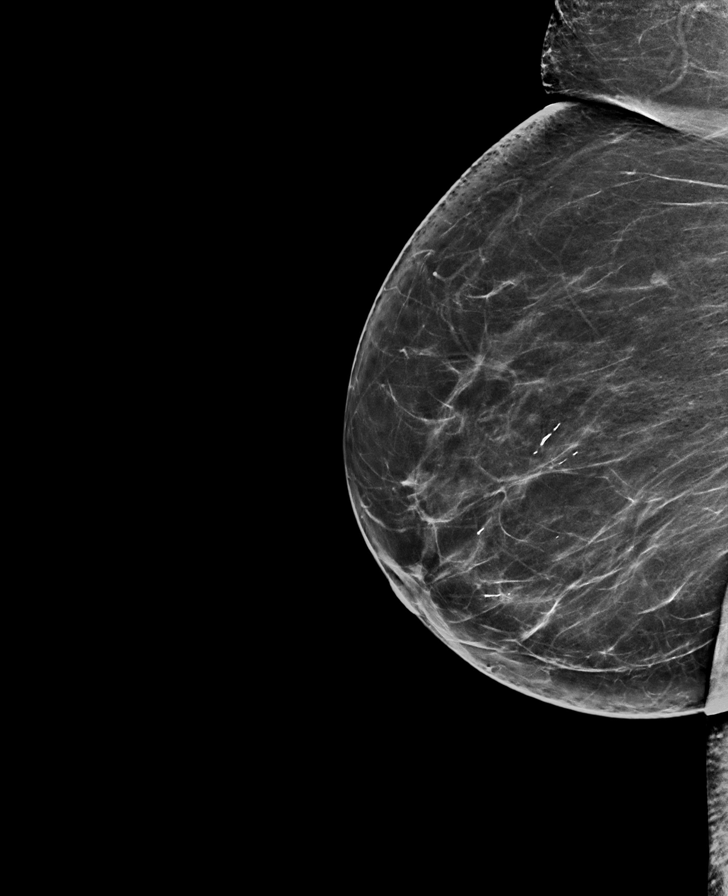

[L MLO synth-2D]
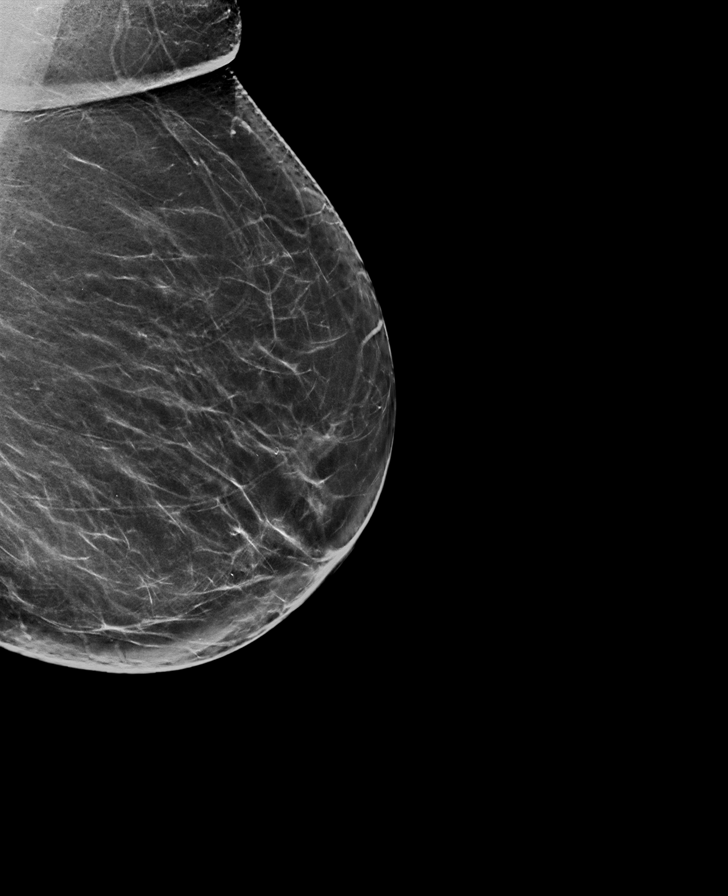

[L CC synth-2D]
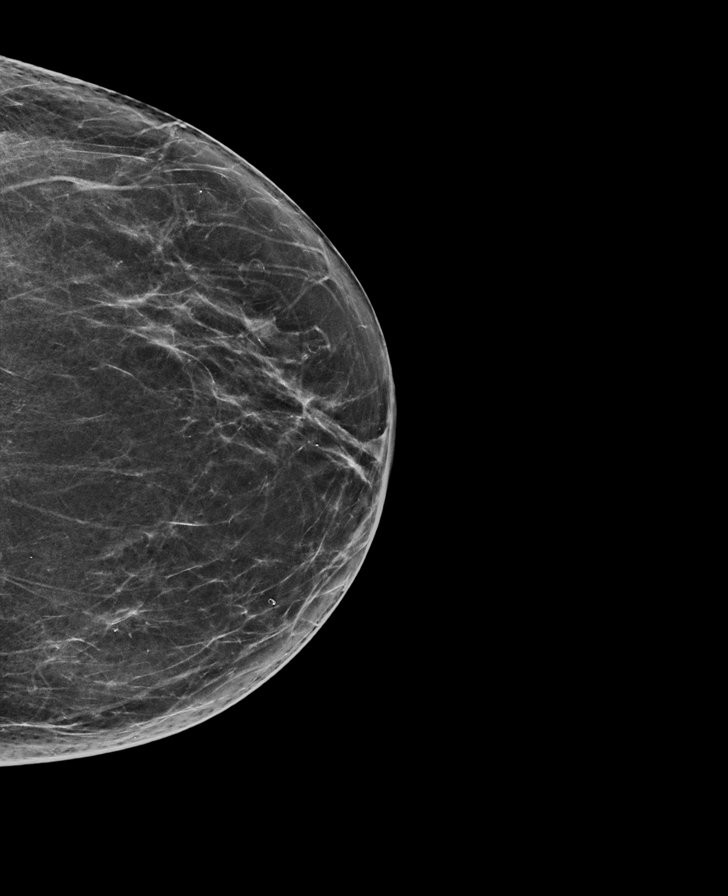

[R CC synth-2D]
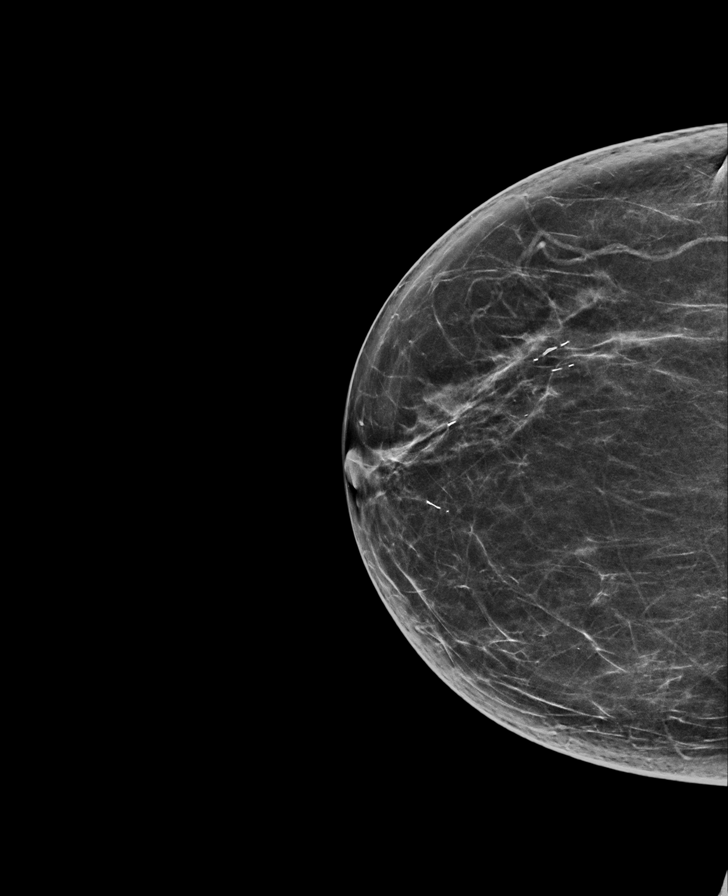

[L MLO tomo · tomo slice 42/83.0]
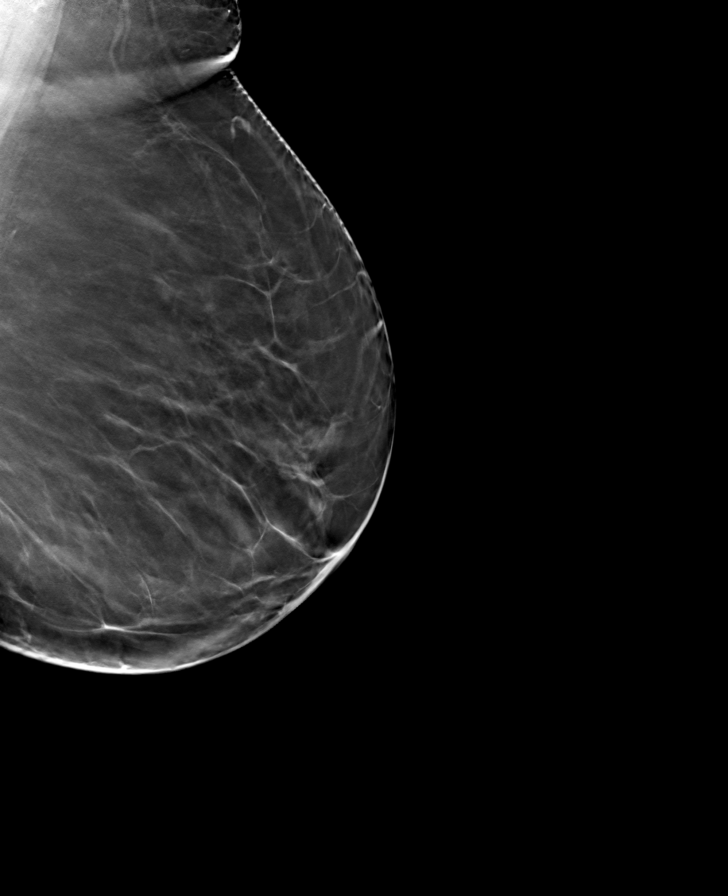

[R CC tomo · tomo slice 36/71.0]
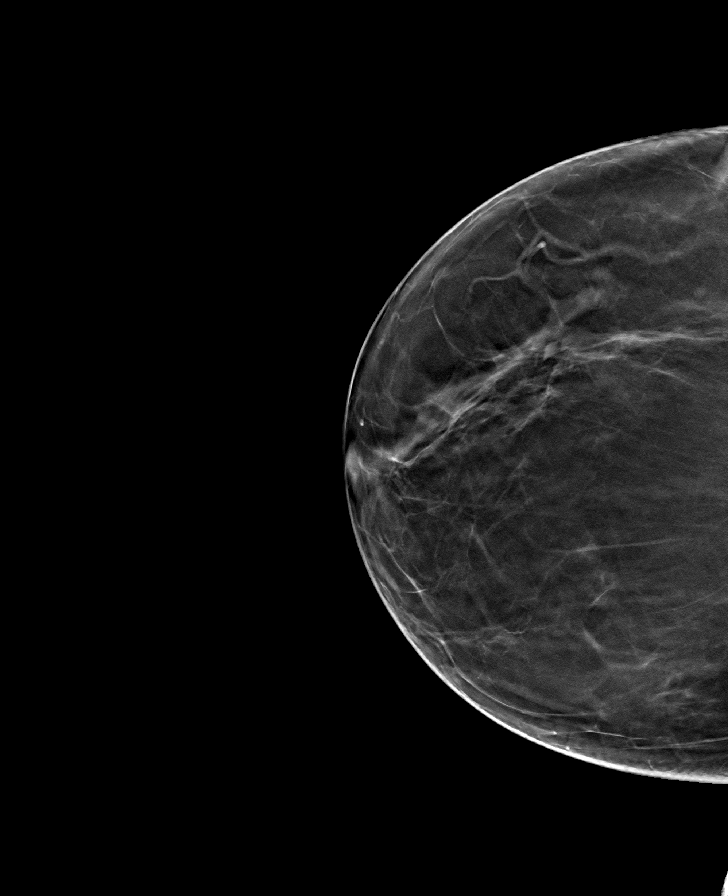

[R MLO tomo · tomo slice 41/80.0]
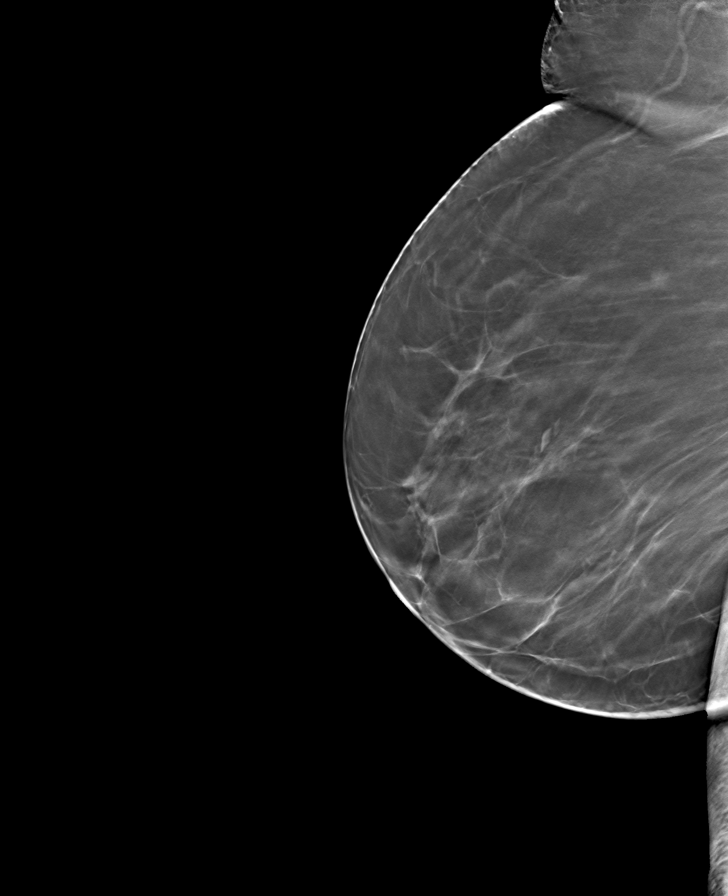

[L CC tomo · tomo slice 36/71.0]
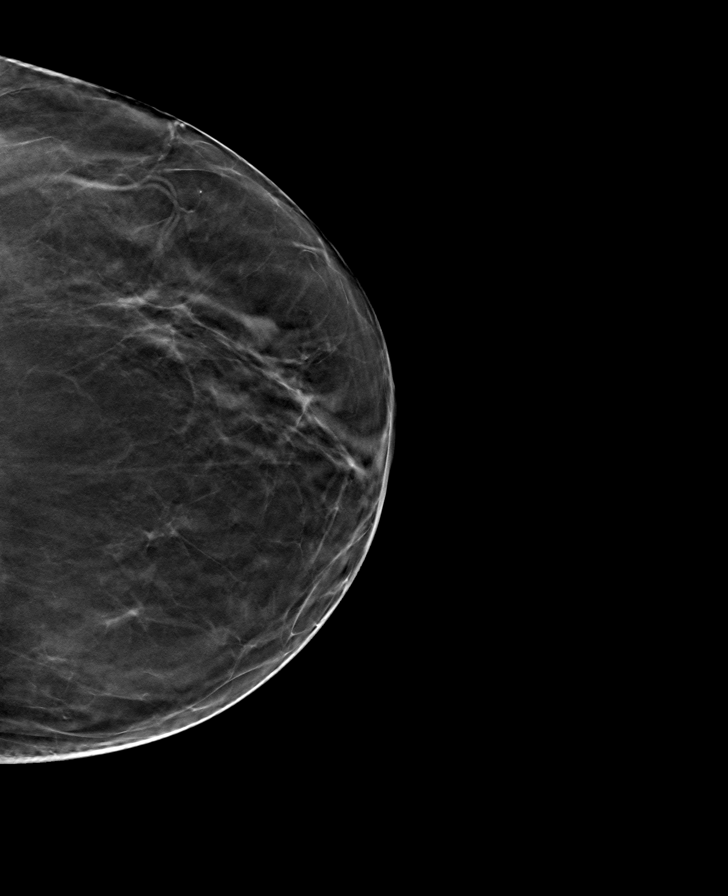

[8 of 24 positions shown; findings below may reference images not displayed]

ACR Breast Density Category b: There are scattered areas of
fibroglandular density.
FINDINGS: There are no findings suspicious for malignancy.
IMPRESSION: No mammographic evidence of malignancy. A result letter of this
screening mammogram will be mailed directly to the patient.

RECOMMENDATION:
Screening mammogram in one year. (Code:51-O-LD2)

BI-RADS CATEGORY  1: Negative.

## 2023-01-11 ENCOUNTER — Other Ambulatory Visit: Payer: Self-pay | Admitting: Family Medicine

## 2023-01-11 DIAGNOSIS — R2232 Localized swelling, mass and lump, left upper limb: Secondary | ICD-10-CM

## 2023-01-11 DIAGNOSIS — E049 Nontoxic goiter, unspecified: Secondary | ICD-10-CM

## 2023-01-13 ENCOUNTER — Ambulatory Visit
Admission: RE | Admit: 2023-01-13 | Discharge: 2023-01-13 | Disposition: A | Discharge status: Home / Self Care | Payer: Medicare PPO | Source: Ambulatory Visit | Attending: Family Medicine | Admitting: Family Medicine

## 2023-01-13 DIAGNOSIS — E049 Nontoxic goiter, unspecified: Secondary | ICD-10-CM | POA: Insufficient documentation

## 2023-01-13 DIAGNOSIS — R2232 Localized swelling, mass and lump, left upper limb: Secondary | ICD-10-CM

## 2023-01-17 ENCOUNTER — Other Ambulatory Visit: Payer: Medicare PPO

## 2023-02-02 ENCOUNTER — Observation Stay: Payer: Medicare PPO

## 2023-02-02 ENCOUNTER — Observation Stay
Admission: EM | Admit: 2023-02-02 | Discharge: 2023-02-03 | Disposition: A | Discharge status: Home / Self Care | Payer: Medicare PPO | Attending: Hospitalist | Admitting: Hospitalist

## 2023-02-02 ENCOUNTER — Other Ambulatory Visit: Payer: Self-pay

## 2023-02-02 ENCOUNTER — Encounter: Payer: Self-pay | Admitting: Internal Medicine

## 2023-02-02 ENCOUNTER — Emergency Department: Payer: Medicare PPO

## 2023-02-02 DIAGNOSIS — Z79899 Other long term (current) drug therapy: Secondary | ICD-10-CM | POA: Diagnosis not present

## 2023-02-02 DIAGNOSIS — R29818 Other symptoms and signs involving the nervous system: Principal | ICD-10-CM | POA: Insufficient documentation

## 2023-02-02 DIAGNOSIS — G459 Transient cerebral ischemic attack, unspecified: Secondary | ICD-10-CM | POA: Diagnosis present

## 2023-02-02 DIAGNOSIS — E119 Type 2 diabetes mellitus without complications: Secondary | ICD-10-CM | POA: Insufficient documentation

## 2023-02-02 DIAGNOSIS — Z8673 Personal history of transient ischemic attack (TIA), and cerebral infarction without residual deficits: Secondary | ICD-10-CM | POA: Diagnosis not present

## 2023-02-02 DIAGNOSIS — D509 Iron deficiency anemia, unspecified: Secondary | ICD-10-CM | POA: Diagnosis not present

## 2023-02-02 DIAGNOSIS — Z7982 Long term (current) use of aspirin: Secondary | ICD-10-CM | POA: Diagnosis not present

## 2023-02-02 DIAGNOSIS — E1159 Type 2 diabetes mellitus with other circulatory complications: Secondary | ICD-10-CM

## 2023-02-02 DIAGNOSIS — Z87891 Personal history of nicotine dependence: Secondary | ICD-10-CM | POA: Insufficient documentation

## 2023-02-02 DIAGNOSIS — I152 Hypertension secondary to endocrine disorders: Secondary | ICD-10-CM

## 2023-02-02 DIAGNOSIS — Z7984 Long term (current) use of oral hypoglycemic drugs: Secondary | ICD-10-CM | POA: Insufficient documentation

## 2023-02-02 DIAGNOSIS — E785 Hyperlipidemia, unspecified: Secondary | ICD-10-CM | POA: Insufficient documentation

## 2023-02-02 DIAGNOSIS — R2990 Unspecified symptoms and signs involving the nervous system: Secondary | ICD-10-CM | POA: Diagnosis not present

## 2023-02-02 DIAGNOSIS — R200 Anesthesia of skin: Secondary | ICD-10-CM | POA: Diagnosis present

## 2023-02-02 DIAGNOSIS — I2584 Coronary atherosclerosis due to calcified coronary lesion: Secondary | ICD-10-CM | POA: Insufficient documentation

## 2023-02-02 DIAGNOSIS — E876 Hypokalemia: Secondary | ICD-10-CM | POA: Diagnosis not present

## 2023-02-02 DIAGNOSIS — I2510 Atherosclerotic heart disease of native coronary artery without angina pectoris: Secondary | ICD-10-CM | POA: Diagnosis not present

## 2023-02-02 DIAGNOSIS — R29810 Facial weakness: Secondary | ICD-10-CM

## 2023-02-02 DIAGNOSIS — I6529 Occlusion and stenosis of unspecified carotid artery: Secondary | ICD-10-CM

## 2023-02-02 DIAGNOSIS — R0989 Other specified symptoms and signs involving the circulatory and respiratory systems: Secondary | ICD-10-CM | POA: Diagnosis not present

## 2023-02-02 DIAGNOSIS — I10 Essential (primary) hypertension: Secondary | ICD-10-CM | POA: Insufficient documentation

## 2023-02-02 DIAGNOSIS — I6502 Occlusion and stenosis of left vertebral artery: Secondary | ICD-10-CM | POA: Diagnosis not present

## 2023-02-02 DIAGNOSIS — R202 Paresthesia of skin: Secondary | ICD-10-CM

## 2023-02-02 LAB — DIFFERENTIAL
Abs Immature Granulocytes: 0.02 10*3/uL (ref 0.00–0.07)
Basophils Absolute: 0.1 10*3/uL (ref 0.0–0.1)
Basophils Relative: 1 %
Eosinophils Absolute: 0.4 10*3/uL (ref 0.0–0.5)
Eosinophils Relative: 5 %
Immature Granulocytes: 0 %
Lymphocytes Relative: 31 %
Lymphs Abs: 2 10*3/uL (ref 0.7–4.0)
Monocytes Absolute: 0.7 10*3/uL (ref 0.1–1.0)
Monocytes Relative: 11 %
Neutro Abs: 3.4 10*3/uL (ref 1.7–7.7)
Neutrophils Relative %: 52 %

## 2023-02-02 LAB — COMPREHENSIVE METABOLIC PANEL
ALT: 24 U/L (ref 0–44)
AST: 27 U/L (ref 15–41)
Albumin: 3.7 g/dL (ref 3.5–5.0)
Alkaline Phosphatase: 89 U/L (ref 38–126)
Anion gap: 10 (ref 5–15)
BUN: 20 mg/dL (ref 8–23)
CO2: 26 mmol/L (ref 22–32)
Calcium: 8.9 mg/dL (ref 8.9–10.3)
Chloride: 98 mmol/L (ref 98–111)
Creatinine, Ser: 1.13 mg/dL — ABNORMAL HIGH (ref 0.44–1.00)
GFR, Estimated: 48 mL/min — ABNORMAL LOW (ref >60)
Glucose, Bld: 96 mg/dL (ref 70–99)
Potassium: 3.4 mmol/L — ABNORMAL LOW (ref 3.5–5.1)
Sodium: 134 mmol/L — ABNORMAL LOW (ref 135–145)
Total Bilirubin: 0.7 mg/dL (ref 0.3–1.2)
Total Protein: 7.5 g/dL (ref 6.5–8.1)

## 2023-02-02 LAB — CBC
HCT: 30.3 % — ABNORMAL LOW (ref 36.0–46.0)
Hemoglobin: 9.6 g/dL — ABNORMAL LOW (ref 12.0–15.0)
MCH: 23.8 pg — ABNORMAL LOW (ref 26.0–34.0)
MCHC: 31.7 g/dL (ref 30.0–36.0)
MCV: 75.2 fL — ABNORMAL LOW (ref 80.0–100.0)
Platelets: 252 10*3/uL (ref 150–400)
RBC: 4.03 MIL/uL (ref 3.87–5.11)
RDW: 16.1 % — ABNORMAL HIGH (ref 11.5–15.5)
WBC: 6.5 10*3/uL (ref 4.0–10.5)
nRBC: 0 % (ref 0.0–0.2)

## 2023-02-02 LAB — APTT: aPTT: 33 seconds (ref 24–36)

## 2023-02-02 LAB — PROTIME-INR
INR: 1.2 (ref 0.8–1.2)
Prothrombin Time: 15.1 seconds (ref 11.4–15.2)

## 2023-02-02 LAB — CBG MONITORING, ED: Glucose-Capillary: 87 mg/dL (ref 70–99)

## 2023-02-02 LAB — GLUCOSE, CAPILLARY: Glucose-Capillary: 105 mg/dL — ABNORMAL HIGH (ref 70–99)

## 2023-02-02 LAB — ETHANOL: Alcohol, Ethyl (B): <10 mg/dL (ref <10)

## 2023-02-02 MED ORDER — LOSARTAN POTASSIUM 50 MG PO TABS: 100.0000 mg | ORAL_TABLET | Freq: Every day | ORAL | Status: DC

## 2023-02-02 MED ORDER — LORATADINE 10 MG PO TABS
10.0000 mg | ORAL_TABLET | Freq: Every day | ORAL | Status: DC
  Administered 2023-02-02: 10 mg via ORAL
  Filled 2023-02-02: qty 1

## 2023-02-02 MED ORDER — POLYETHYLENE GLYCOL 3350 17 G PO PACK: 17.0000 g | PACK | Freq: Every day | ORAL | Status: DC | PRN

## 2023-02-02 MED ORDER — SODIUM CHLORIDE 0.9% FLUSH
3.0000 mL | Freq: Once | INTRAVENOUS | Status: AC
  Administered 2023-02-02: 3 mL via INTRAVENOUS

## 2023-02-02 MED ORDER — HYDRALAZINE HCL 50 MG PO TABS: 100.0000 mg | ORAL_TABLET | Freq: Two times a day (BID) | ORAL | Status: DC

## 2023-02-02 MED ORDER — ACETAMINOPHEN 650 MG RE SUPP: 325.0000 mg | RECTAL | Status: DC | PRN

## 2023-02-02 MED ORDER — ASPIRIN 81 MG PO TBEC
81.0000 mg | DELAYED_RELEASE_TABLET | Freq: Every day | ORAL | Status: DC
  Administered 2023-02-03: 81 mg via ORAL
  Filled 2023-02-02: qty 1

## 2023-02-02 MED ORDER — IOHEXOL 350 MG/ML SOLN
75.0000 mL | Freq: Once | INTRAVENOUS | Status: AC | PRN
  Administered 2023-02-02: 75 mL via INTRAVENOUS

## 2023-02-02 MED ORDER — ACETAMINOPHEN 160 MG/5ML PO SOLN: 325.0000 mg | ORAL | Status: DC | PRN

## 2023-02-02 MED ORDER — SENNOSIDES-DOCUSATE SODIUM 8.6-50 MG PO TABS: 1.0000 | ORAL_TABLET | Freq: Every evening | ORAL | Status: DC | PRN

## 2023-02-02 MED ORDER — STROKE: EARLY STAGES OF RECOVERY BOOK: Freq: Once | Status: AC

## 2023-02-02 MED ORDER — ASPIRIN 81 MG PO TBEC
81.0000 mg | DELAYED_RELEASE_TABLET | Freq: Every day | ORAL | Status: DC
  Administered 2023-02-02: 81 mg via ORAL
  Filled 2023-02-02: qty 1

## 2023-02-02 MED ORDER — ENOXAPARIN SODIUM 40 MG/0.4ML IJ SOSY: 40.0000 mg | PREFILLED_SYRINGE | INTRAMUSCULAR | Status: DC

## 2023-02-02 MED ORDER — HYDRALAZINE HCL 20 MG/ML IJ SOLN: 5.0000 mg | Freq: Four times a day (QID) | INTRAMUSCULAR | Status: DC | PRN

## 2023-02-02 MED ORDER — INSULIN ASPART 100 UNIT/ML IJ SOLN: 0.0000 [IU] | Freq: Three times a day (TID) | INTRAMUSCULAR | Status: DC

## 2023-02-02 MED ORDER — POTASSIUM CHLORIDE CRYS ER 20 MEQ PO TBCR
20.0000 meq | EXTENDED_RELEASE_TABLET | Freq: Once | ORAL | Status: AC
  Administered 2023-02-02: 20 meq via ORAL
  Filled 2023-02-02: qty 1

## 2023-02-02 MED ORDER — ACETAMINOPHEN 325 MG PO TABS: 325.0000 mg | ORAL_TABLET | ORAL | Status: DC | PRN

## 2023-02-02 MED ORDER — CHLORTHALIDONE 25 MG PO TABS: 25.0000 mg | ORAL_TABLET | Freq: Every day | ORAL | Status: DC

## 2023-02-02 MED ORDER — CLOPIDOGREL BISULFATE 75 MG PO TABS
75.0000 mg | ORAL_TABLET | Freq: Every day | ORAL | Status: DC
  Administered 2023-02-03: 75 mg via ORAL
  Filled 2023-02-02: qty 1

## 2023-02-02 MED ORDER — INSULIN ASPART 100 UNIT/ML IJ SOLN: 0.0000 [IU] | Freq: Every day | INTRAMUSCULAR | Status: DC

## 2023-02-02 MED ORDER — CLOPIDOGREL BISULFATE 75 MG PO TABS
75.0000 mg | ORAL_TABLET | Freq: Once | ORAL | Status: AC
  Administered 2023-02-02: 75 mg via ORAL
  Filled 2023-02-02: qty 1

## 2023-02-02 MED ORDER — CARVEDILOL 6.25 MG PO TABS: 12.5000 mg | ORAL_TABLET | Freq: Two times a day (BID) | ORAL | Status: DC

## 2023-02-02 MED ORDER — ATORVASTATIN CALCIUM 20 MG PO TABS
80.0000 mg | ORAL_TABLET | Freq: Every day | ORAL | Status: DC
  Administered 2023-02-02: 80 mg via ORAL
  Filled 2023-02-02: qty 4

## 2023-02-02 NOTE — ED Triage Notes (Signed)
Pt from home, resides alone, states that today at 12noon she began having left leg numbness and tingling. Rt sided facial droop. Pt has hx of CVA in past.  CBG with EMS- 102 181/81 63

## 2023-02-02 NOTE — Code Documentation (Addendum)
Stroke Response Nurse Documentation Code Documentation  Anne Herrera is a 87 y.o. female arriving to [facility] via [facility] on 02/02/2023 with past medical hx of DM, HTN, high cholesterol, stroke with reported residual right leg weakness. On No antithrombotic. Code stroke was activated by EMS.   Patient from home where she was LKW at 1200 and now complaining of facial droop and left leg tingling. Patient reports being at church today when she had acute onset left leg tingling. EMS was called when symptoms did not resolve after patient got home from church. Upon EMS arrival, facial droop was noted.   Stroke team at the bedside on patient arrival. Labs drawn and patient cleared for CT by Dr. Sidney Ace. Patient to CT with team. Initial NIHSS 5, see documentation for details and code stroke times. Patient with left facial droop, bilateral leg weakness, and Expressive aphasia  on exam. The following imaging was completed:  CT Head. Patient is not a candidate for IV Thrombolytic due to too mild to treat, per MD. Patient is not a candidate for IR due to consistent with LVO, per MD. NIHSS 3 on reassessment for right lower leg drift, facial droop, and left sensory deficit. Patient educated at bedside on BE-FAST and importance of calling 911 immediately upon acute stroke symptoms.   Care Plan: q67min NIHSS and vital signs until patient outside window at 1630 followed bu q2h NIHSS and vitals q2h x 12 hrs followed by q4hr. Swallow screen per order.   Bedside handoff with ED RN Jerilee Field.    Wille Glaser  Stroke Response RN

## 2023-02-02 NOTE — Code Documentation (Signed)
CODE STROKE- PHARMACY COMMUNICATION   Time CODE STROKE called/page received:1440  Time response to CODE STROKE was made (in person or via phone): 1450  Time Stroke Kit retrieved from Pyxis (only if needed):N/A  Name of Provider/Nurse contacted:Dr. Selina Cooley  Past Medical History:  Diagnosis Date   Diabetes mellitus without complication (HCC)    Gout    High cholesterol    Hypertension    Stroke [facility])    2016   Stroke [facility])    Prior to Admission medications   Medication Sig Start Date End Date Taking? Authorizing Provider  acetaminophen (TYLENOL) 325 MG tablet Take 1-2 tablets (325-650 mg total) by mouth every 4 (four) hours as needed for mild pain. 05/22/15   Love, Evlyn Kanner, PA-C  alum & mag hydroxide-simeth (MAALOX/MYLANTA) 200-200-20 MG/5ML suspension Take 30 mLs by mouth every 4 (four) hours as needed for indigestion. 05/22/15   Love, Evlyn Kanner, PA-C  aspirin EC 81 MG EC tablet Take 1 tablet (81 mg total) by mouth daily. 05/22/15   Love, Evlyn Kanner, PA-C  atorvastatin (LIPITOR) 80 MG tablet Take 1 tablet (80 mg total) by mouth daily at 6 PM. 05/22/15   Love, Evlyn Kanner, PA-C  carvedilol (COREG) 12.5 MG tablet Take 1 tablet (12.5 mg total) by mouth 2 (two) times daily with a meal. 05/22/15   Love, Evlyn Kanner, PA-C  chlorthalidone (HYGROTON) 25 MG tablet Take 25 mg by mouth daily.    [provider]  cloNIDine (CATAPRES) 0.1 MG tablet Take 0.1 mg by mouth 2 (two) times daily.    [provider]  doxycycline (VIBRA-TABS) 100 MG tablet Take 1 tablet (100 mg total) by mouth 2 (two) times daily. 09/21/21   Felecia Shelling, DPM  ergocalciferol (VITAMIN D2) 1.25 MG (50000 UT) capsule  08/06/21   [provider]  gentamicin cream (GARAMYCIN) 0.1 % Apply 1 application topically 2 (two) times daily. 09/07/21   Felecia Shelling, DPM  glipiZIDE (GLUCOTROL XL) 2.5 MG 24 hr tablet Take 2.5 mg by mouth daily. Patient not taking: Reported on 09/07/2021    [provider]   hydrALAZINE (APRESOLINE) 10 MG tablet Take by mouth. 10/24/18 10/24/19  [provider]  IRON PO Take 1 Dose by mouth daily.     [provider]  Lactase 9000 UNITS CHEW Chew 1 tablet (9,000 Units total) by mouth 3 (three) times daily with meals. 05/22/15   Love, Evlyn Kanner, PA-C  loratadine (CLARITIN) 10 MG tablet Take 1 tablet (10 mg total) by mouth daily. 05/22/15   Love, Evlyn Kanner, PA-C  losartan (COZAAR) 100 MG tablet Take 100 mg by mouth daily.    [provider]  metFORMIN (GLUCOPHAGE-XR) 500 MG 24 hr tablet Take 1 tablet (500 mg total) by mouth 2 (two) times daily with a meal. 05/22/15   Love, Evlyn Kanner, PA-C  polycarbophil (FIBERCON) 625 MG tablet Take 1 tablet (625 mg total) by mouth 2 (two) times daily. 05/22/15   Love, Evlyn Kanner, PA-C  potassium chloride (MICRO-K) 10 MEQ CR capsule Take 20 mEq by mouth daily.    [provider]  triamcinolone ointment (KENALOG) 0.1 % Apply topically 2 (two) times daily. 05/22/15   LoveEvlyn Kanner, PA-C    Barrie Folk ,PharmD Clinical Pharmacist  02/02/2023  4:29 PM

## 2023-02-02 NOTE — Assessment & Plan Note (Addendum)
Non-insulin-dependent diabetes mellitus Home glipizide not resumed on admission Metformin was discontinued outpatient one week ago due to patient developing diarrhea Insulin SSI with at bedtime coverage ordered

## 2023-02-02 NOTE — Assessment & Plan Note (Signed)
Patient aware.

## 2023-02-02 NOTE — ED Provider Notes (Signed)
[facility] Provider Note    Event Date/Time   First MD Initiated Contact with Patient 02/02/23 1452     (approximate)   History   Code Stroke   HPI Rosette Sutherlin is a 87 y.o. female with past medical history of hyperlipidemia, prior left basal ganglia stroke who presents with left leg numbness.  Patient was at church when she suddenly felt numbness to her left lower extremity.  Describes it as a tingling feeling from the knee down to her toes combined with her from the back of the leg.  Several days ago she also noted numbness in her left hand.  Also describes this as a tingling feeling without weakness.  This lasted for about a day.  She did call EMS but then symptoms resolved.  She denies any vision change or weakness.  No back pain.  No chest pain or dyspnea.  Denies headache.  Does have history of prior left basal ganglia stroke with residual subtle right-sided facial droop.     Past Medical History:  Diagnosis Date   Diabetes mellitus without complication (HCC)    Gout    High cholesterol    Hypertension    Stroke (HCC)    2016   Stroke [facility])     Patient Active Problem List   Diagnosis Date Noted   Iron deficiency anemia 01/09/2017   Diabetes (HCC) 05/20/2015   HTN (hypertension) 05/20/2015   Spastic hemiplegia affecting right dominant side (HCC) 05/07/2015   Dysarthria, post-stroke 05/07/2015   Lacunar stroke of left subthalamic region (HCC) 05/06/2015   TIA (transient ischemic attack) 04/28/2015     Physical Exam  Triage Vital Signs: ED Triage Vitals  Enc Vitals Group     BP 02/02/23 1504 (!) 172/69     Pulse Rate 02/02/23 1504 68     Resp 02/02/23 1504 18     Temp 02/02/23 1505 98.2 F (36.8 C)     Temp Source 02/02/23 1505 Oral     SpO2 02/02/23 1504 98 %     Weight 02/02/23 1504 171 lb (77.6 kg)     Height 02/02/23 1504 5\' 4"  (1.626 m)     Head Circumference --      Peak Flow --      Pain Score 02/02/23 1504 0      Pain Loc --      Pain Edu? --      Excl. in GC? --     Most recent vital signs: Vitals:   02/02/23 1504 02/02/23 1505  BP: (!) 172/69   Pulse: 68   Resp: 18   Temp:  98.2 F (36.8 C)  SpO2: 98%      General: Awake, no distress. CV:  Good peripheral perfusion.  Resp:  Normal effort.  Abd:  No distention.  Neuro:             Awake, Alert, Oriented x 3  Other:  Aox3, nml speech  PERRL, EOMI, subtle right-sided facial droop, nml tongue movement  5/5 strength in the BL upper and lower extremities  Sensation grossly intact in the BL upper and lower extremities  Finger-nose-finger intact BL    ED Results / Procedures / Treatments  Labs (all labs ordered are listed, but only abnormal results are displayed) Labs Reviewed  CBC - Abnormal; Notable for the following components:      Result Value   Hemoglobin 9.6 (*)    HCT 30.3 (*)    MCV  75.2 (*)    MCH 23.8 (*)    RDW 16.1 (*)    All other components within normal limits  COMPREHENSIVE METABOLIC PANEL - Abnormal; Notable for the following components:   Sodium 134 (*)    Potassium 3.4 (*)    Creatinine, Ser 1.13 (*)    GFR, Estimated 48 (*)    All other components within normal limits  PROTIME-INR  APTT  DIFFERENTIAL  ETHANOL  CBG MONITORING, ED  I-STAT CREATININE, ED     EKG  EKG interpretation performed by myself: NSR, nml axis, nml intervals, no acute ischemic changes    RADIOLOGY I reviewed and interpreted the CT scan of the brain which does not show any acute intracranial process    PROCEDURES:  Critical Care performed: Yes, see critical care procedure note(s)  Procedures  The patient is on the cardiac monitor to evaluate for evidence of arrhythmia and/or significant heart rate changes.   MEDICATIONS ORDERED IN ED: Medications  sodium chloride flush (NS) 0.9 % injection 3 mL (has no administration in time range)  clopidogrel (PLAVIX) tablet 75 mg (has no administration in time range)      IMPRESSION / MDM / ASSESSMENT AND PLAN / ED COURSE  I reviewed the triage vital signs and the nursing notes.                              Patient's presentation is most consistent with acute presentation with potential threat to life or bodily function.  Differential diagnosis includes, but is not limited to, CVA, intracranial hemorrhage, peripheral neuropathy  Patient is an 87 female with history of cardiac vasovagal stroke with residual right-sided facial droop presents because of left leg numbness.  This started acutely around noon today.  It is primarily from the knee down but does involve both front and back.  She has no associated weakness.  When EMS arrived they noticed facial droop.  Stroke alert was called.  On my exam patient does have subtle right-sided facial droop which she says is stable from prior.  Does not have any objective weakness.  Sensation to light touch on her extremities is intact.  Patient was taken to CT which is negative for acute abnormality.  Patient was evaluated by neurology who recommends admission for stroke/TIA workup given patient's significant risk factors and the fact she did have arm numbness on the left side several days ago.  Will obtain CTA as well as MRI brain without contrast.  Neurology is recommending dual antiplatelet therapy.       FINAL CLINICAL IMPRESSION(S) / ED DIAGNOSES   Final diagnoses:  Numbness     Rx / DC Orders   ED Discharge Orders     None        Note:  This document was prepared using Dragon voice recognition software and may include unintentional dictation errors.   Georga Hacking, MD 02/02/23 [phone]

## 2023-02-02 NOTE — H&P (Signed)
History and Physical   Anne Herrera JXB:[id number] DOB: 1936/01/14 DOA: 02/02/2023  PCP: Leim Fabry, MD  Outpatient Specialists: Dr. Juliann Pares, [facility] cardiology Patient coming from: church via EMS  I have personally briefly reviewed patient's old medical records in [facility] EMR.  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  Vitals in the ED showed temperature of 98.2, respiration rate of 18, heart rate of 68, blood pressure 172/69, SpO2 of 98% on room air.  Serum sodium is 134, potassium 3.4, chloride 98, bicarb 26, BUN of 20, serum creatinine 1.13, EGFR 48, nonfasting blood glucose 96, WBC 6.5, hemoglobin 9.6, platelets of 252.  EtOH level was less than 10.  CT head wo contrast: Read as no evidence of acute intracranial abnormality.  Prior left basal cannula perforator infarct.  MRI brain wo contrast: Was read as no acute intracranial process.  ED treatment: Plavix 75 mg p.o. one-time dose --------------------------- At bedside, she was able to tell me her name, current year, location.After multiple attempts and corrections,she did agree that she is 59 and not 76.  She was sitting at church, when she noticed left leg tingling, that is persistent. She denies trauma to her person.She denies chest pain, shortness of breath, dysuria, hematuria, diarrhea.  She had diarrhea for two weeks and her pcp discontinued metformin last week and this has resolved the diarrhea.  Social history: She lives on her own. She denies tobacco, etoh, recreational drug. She is retired and formerly worked at [employer].   ROS: Constitutional:  no weight change, no fever ENT/Mouth: no sore throat, no rhinorrhea Eyes: no eye pain, no vision changes Cardiovascular: no chest pain, no dyspnea,  no edema, no palpitations Respiratory: no cough, no sputum, no wheezing Gastrointestinal: no nausea, no vomiting, no diarrhea, no constipation Genitourinary: no urinary incontinence, no dysuria, no hematuria Musculoskeletal: no arthralgias, no myalgias Skin: no skin lesions, no pruritus, Neuro: + weakness, no loss of consciousness, no syncope,+ left lower extremity numbness and tingling Psych: no anxiety, no depression, + decrease appetite Heme/Lymph: no bruising, no bleeding  ED Course: Discussed with emergency medicine provider, patient requiring hospitalization for chief concerns of stroke workup.  Assessment/Plan  Principal Problem:   Stroke-like symptom Active Problems:   TIA (transient ischemic attack)   Diabetes (HCC)   HTN (hypertension)   Iron deficiency anemia   Hyperlipemia   Vertebral artery stenosis, left   Assessment and Plan:  * Stroke-like symptom TIA versus stroke Neurology has been consulted and we appreciate further recommendations Aspirin 81 mg daily resumed, Plavix 75 mg daily initiated Complete echo MRI of the brain and CTA H/N w wo CM Fasting lipid and A1c ordered Permissive hypertension per neurology recommendations: maintain BP < 220/110 for 48 hours Frequent neuro vascular checks N.p.o. pending swallow study Fall precaution and aspiration precaution  Vertebral artery stenosis, left Patient aware  Hyperlipemia Home atorvastatin 80 mg nightly resumed  Iron deficiency anemia Baseline hemoglobin is 9.4-10 CBC without differential in the a.m.  HTN (hypertension) Home Coreg 12.5 mg p.o. twice daily, chlorthalidone 25 mg daily, hydralazine 100 mg daily, losartan 100 mg daily not resumed on admission Hydralazine 5 mg IV every 6 hours as needed for BP greater than 220/110,  48 hours of coverage  ordered AM team to resume home antihypertensive medications when the benefits outweigh the risk  Diabetes (HCC) Non-insulin-dependent diabetes mellitus Home glipizide not resumed on admission Metformin was discontinued outpatient one week ago due to patient developing diarrhea Insulin SSI with at bedtime coverage ordered  Chart reviewed.   DVT prophylaxis: Enoxaparin 40 mg subcutaneous starting on 02/03/2023 Code Status: Full code Diet: Heart healthy/carb modified Family Communication: Updated her son, Anne Herrera with her permission Disposition Plan: Pending CTA of the head and neck and neurology recommendation Consults called: Neurology service Admission status: Telemetry medical, observation  Past Medical History:  Diagnosis Date   Diabetes mellitus without complication (HCC)    Gout    High cholesterol    Hypertension    Stroke (HCC)    2016   Stroke [facility])    Past Surgical History:  Procedure Laterality Date   TUBAL LIGATION     Social History:  reports that she quit smoking about 49 years ago. Her smoking use included cigarettes. She has never used smokeless tobacco. She reports that she does not drink alcohol and does not use drugs.  Allergies  Allergen Reactions   Acetaminophen Other (See Comments)    Excessive sedation at 500mg  dose   Amlodipine Hives, Itching and Other (See Comments)    Face breaks out.   Lisinopril Swelling    Reaction:  Mouth swelling   Sulfa Antibiotics    Family History  Problem Relation Age of Onset   Hypertension Other        all family members   CAD Mother    CAD Brother    Breast cancer Cousin        mat and pat cousins   Anemia Father    Anemia Son    Anemia Daughter    Family history: Family history reviewed and not pertinent  Prior to Admission medications   Medication Sig Start Date End Date Taking? Authorizing Provider  acetaminophen (TYLENOL) 325 MG tablet Take 1-2 tablets (325-650 mg total) by mouth every 4 (four) hours  as needed for mild pain. 05/22/15   Love, Evlyn Kanner, PA-C  alum & mag hydroxide-simeth (MAALOX/MYLANTA) 200-200-20 MG/5ML suspension Take 30 mLs by mouth every 4 (four) hours as needed for indigestion. 05/22/15   Love, Evlyn Kanner, PA-C  aspirin EC 81 MG EC tablet Take 1 tablet (81 mg total) by mouth daily. 05/22/15   Love, Evlyn Kanner, PA-C  atorvastatin (LIPITOR) 80 MG tablet Take 1 tablet (80 mg total) by mouth daily at 6 PM. 05/22/15   Love, Evlyn Kanner, PA-C  carvedilol (COREG) 12.5 MG tablet Take 1 tablet (12.5 mg total) by mouth 2 (two) times daily with a meal. 05/22/15   Love, Evlyn Kanner, PA-C  chlorthalidone (HYGROTON) 25 MG tablet Take 25 mg by mouth daily.    [provider]  cloNIDine (CATAPRES) 0.1 MG tablet Take 0.1 mg by mouth 2 (two) times daily.    [provider]  doxycycline (VIBRA-TABS) 100 MG tablet Take 1 tablet (100 mg total) by mouth 2 (two) times daily. 09/21/21   Felecia Shelling, DPM  ergocalciferol (VITAMIN D2) 1.25 MG (50000 UT) capsule  08/06/21   [provider]  gentamicin cream (GARAMYCIN) 0.1 % Apply 1 application topically 2 (two) times daily. 09/07/21   Felecia Shelling, DPM  glipiZIDE (GLUCOTROL XL) 2.5 MG 24 hr tablet Take 2.5 mg by mouth daily. Patient not taking: Reported on 09/07/2021    [provider]  hydrALAZINE (APRESOLINE) 10 MG tablet Take by mouth. 10/24/18 10/24/19  [provider]  IRON PO Take 1 Dose by mouth daily.     [provider]  Lactase 9000 UNITS CHEW Chew 1 tablet (9,000 Units total) by mouth 3 (three) times daily with meals. 05/22/15   Love, Evlyn Kanner, PA-C  loratadine (CLARITIN) 10 MG tablet Take 1 tablet (10 mg total) by mouth daily. 05/22/15   Love, Evlyn Kanner, PA-C  losartan (COZAAR) 100 MG tablet Take 100 mg by mouth daily.    [provider]  metFORMIN (GLUCOPHAGE-XR) 500 MG 24 hr tablet Take 1 tablet (500 mg total) by mouth 2 (two) times daily with a meal. 05/22/15   Love, Evlyn Kanner, PA-C   polycarbophil (FIBERCON) 625 MG tablet Take 1 tablet (625 mg total) by mouth 2 (two) times daily. 05/22/15   Love, Evlyn Kanner, PA-C  potassium chloride (MICRO-K) 10 MEQ CR capsule Take 20 mEq by mouth daily.    [provider]  triamcinolone ointment (KENALOG) 0.1 % Apply topically 2 (two) times daily. 05/22/15   Jacquelynn Cree, PA-C   Physical Exam: Vitals:   02/02/23 1504 02/02/23 1505 02/02/23 1740  BP: (!) 172/69  (!) 163/74  Pulse: 68  77  Resp: 18  15  Temp:  98.2 F (36.8 C) 97.8 F (36.6 C)  TempSrc:  Oral Oral  SpO2: 98%  100%  Weight: 77.6 kg    Height: 5\' 4"  (1.626 m)     Constitutional: appears younger than chronological age, NAD, calm Eyes: PERRL, lids and conjunctivae normal ENMT: Mucous membranes are moist. Posterior pharynx clear of any exudate or lesions. Age-appropriate dentition. Hearing appropriate Neck: normal, supple, no masses, no thyromegaly Respiratory: clear to auscultation bilaterally, no wheezing, no crackles. Normal respiratory effort. No accessory muscle use.  Cardiovascular: Regular rate and rhythm, no murmurs / rubs / gallops. No extremity edema. 2+ pedal pulses. No carotid bruits.  Abdomen: Obese abdomen, no tenderness, no masses palpated, no hepatosplenomegaly. Bowel sounds positive.  Musculoskeletal: no clubbing / cyanosis. No joint deformity upper and lower extremities. Good ROM, no contractures, no atrophy. Normal muscle tone.  Skin: no rashes, lesions, ulcers. No induration Neurologic: Sensation intact. Strength 5/5 in all 4.  Psychiatric: Normal judgment and insight. Alert and oriented x 3. Normal mood.   EKG: independently reviewed, showing sinus rhythm with a rate of 66, QTc 446  Chest x-ray on Admission: I personally reviewed and I agree with radiologist reading as below.  CT ANGIO HEAD NECK W WO CM  Result Date: 02/02/2023 CLINICAL DATA:  Neuro deficit, acute, stroke suspected EXAM: CT ANGIOGRAPHY HEAD AND NECK WITH AND WITHOUT  CONTRAST TECHNIQUE: Multidetector CT imaging of the head and neck was performed using the standard protocol during bolus administration of intravenous contrast. Multiplanar CT image reconstructions and MIPs were obtained to evaluate the vascular anatomy. Carotid stenosis measurements (when applicable) are obtained utilizing NASCET criteria, using the distal internal carotid diameter as the denominator. RADIATION DOSE REDUCTION: This exam was performed according to the departmental dose-optimization program which includes automated exposure control, adjustment of the mA and/or kV according to patient size and/or use of iterative reconstruction technique. CONTRAST:  75mL OMNIPAQUE IOHEXOL 350 MG/ML SOLN COMPARISON:  Same day CT head, same day brain MR FINDINGS: CT HEAD FINDINGS See same day CT head for intracranial findings no contrast-enhancing lesion is visualized. CTA NECK FINDINGS Aortic arch: Standard branching. Imaged portion shows no evidence of aneurysm or  dissection. No significant stenosis of the major arch vessel origins. Right carotid system: No evidence of dissection, stenosis (50% or greater), or occlusion. Left carotid system: No evidence of dissection, stenosis (50% or greater), or occlusion. Vertebral arteries: Codominant. Severe stenosis of the origin of the left vertebral artery secondary to calcified atherosclerotic plaque. On the right, no evidence of dissection, stenosis (50% or greater), or occlusion. Skeleton: Negative. Other neck: Negative. Upper chest: No focal airspace opacity. 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 Review of the MIP images confirms the above findings CTA HEAD FINDINGS Anterior circulation: No significant stenosis, proximal occlusion, aneurysm, or vascular malformation. Posterior circulation: No significant stenosis, proximal occlusion, aneurysm, or vascular malformation. Venous sinuses: As permitted by contrast timing, patent. Anatomic variants: None Review of the MIP images  confirms the above findings IMPRESSION: 1. No intracranial large vessel occlusion. 2. Severe stenosis of the origin of the left vertebral artery secondary to calcified atherosclerotic plaque. 3. No hemodynamically significant stenosis in the neck. Electronically Signed   By: Lorenza Cambridge M.D.   On: 02/02/2023 17:54   MR BRAIN WO CONTRAST  Result Date: 02/02/2023 CLINICAL DATA:  Neuro deficit, acute, stroke suspected. Left lower extremity numbness. EXAM: MRI HEAD WITHOUT CONTRAST TECHNIQUE: Multiplanar, multiecho pulse sequences of the brain and surrounding structures were obtained without intravenous contrast. COMPARISON:  Head CT 02/02/2023.  MRI brain 04/28/2015. FINDINGS: Brain: No acute infarct or hemorrhage. Old lacunar infarct in the left corona radiata. No hydrocephalus or extra-axial collection. No mass or midline shift. No foci of abnormal susceptibility. Vascular: Normal flow voids. Skull and upper cervical spine: Normal marrow signal. Sinuses/Orbits: Unremarkable. Other: None. IMPRESSION: No acute intracranial process. Electronically Signed   By: Orvan Falconer M.D.   On: 02/02/2023 17:09   CT HEAD CODE STROKE WO CONTRAST  Result Date: 02/02/2023 CLINICAL DATA:  Code stroke.  Neuro deficit, acute, stroke suspected EXAM: CT HEAD WITHOUT CONTRAST TECHNIQUE: Contiguous axial images were obtained from the base of the skull through the vertex without intravenous contrast. RADIATION DOSE REDUCTION: This exam was performed according to the departmental dose-optimization program which includes automated exposure control, adjustment of the mA and/or kV according to patient size and/or use of iterative reconstruction technique. COMPARISON:  None Available. FINDINGS: Brain: No evidence of acute large vascular territory infarct, acute hemorrhage, mass lesion, midline shift or hydrocephalus. Vascular: No hyperdense vessel identified. Skull: No acute fracture. Sinuses/Orbits: Clear sinuses.  No acute orbital  findings. Other: No mastoid effusions. ASPECTS [facility] Stroke Program Early CT Score) total score (0-10 with 10 being normal): 10. IMPRESSION: 1. No evidence of acute intracranial abnormality. 2. ASPECTS is 10. 3. Prior left basal ganglia perforator infarct. Code stroke imaging results were communicated on 02/02/2023 at 3:05 pm to provider Selina Cooley via telephone, who verbally acknowledged these results. Electronically Signed   By: Feliberto Harts M.D.   On: 02/02/2023 15:06    Labs on Admission: I have personally reviewed following labs  CBC: Recent Labs  Lab 02/02/23 1438  WBC 6.5  NEUTROABS 3.4  HGB 9.6*  HCT 30.3*  MCV 75.2*  PLT 252   Basic Metabolic Panel: Recent Labs  Lab 02/02/23 1438  NA 134*  K 3.4*  CL 98  CO2 26  GLUCOSE 96  BUN 20  CREATININE 1.13*  CALCIUM 8.9   GFR: Estimated Creatinine Clearance: 37.4 mL/min (A) (by C-G formula based on SCr of 1.13 mg/dL (H)).  Liver Function Tests: Recent Labs  Lab 02/02/23 1438  AST 27  ALT  24  ALKPHOS 89  BILITOT 0.7  PROT 7.5  ALBUMIN 3.7   Coagulation Profile: Recent Labs  Lab 02/02/23 1438  INR 1.2   CBG: Recent Labs  Lab 02/02/23 1449  GLUCAP 87   Urine analysis:    Component Value Date/Time   COLORURINE YELLOW 01/09/2017 1050   APPEARANCEUR CLEAR 01/09/2017 1050   LABSPEC 1.015 01/09/2017 1050   PHURINE 6.5 01/09/2017 1050   GLUCOSEU NEGATIVE 01/09/2017 1050   HGBUR TRACE (A) 01/09/2017 1050   BILIRUBINUR NEGATIVE 01/09/2017 1050   KETONESUR NEGATIVE 01/09/2017 1050   PROTEINUR NEGATIVE 01/09/2017 1050   UROBILINOGEN 0.2 05/16/2015 1150   NITRITE NEGATIVE 01/09/2017 1050   LEUKOCYTESUR TRACE (A) 01/09/2017 1050   This document was prepared using Dragon Voice Recognition software and may include unintentional dictation errors.  Dr. Sedalia Muta [facility]  If 7PM-7AM, please contact overnight-coverage provider If 7AM-7PM, please contact day attending provider [email or web address]  02/02/2023,  6:57 PM

## 2023-02-02 NOTE — Assessment & Plan Note (Signed)
Home atorvastatin 80 mg nightly resumed

## 2023-02-02 NOTE — Assessment & Plan Note (Signed)
Home Coreg 12.5 mg p.o. twice daily, chlorthalidone 25 mg daily, hydralazine 100 mg daily, losartan 100 mg daily not resumed on admission Hydralazine 5 mg IV every 6 hours as needed for BP greater than 220/110, 48 hours of coverage ordered AM team to resume home antihypertensive medications when the benefits outweigh the risk

## 2023-02-02 NOTE — Assessment & Plan Note (Addendum)
TIA versus stroke Neurology has been consulted and we appreciate further recommendations Aspirin 81 mg daily resumed, Plavix 75 mg daily initiated Complete echo MRI of the brain and CTA H/N w wo CM Fasting lipid and A1c ordered Permissive hypertension per neurology recommendations: maintain BP < 220/110 for 48 hours Frequent neuro vascular checks N.p.o. pending swallow study Fall precaution and aspiration precaution

## 2023-02-02 NOTE — Progress Notes (Signed)
.   Pt id band would not scan, override performed. CBG result of 77

## 2023-02-02 NOTE — Consult Note (Signed)
NEUROLOGY CONSULTATION NOTE   Date of service: February 02, 2023 Patient Name: Anne Herrera MRN:  [id number] DOB:  1936/07/08 Reason for consult: stroke code Requesting physician: Dr. 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  Last known well was 12 PM.  NIH stroke scale was 2 for facial asymmetry and left leg sensory deficit.  Head CT showed no acute process on personal review.  TNK was not administered due to symptoms being too mild to treat.  CTA was not performed due to exam not being consistent with LVO.   ROS   Per HPI: all other systems reviewed and are negative  Past History   I have reviewed the following:  Past Medical History:  Diagnosis Date   Diabetes mellitus without complication (HCC)    Gout    High cholesterol    Hypertension    Stroke (HCC)    2016   Stroke [facility])    Past Surgical History:  Procedure Laterality Date   TUBAL LIGATION     Family History  Problem Relation Age of Onset   Hypertension Other        all family members   CAD Mother    CAD Brother    Breast cancer Cousin        mat and pat cousins   Anemia Father    Anemia Son    Anemia Daughter    Social History   Socioeconomic History   Marital status: Divorced    Spouse name: Not on file   Number of children: Not on file   Years of education: Not on file   Highest education level: Not on file  Occupational History   Not on file  Tobacco Use   Smoking status: Former    Types: Cigarettes    Quit date: 09/29/1973    Years since quitting: 49.3   Smokeless tobacco: Never  Vaping Use   Vaping Use: Never used  Substance and Sexual Activity   Alcohol use: No   Drug use: No   Sexual activity: Not on file  Other Topics Concern   Not on file  Social History Narrative   Not on file    Social Determinants of Health   Financial Resource Strain: Not on file  Food Insecurity: Not on file  Transportation Needs: Not on file  Physical Activity: Not on file  Stress: Not on file  Social Connections: Not on file   Allergies  Allergen Reactions   Acetaminophen Other (See Comments)    Excessive sedation at 500mg  dose   Amlodipine Hives, Itching and Other (See Comments)    Face breaks out.   Lisinopril Swelling    Reaction:  Mouth swelling   Sulfa Antibiotics     Medications   (Not in a hospital admission)     Current Facility-Administered Medications:    sodium chloride flush (NS) 0.9 % injection 3 mL, 3 mL, Intravenous, Once, Sidney Ace, Teddy Spike, MD  Current Outpatient Medications:    acetaminophen (TYLENOL) 325 MG tablet, Take 1-2 tablets (325-650 mg total) by mouth every 4 (four) hours as needed for mild pain., Disp: , Rfl:    alum & mag hydroxide-simeth (MAALOX/MYLANTA) 200-200-20  MG/5ML suspension, Take 30 mLs by mouth every 4 (four) hours as needed for indigestion., Disp: 355 mL, Rfl: 0   aspirin EC 81 MG EC tablet, Take 1 tablet (81 mg total) by mouth daily., Disp: , Rfl:    atorvastatin (LIPITOR) 80 MG tablet, Take 1 tablet (80 mg total) by mouth daily at 6 PM., Disp: , Rfl:    carvedilol (COREG) 12.5 MG tablet, Take 1 tablet (12.5 mg total) by mouth 2 (two) times daily with a meal., Disp: , Rfl:    chlorthalidone (HYGROTON) 25 MG tablet, Take 25 mg by mouth daily., Disp: , Rfl:    cloNIDine (CATAPRES) 0.1 MG tablet, Take 0.1 mg by mouth 2 (two) times daily., Disp: , Rfl:    doxycycline (VIBRA-TABS) 100 MG tablet, Take 1 tablet (100 mg total) by mouth 2 (two) times daily., Disp: 20 tablet, Rfl: 0   ergocalciferol (VITAMIN D2) 1.25 MG (50000 UT) capsule, , Disp: , Rfl:    gentamicin cream (GARAMYCIN) 0.1 %, Apply 1 application topically 2 (two) times daily., Disp: 30 g, Rfl: 1   glipiZIDE (GLUCOTROL XL) 2.5 MG 24 hr tablet, Take 2.5 mg by mouth daily.  (Patient not taking: Reported on 09/07/2021), Disp: , Rfl:    hydrALAZINE (APRESOLINE) 10 MG tablet, Take by mouth., Disp: , Rfl:    IRON PO, Take 1 Dose by mouth daily. , Disp: , Rfl:    Lactase 9000 UNITS CHEW, Chew 1 tablet (9,000 Units total) by mouth 3 (three) times daily with meals., Disp: , Rfl: 0   loratadine (CLARITIN) 10 MG tablet, Take 1 tablet (10 mg total) by mouth daily., Disp: , Rfl:    losartan (COZAAR) 100 MG tablet, Take 100 mg by mouth daily., Disp: , Rfl:    metFORMIN (GLUCOPHAGE-XR) 500 MG 24 hr tablet, Take 1 tablet (500 mg total) by mouth 2 (two) times daily with a meal., Disp: , Rfl:    polycarbophil (FIBERCON) 625 MG tablet, Take 1 tablet (625 mg total) by mouth 2 (two) times daily., Disp: , Rfl:    potassium chloride (MICRO-K) 10 MEQ CR capsule, Take 20 mEq by mouth daily., Disp: , Rfl:    triamcinolone ointment (KENALOG) 0.1 %, Apply topically 2 (two) times daily., Disp: 30 g, Rfl: 0  Vitals   Vitals:   02/02/23 1504 02/02/23 1505  BP: (!) 172/69   Pulse: 68   Resp: 18   Temp:  98.2 F (36.8 C)  TempSrc:  Oral  SpO2: 98%   Weight: 77.6 kg   Height: 5\' 4"  (1.626 m)      Body mass index is 29.35 kg/m.  Physical Exam   Physical Exam Gen: A&O x4, NAD HEENT: Atraumatic, normocephalic;mucous membranes moist; oropharynx clear, tongue without atrophy or fasciculations. Neck: Supple, trachea midline. Resp: CTAB, no w/r/r CV: RRR, no m/g/r; nml S1 and S2. 2+ symmetric peripheral pulses. Abd: soft/NT/ND; nabs x 4 quad Extrem: Nml bulk; no cyanosis, clubbing, or edema.  Neuro: *MS: A&O x4. Follows multi-step commands.  *Speech: fluid, nondysarthric, able to name and repeat *CN:    I: Deferred   II,III: PERRLA, VFF by confrontation, optic discs unable to be visualized 2/2 pupillary constriction   III,IV,VI: EOMI w/o nystagmus, no ptosis   V: Sensation intact from V1 to V3 to LT   VII: Eyelid closure was full.  R facial asymmetry corrects with smile    VIII: Hearing intact to voice   IX,X: Voice normal, palate elevates symmetrically  XI: SCM/trap 5/5 bilat   XII: Tongue protrudes midline, no atrophy or fasciculations  *Motor:   Normal bulk.  No tremor, rigidity or bradykinesia. No drift in any extremity *Sensory: Sensory deficit to LT in LLE. Symmetric. Propioception intact bilat.  No double-simultaneous extinction.  *Coordination:  Finger-to-nose, heel-to-shin, rapid alternating motions were intact. *Reflexes:  2+ and symmetric throughout without clonus; toes down-going bilat *Gait: normal base, normal stride, normal turn. Negative Romberg.  NIHSS = 2 for facial asymmetry and sensory deficit   Premorbid mRS = 2   Labs   CBC:  Recent Labs  Lab 02/02/23 1438  WBC 6.5  NEUTROABS 3.4  HGB 9.6*  HCT 30.3*  MCV 75.2*  PLT 252    Basic Metabolic Panel:  Lab Results  Component Value Date   NA 136 01/06/2017   K 3.0 (L) 01/06/2017   CO2 26 01/06/2017   GLUCOSE 85 01/06/2017   BUN 19 01/06/2017   CREATININE 0.91 01/06/2017   CALCIUM 9.1 01/06/2017   GFRNONAA 59 (L) 01/06/2017   GFRAA >60 01/06/2017   Lipid Panel: No results found for: "LDLCALC" HgbA1c:  Lab Results  Component Value Date   HGBA1C 7.8 (H) 04/27/2015   Urine Drug Screen: No results found for: "LABOPIA", "COCAINSCRNUR", "LABBENZ", "AMPHETMU", "THCU", "LABBARB"  Alcohol Level No results found for: "ETH"  CT Head without contrast: No acute process on personal review  Impression   This is a 87 yo woman with pmhx DM2, HTN, HL, 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 TNK not administered 2/2 sx being currently too mild to treat, although she will be observed for changes until she is outside the TNK window at 1630.  Recommendations   - Recommend close monitoring in ED with vital signs and NIHSS both q 30 min until outside of the TNK window at 1630.  If neurologic exam  is worsens a stroke code should be immediately reactivated.  If exam is stable or improved at 1630 patient should at that point be admitted to the hospitalist service for stroke/TIA work-up. - Permissive HTN x48 hrs from sx onset or until stroke ruled out by MRI goal BP <220/110. PRN labetalol or hydralazine if BP above these parameters. Avoid oral antihypertensives. - MRI brain wo contrast - CTA or MRA H&N - TTE - Check A1c and LDL + add statin per guidelines - ASA 81mg  daily + plavix 75mg  daily x21 days f/b plavix 75mg  daily monotherapy after that - q4 hr neuro checks - STAT head CT for any change in neuro exam - Tele - PT/OT/SLP - Stroke education - Amb referral to neurology upon discharge   Neurology will continue to follow  ______________________________________________________________________   Thank you for the opportunity to take part in the care of this patient. If you have any further questions, please contact the neurology consultation attending.  Signed,  Bing Neighbors, MD [facility] [phone]  If 7pm- 7am, please page neurology on call as listed in AMION.  **Any copied and pasted documentation in this note was written by me in another application not billed for and pasted by me into this document.

## 2023-02-02 NOTE — Hospital Course (Signed)
Anne Herrera is a 87 year old female with history of hypertension, hyperlipidemia, non-insulin-dependent diabetes mellitus, history of CVA with residual right-sided facial asymmetry and right lower extremity weakness, who presents to the emergency department for chief concerns of left-sided lower extremity numbness while at church.  Vitals in the ED showed temperature of 98.2, respiration rate of 18, heart rate of 68, blood pressure 172/69, SpO2 of 98% on room air.  Serum sodium is 134, potassium 3.4, chloride 98, bicarb 26, BUN of 20, serum creatinine 1.13, EGFR 48, nonfasting blood glucose 96, WBC 6.5, hemoglobin 9.6, platelets of 252.  EtOH level was less than 10.  CT head wo contrast: Read as no evidence of acute intracranial abnormality.  Prior left basal cannula perforator infarct.  MRI brain wo contrast: Was read as no acute intracranial process.  ED treatment: Plavix 75 mg p.o. one-time dose

## 2023-02-02 NOTE — Assessment & Plan Note (Signed)
Baseline hemoglobin is 9.4-10 CBC without differential in the a.m.

## 2023-02-03 ENCOUNTER — Observation Stay (HOSPITAL_BASED_OUTPATIENT_CLINIC_OR_DEPARTMENT_OTHER)
Admit: 2023-02-03 | Discharge: 2023-02-03 | Disposition: A | Discharge status: Home / Self Care | Payer: Medicare PPO | Attending: Internal Medicine | Admitting: Internal Medicine

## 2023-02-03 DIAGNOSIS — R2990 Unspecified symptoms and signs involving the nervous system: Secondary | ICD-10-CM | POA: Diagnosis not present

## 2023-02-03 DIAGNOSIS — G459 Transient cerebral ischemic attack, unspecified: Secondary | ICD-10-CM

## 2023-02-03 LAB — ECHOCARDIOGRAM COMPLETE
AR max vel: 2.76 cm2
AV Area VTI: 3.13 cm2
AV Area mean vel: 2.59 cm2
AV Mean grad: 4 mmHg
AV Peak grad: 7.7 mmHg
Ao pk vel: 1.39 m/s
Area-P 1/2: 3.01 cm2
Height: 64 in
MV VTI: 2.38 cm2
S' Lateral: 2.3 cm
Weight: 2736 oz

## 2023-02-03 LAB — GLUCOSE, CAPILLARY: Glucose-Capillary: 93 mg/dL (ref 70–99)

## 2023-02-03 LAB — CBC
HCT: 30 % — ABNORMAL LOW (ref 36.0–46.0)
Hemoglobin: 9.3 g/dL — ABNORMAL LOW (ref 12.0–15.0)
MCH: 23.1 pg — ABNORMAL LOW (ref 26.0–34.0)
MCHC: 31 g/dL (ref 30.0–36.0)
MCV: 74.4 fL — ABNORMAL LOW (ref 80.0–100.0)
Platelets: 243 10*3/uL (ref 150–400)
RBC: 4.03 MIL/uL (ref 3.87–5.11)
RDW: 16.1 % — ABNORMAL HIGH (ref 11.5–15.5)
WBC: 6 10*3/uL (ref 4.0–10.5)
nRBC: 0 % (ref 0.0–0.2)

## 2023-02-03 LAB — BASIC METABOLIC PANEL
Anion gap: 9 (ref 5–15)
BUN: 18 mg/dL (ref 8–23)
CO2: 27 mmol/L (ref 22–32)
Calcium: 8.8 mg/dL — ABNORMAL LOW (ref 8.9–10.3)
Chloride: 98 mmol/L (ref 98–111)
Creatinine, Ser: 1.06 mg/dL — ABNORMAL HIGH (ref 0.44–1.00)
GFR, Estimated: 52 mL/min — ABNORMAL LOW (ref >60)
Glucose, Bld: 89 mg/dL (ref 70–99)
Potassium: 3.2 mmol/L — ABNORMAL LOW (ref 3.5–5.1)
Sodium: 134 mmol/L — ABNORMAL LOW (ref 135–145)

## 2023-02-03 LAB — LIPID PANEL
Cholesterol: 97 mg/dL (ref 0–200)
HDL: 40 mg/dL — ABNORMAL LOW (ref >40)
LDL Cholesterol: 44 mg/dL (ref 0–99)
Total CHOL/HDL Ratio: 2.4 RATIO
Triglycerides: 64 mg/dL (ref <150)
VLDL: 13 mg/dL (ref 0–40)

## 2023-02-03 LAB — HEMOGLOBIN A1C
Hgb A1c MFr Bld: 6.4 % — ABNORMAL HIGH (ref 4.8–5.6)
Mean Plasma Glucose: 136.98 mg/dL

## 2023-02-03 LAB — MAGNESIUM: Magnesium: 2 mg/dL (ref 1.7–2.4)

## 2023-02-03 MED ORDER — CLOPIDOGREL BISULFATE 75 MG PO TABS: 75.0000 mg | ORAL_TABLET | Freq: Every day | ORAL | 2 refills | Status: AC

## 2023-02-03 MED ORDER — ASPIRIN 81 MG PO TBEC: 81.0000 mg | DELAYED_RELEASE_TABLET | Freq: Every day | ORAL | Status: AC

## 2023-02-03 MED ORDER — TRIAMCINOLONE ACETONIDE 0.1 % EX OINT: TOPICAL_OINTMENT | CUTANEOUS | 0 refills | Status: AC | PRN

## 2023-02-03 MED ORDER — POTASSIUM CHLORIDE 20 MEQ PO PACK
40.0000 meq | PACK | Freq: Once | ORAL | Status: AC
  Administered 2023-02-03: 40 meq via ORAL
  Filled 2023-02-03: qty 2

## 2023-02-03 MED ORDER — MELATONIN 5 MG PO TABS
5.0000 mg | ORAL_TABLET | Freq: Once | ORAL | Status: AC
  Administered 2023-02-03: 5 mg via ORAL
  Filled 2023-02-03: qty 1

## 2023-02-03 NOTE — Plan of Care (Signed)

## 2023-02-03 NOTE — Progress Notes (Signed)
       CROSS COVER NOTE  NAME: Anne Herrera MRN: [id number] DOB : 1935-10-23    Concern as stated by nurse / staff   Message received via chat from nurse "Patient admitted with stroke like symptoms mainly numbness/pins and needles to her LLE since admission. She has a history of hypertension, hyperlipidemia, non-insulin-dependent diabetes mellitus, history of CVA with residual right-sided facial asymmetry and right lower extremity weakness,Patient complains of restless L leg due to the pins and needles sensation she has in that leg. She is requesting something to help her sleep due to the aggravation of her L leg  She also states she was recently instructed by her primary care provider to take 400mg  Magnesium BID due to low magnesium and has been taking it for a couple of days. Just notifying you due to it being new information the patient provided and she is unsure if the magnesium supplement could have affected her this way and she forgot to mention it to the doctor. I do not see a mag level from this admission"      Pertinent findings on chart review: Hypomagnesemia confirmed with [facility] level 1.4 rechecked 02/01/2023 normal at 2.4  Assessment and  Interventions   Assessment:  Plan: Mag level added to morning labs Med added to home med list by nursing       Donnie Mesa NP [facility] Cross Cover 7pm-7am - check amion for availability Pager [phone]

## 2023-02-03 NOTE — Progress Notes (Addendum)
SLP Cancellation Note  Patient Details Name: Anne Herrera MRN: [id number] DOB: 1936/06/13   Cancelled treatment:       Reason Eval/Treat Not Completed: SLP screened, no needs identified, will sign off (chart reviewed; consulted NSG then met w/ pt and NSG)  Pt denied any difficulty swallowing and is currently on a regular diet; tolerates swallowing pills w/ water per NSG. Pt had just finished her breakfast meal and stated "it was delicious but I am done now".   Pt conversed in conversation w/out expressive/receptive deficits noted; pt denied any speech-language deficits. Speech clear. A/O. Pt had conversation w/ this SLP recounting her precautions when walking(slow down, pick up my Right foot) d/t old CVA ~8 years ago which left her w/ residual Right sided weakness. She also stated she uses her walker "when I need to".  Pt has History of CVA with residual right-sided facial asymmetry and right lower extremity weakness (chronic), per MD notes. Briefly discussed the fatigue factor and how it can impact ALL muscles including the brain and speaking. Rest Breaks during demanding physical activities and full night's rest were recommended. Pt agreed. No further skilled ST services indicated as pt appears at her baseline. Pt agreed. NSG to reconsult if any change in status while admitted.      Jerilynn Som, MS, CCC-SLP Speech Language Pathologist Rehab Services; [facility] [phone] (ascom) [name] 02/03/2023, 9:53 AM

## 2023-02-03 NOTE — Evaluation (Addendum)
Physical Therapy Evaluation Patient Details Name: Madelein Mahadeo MRN: [id number] DOB: 02/13/1936 Today's Date: 02/03/2023  History of Present Illness  Pt is a 87 year old female who presents with LLE numbness, followed up with stroke workup, imaging negative. PMH of hypertension, hyperlipidemia, non-insulin-dependent diabetes mellitus, history of CVA with residual right-sided facial asymmetry, right lower extremity weakness (foot drop)  Clinical Impression  Pt seated at EOB and is A&Ox4. PTA pt was modI for using an SPC/RW for mobility and was able to complete ADL's independently. Pt lives alone but son lives 25 minutes away and daughter lives in [place]. Pt denies any pain but does report tingling in LLE. Pt able to perform transfers c/ CGA and RW. Pt able to ambulate 180 ft c/ RW and CGA progressing to supervision. Pt does not demonstrate any dermatomal, myotome, or coordination deficits. No indications for acute therapy services due to pt being near baseline PLOF and no noted deficits.      Recommendations for follow up therapy are one component of a multi-disciplinary discharge planning process, led by the attending physician.  Recommendations may be updated based on patient status, additional functional criteria and insurance authorization.  Follow Up Recommendations       Assistance Recommended at Discharge None  Patient can return home with the following       Equipment Recommendations None recommended by PT  Recommendations for Other Services       Functional Status Assessment Patient has not had a recent decline in their functional status     Precautions / Restrictions Precautions Precautions: Fall Restrictions Weight Bearing Restrictions: No      Mobility  Bed Mobility                    Transfers Overall transfer level: Needs assistance Equipment used: Rolling walker (2 wheels) Transfers: Sit to/from Stand Sit to Stand: Min guard                 Ambulation/Gait Ambulation/Gait assistance: Supervision, Min guard Gait Distance (Feet): 180 Feet Assistive device: Rolling walker (2 wheels) Gait Pattern/deviations: Steppage, Step-through pattern          Stairs            Wheelchair Mobility    Modified Rankin (Stroke Patients Only)       Balance Overall balance assessment: Needs assistance Sitting-balance support: Feet supported Sitting balance-Leahy Scale: Good     Standing balance support: During functional activity, Bilateral upper extremity supported Standing balance-Leahy Scale: Good                               Pertinent Vitals/Pain Pain Assessment Pain Assessment: No/denies pain    Home Living Family/patient expects to be discharged to:: Private residence Living Arrangements: Alone Available Help at Discharge: Family;Available PRN/intermittently;Friend(s) Type of Home: House Home Access: Level entry       Home Layout: One level Home Equipment: Shower seat;Grab bars - tub/shower;Cane - single Librarian, academic (2 wheels);BSC/3in1;Grab bars - toilet      Prior Function Prior Level of Function : Independent/Modified Independent;Driving             Mobility Comments: uses AD for mobility but does not require assistance ADLs Comments: independent with all ADL's, grocery shopping, cleaning     Hand Dominance   Dominant Hand: Left    Extremity/Trunk Assessment   Upper Extremity Assessment Upper Extremity Assessment: Overall WFL for tasks  assessed (modified testing positons to test MMT for LUE due to IV)    Lower Extremity Assessment Lower Extremity Assessment: Overall WFL for tasks assessed (Chronic RLE deficits (R foot drop), tingling in LLE but no report of sensation deficit)    Cervical / Trunk Assessment Cervical / Trunk Assessment: Normal  Communication   Communication: No difficulties  Cognition Arousal/Alertness: Awake/alert Behavior During Therapy: WFL  for tasks assessed/performed Overall Cognitive Status: Within Functional Limits for tasks assessed                                          General Comments      Exercises     Assessment/Plan    PT Assessment Patient does not need any further PT services  PT Problem List         PT Treatment Interventions      PT Goals (Current goals can be found in the Care Plan section)       Frequency       Co-evaluation               AM-PAC PT "6 Clicks" Mobility  Outcome Measure Help needed turning from your back to your side while in a flat bed without using bedrails?: None Help needed moving from lying on your back to sitting on the side of a flat bed without using bedrails?: None Help needed moving to and from a bed to a chair (including a wheelchair)?: None Help needed standing up from a chair using your arms (e.g., wheelchair or bedside chair)?: None Help needed to walk in hospital room?: None Help needed climbing 3-5 steps with a railing? : A Little 6 Click Score: 23    End of Session Equipment Utilized During Treatment: Gait belt Activity Tolerance: Patient tolerated treatment well Patient left: in chair;with call bell/phone within reach;with chair alarm set Nurse Communication: Mobility status PT Visit Diagnosis: Other symptoms and signs involving the nervous system (R29.898);Other abnormalities of gait and mobility (R26.89)    Time: 1610-9604 PT Time Calculation (min) (ACUTE ONLY): 27 min   Charges:              Lala Lund, PT, SPT  02/03/2023, 11:47 AM

## 2023-02-03 NOTE — TOC CM/SW Note (Signed)
Transition of Care [facility]) - Inpatient Brief Assessment   Patient Details  Name: Kenisha Lynds MRN: [id number] Date of Birth: [birth year]  [facility] CM/SW Contact:    Allena Katz, LCSW Phone Number: 02/03/2023, 10:04 AM   Clinical Narrative:  Pt has orders to discharge home today. Per PT pt has no HH PT needs at this time. Pt reports she is interested in additional aide services and is interested in being screened for helper bees through [facility]. Referral made to [facility]. Pt states that she also wants to think about advanced care planning and possible need for ALF. Per pt after she left rehab she went to [facility] for a month and realizes she likely will need this again soon. Pt's son lives here but her daughter lives in [place] and she reports she thought about the idea of moving with her but would rather remain at home or an ALF.     Transition of Care Asessment: Insurance and Status: Insurance coverage has been reviewed Patient has primary care physician: Yes Home environment has been reviewed: home alone Prior level of function:: independent Prior/Current Home Services: No current home services Social Determinants of Health Reivew: SDOH reviewed no interventions necessary Readmission risk has been reviewed: Yes Transition of care needs: no transition of care needs at this time

## 2023-02-03 NOTE — Progress Notes (Signed)
OT Cancellation Note  Patient Details Name: Anne Herrera MRN: [id number] DOB: 09/14/1935   Cancelled Treatment:    Reason Eval/Treat Not Completed: OT screened, no needs identified, will sign off. OT order received and chart reviewed. Pt pt and staff report, Pt has returned to baseline in regards to self care and functional mobility. She does report some continued numbness/tingling in L LE but is still WFLs for all tasks. No skilled OT need at this time. OT to complete order.   Jackquline Denmark, MS, OTR/L , CBIS ascom [phone]  02/03/23, 11:50 AM

## 2023-02-03 NOTE — Progress Notes (Signed)
*  PRELIMINARY RESULTS* Echocardiogram 2D Echocardiogram has been performed.  Anne Herrera 02/03/2023, 8:36 AM

## 2023-02-03 NOTE — Discharge Summary (Signed)
Physician Discharge Summary   Anne Herrera  female DOB: 08/09/1936  ZOX:[id number]  PCP: Leim Fabry, MD  Admit date: 02/02/2023 Discharge date: 02/03/2023  Admitted From: home Disposition:  home CODE STATUS: Full code  Hospital Course:  For full details, please see H&P, progress notes, consult notes and ancillary notes.  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   * Stroke, ruled out Severe stenosis of the origin of the left vertebral artery secondary to calcified atherosclerotic plaque MRI brain neg for acute stroke, however, CTA head/neck showed severe left vertebral artery stenosis.  Per neuro, nothing can be done about that. --pt was on ASA 81 and lipitor 80 mg daily PTA. --per neuro rec, pt to be discharged on ASA 81 and plavix 75 mg daily together for 21 days, and then plavix 75 mg monotherapy afterwards.  The plan was discussed with pt on the phone after pt left. --cont home Lipitor   Hyperlipemia Cont Home atorvastatin 80 mg nightly   Iron deficiency anemia Baseline hemoglobin is 9.4-10 --cont home iron supplement.   HTN (hypertension) --Home Coreg 12.5 mg p.o. twice daily, chlorthalidone 25 mg daily, hydralazine 100 mg BID, losartan 100 mg daily not resumed on presentation due to concern for stroke.  All to be resumed after discharge. --cont home clonidine patch.   Diabetes (HCC) Non-insulin-dependent diabetes mellitus Metformin was discontinued outpatient one week ago due to patient developing diarrhea --home glipizide to be resumed after discharge.  Hypokalemia --monitored and repleted PRN. --potassium supplement was d/c'ed PTA, per pt.   Unless noted  above, medications under "STOP" list are ones pt was not taking PTA.  Discharge Diagnoses:  Principal Problem:   Stroke-like symptom Active Problems:   TIA (transient ischemic attack)   Diabetes (HCC)   HTN (hypertension)   Iron deficiency anemia   Hyperlipemia   Vertebral artery stenosis, left     Discharge Instructions:  Allergies as of 02/03/2023       Reactions   Acetaminophen Other (See Comments)   Excessive sedation at 500mg  dose   Amlodipine Hives, Itching, Other (See Comments)   Face breaks out.   Lisinopril Swelling   Reaction:  Mouth swelling   Sulfa Antibiotics         Medication List     STOP taking these medications    cloNIDine 0.1 MG tablet Commonly known as: CATAPRES   doxycycline 100 MG tablet Commonly known as: VIBRA-TABS   gentamicin cream 0.1 % Commonly known as: GARAMYCIN   potassium chloride 10 MEQ CR capsule Commonly known as: MICRO-K       TAKE these medications    acetaminophen 325 MG tablet Commonly known as: TYLENOL Take 1-2 tablets (325-650 mg total) by mouth every 4 (four) hours as needed for mild pain.   alum & mag hydroxide-simeth 200-200-20 MG/5ML suspension Commonly known as: MAALOX/MYLANTA Take 30 mLs by mouth every 4 (four) hours as needed for indigestion.   aspirin EC 81 MG tablet Take 1 tablet (81 mg total) by mouth daily for 21 days.   atorvastatin 80 MG tablet Commonly known as: LIPITOR Take 1 tablet (80 mg total) by mouth daily at 6 PM.   carvedilol 12.5 MG tablet Commonly known as: COREG Take 1 tablet (12.5 mg total)  by mouth 2 (two) times daily with a meal.   chlorthalidone 25 MG tablet Commonly known as: HYGROTON Take 25 mg by mouth daily.   cloNIDine 0.2 mg/24hr patch Commonly known as: CATAPRES - Dosed in mg/24 hr Place 0.2 mg onto the skin once a week.   clopidogrel 75 MG tablet Commonly known as: PLAVIX Take 1 tablet (75 mg total) by mouth daily. Start taking on: February 04, 2023    ergocalciferol 1.25 MG (50000 UT) capsule Commonly known as: VITAMIN D2   glipiZIDE 2.5 MG 24 hr tablet Commonly known as: GLUCOTROL XL Take 2.5 mg by mouth daily.   hydrALAZINE 100 MG tablet Commonly known as: APRESOLINE Take 100 mg by mouth 2 (two) times daily. What changed: Another medication with the same name was removed. Continue taking this medication, and follow the directions you see here.   IRON PO Take 1 Dose by mouth daily.   Lactase 9000 units Chew Chew 1 tablet (9,000 Units total) by mouth 3 (three) times daily with meals.   loratadine 10 MG tablet Commonly known as: CLARITIN Take 1 tablet (10 mg total) by mouth daily.   losartan 100 MG tablet Commonly known as: COZAAR Take 100 mg by mouth daily.   magnesium oxide 400 MG tablet Commonly known as: MAG-OX Take 400 mg by mouth daily.   polycarbophil 625 MG tablet Commonly known as: FIBERCON Take 1 tablet (625 mg total) by mouth 2 (two) times daily.   triamcinolone ointment 0.1 % Commonly known as: KENALOG Apply topically as needed. Home med. What changed:  when to take this reasons to take this additional instructions         Follow-up Information     Leim Fabry, MD Follow up in 1 week(s).   Specialty: Family Medicine Contact information: [address] [place] [address] [phone]                 Allergies  Allergen Reactions   Acetaminophen Other (See Comments)    Excessive sedation at 500mg  dose   Amlodipine Hives, Itching and Other (See Comments)    Face breaks out.   Lisinopril Swelling    Reaction:  Mouth swelling   Sulfa Antibiotics      The results of significant diagnostics from this hospitalization (including imaging, microbiology, ancillary and laboratory) are listed below for reference.   Consultations:   Procedures/Studies: ECHOCARDIOGRAM COMPLETE  Result Date: 02/03/2023    ECHOCARDIOGRAM REPORT   Patient Name:   Anne Herrera Vari Date of  Exam: 02/03/2023 Medical Rec #:  [id number]          Height:       64.0 in Accession #:    [id number]         Weight:       171.0 lb Date of Birth:  01/17/1936         BSA:          1.830 m Patient Age:    86 years           BP:           158/70 mmHg Patient Gender: F                  HR:           57 bpm. Exam Location:  [facility] Procedure: 2D Echo, Color Doppler and Cardiac Doppler Indications:     Stroke I63.9  History:         Patient  has no prior history of Echocardiogram examinations.                  Stroke; Risk Factors:Hypertension and Diabetes.  Sonographer:     Cristela Blue Referring Phys:  [id number] AMY N COX Diagnosing Phys: Julien Nordmann MD  Sonographer Comments: Suboptimal parasternal window. IMPRESSIONS  1. Left ventricular ejection fraction, by estimation, is 60 to 65%. The left ventricle has normal function. The left ventricle has no regional wall motion abnormalities. There is mild left ventricular hypertrophy. Left ventricular diastolic parameters are consistent with Grade I diastolic dysfunction (impaired relaxation).  2. Right ventricular systolic function is normal. The right ventricular size is normal. There is normal pulmonary artery systolic pressure. The estimated right ventricular systolic pressure is 20.1 mmHg.  3. The mitral valve is normal in structure. No evidence of mitral valve regurgitation. No evidence of mitral stenosis.  4. The aortic valve is normal in structure. Aortic valve regurgitation is not visualized. No aortic stenosis is present.  5. The inferior vena cava is normal in size with greater than 50% respiratory variability, suggesting right atrial pressure of 3 mmHg. FINDINGS  Left Ventricle: Left ventricular ejection fraction, by estimation, is 60 to 65%. The left ventricle has normal function. The left ventricle has no regional wall motion abnormalities. The left ventricular internal cavity size was normal in size. There is  mild left ventricular hypertrophy. Left ventricular  diastolic parameters are consistent with Grade I diastolic dysfunction (impaired relaxation). Right Ventricle: The right ventricular size is normal. No increase in right ventricular wall thickness. Right ventricular systolic function is normal. There is normal pulmonary artery systolic pressure. The tricuspid regurgitant velocity is 1.94 m/s, and  with an assumed right atrial pressure of 5 mmHg, the estimated right ventricular systolic pressure is 20.1 mmHg. Left Atrium: Left atrial size was normal in size. Right Atrium: Right atrial size was normal in size. Pericardium: There is no evidence of pericardial effusion. Mitral Valve: The mitral valve is normal in structure. No evidence of mitral valve regurgitation. No evidence of mitral valve stenosis. MV peak gradient, 8.3 mmHg. The mean mitral valve gradient is 4.0 mmHg. Tricuspid Valve: The tricuspid valve is normal in structure. Tricuspid valve regurgitation is not demonstrated. No evidence of tricuspid stenosis. Aortic Valve: The aortic valve is normal in structure. Aortic valve regurgitation is not visualized. No aortic stenosis is present. Aortic valve mean gradient measures 4.0 mmHg. Aortic valve peak gradient measures 7.7 mmHg. Aortic valve area, by VTI measures 3.13 cm. Pulmonic Valve: The pulmonic valve was normal in structure. Pulmonic valve regurgitation is not visualized. No evidence of pulmonic stenosis. Aorta: The aortic root is normal in size and structure. Venous: The inferior vena cava is normal in size with greater than 50% respiratory variability, suggesting right atrial pressure of 3 mmHg. IAS/Shunts: No atrial level shunt detected by color flow Doppler.  LEFT VENTRICLE PLAX 2D LVIDd:         3.80 cm   Diastology LVIDs:         2.30 cm   LV e' medial:    6.53 cm/s LV PW:         1.00 cm   LV E/e' medial:  16.8 LV IVS:        1.00 cm   LV e' lateral:   6.96 cm/s LVOT diam:     2.00 cm   LV E/e' lateral: 15.8 LV SV:         94 LV SV  Index:   51  LVOT Area:     3.14 cm  RIGHT VENTRICLE RV Basal diam:  2.90 cm RV Mid diam:    2.30 cm LEFT ATRIUM             Index        RIGHT ATRIUM           Index LA diam:        3.20 cm 1.75 cm/m   RA Area:     11.70 cm LA Vol (A2C):   64.7 ml 35.35 ml/m  RA Volume:   26.00 ml  14.20 ml/m LA Vol (A4C):   47.7 ml 26.06 ml/m LA Biplane Vol: 59.8 ml 32.67 ml/m  AORTIC VALVE AV Area (Vmax):    2.76 cm AV Area (Vmean):   2.59 cm AV Area (VTI):     3.13 cm AV Vmax:           139.00 cm/s AV Vmean:          91.100 cm/s AV VTI:            0.300 m AV Peak Grad:      7.7 mmHg AV Mean Grad:      4.0 mmHg LVOT Vmax:         122.00 cm/s LVOT Vmean:        75.000 cm/s LVOT VTI:          0.299 m LVOT/AV VTI ratio: 1.00  AORTA Ao Root diam: 2.70 cm MITRAL VALVE                TRICUSPID VALVE MV Area (PHT): 3.01 cm     TR Peak grad:   15.1 mmHg MV Area VTI:   2.38 cm     TR Vmax:        194.00 cm/s MV Peak grad:  8.3 mmHg MV Mean grad:  4.0 mmHg     SHUNTS MV Vmax:       1.44 m/s     Systemic VTI:  0.30 m MV Vmean:      87.6 cm/s    Systemic Diam: 2.00 cm MV Decel Time: 252 msec MV E velocity: 110.00 cm/s MV A velocity: 112.00 cm/s MV E/A ratio:  0.98 Julien Nordmann MD Electronically signed by Julien Nordmann MD Signature Date/Time: 02/03/2023/3:41:19 PM    Final    CT ANGIO HEAD NECK W WO CM  Result Date: 02/02/2023 CLINICAL DATA:  Neuro deficit, acute, stroke suspected EXAM: CT ANGIOGRAPHY HEAD AND NECK WITH AND WITHOUT CONTRAST TECHNIQUE: Multidetector CT imaging of the head and neck was performed using the standard protocol during bolus administration of intravenous contrast. Multiplanar CT image reconstructions and MIPs were obtained to evaluate the vascular anatomy. Carotid stenosis measurements (when applicable) are obtained utilizing NASCET criteria, using the distal internal carotid diameter as the denominator. RADIATION DOSE REDUCTION: This exam was performed according to the departmental dose-optimization program which  includes automated exposure control, adjustment of the mA and/or kV according to patient size and/or use of iterative reconstruction technique. CONTRAST:  75mL OMNIPAQUE IOHEXOL 350 MG/ML SOLN COMPARISON:  Same day CT head, same day brain MR FINDINGS: CT HEAD FINDINGS See same day CT head for intracranial findings no contrast-enhancing lesion is visualized. CTA NECK FINDINGS Aortic arch: Standard branching. Imaged portion shows no evidence of aneurysm or dissection. No significant stenosis of the major arch vessel origins. Right carotid system: No evidence of dissection, stenosis (50% or greater), or occlusion. Left carotid  system: No evidence of dissection, stenosis (50% or greater), or occlusion. Vertebral arteries: Codominant. Severe stenosis of the origin of the left vertebral artery secondary to calcified atherosclerotic plaque. On the right, no evidence of dissection, stenosis (50% or greater), or occlusion. Skeleton: Negative. Other neck: Negative. Upper chest: No focal airspace opacity. 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 Review of the MIP images confirms the above findings CTA HEAD FINDINGS Anterior circulation: No significant stenosis, proximal occlusion, aneurysm, or vascular malformation. Posterior circulation: No significant stenosis, proximal occlusion, aneurysm, or vascular malformation. Venous sinuses: As permitted by contrast timing, patent. Anatomic variants: None Review of the MIP images confirms the above findings IMPRESSION: 1. No intracranial large vessel occlusion. 2. Severe stenosis of the origin of the left vertebral artery secondary to calcified atherosclerotic plaque. 3. No hemodynamically significant stenosis in the neck. Electronically Signed   By: Lorenza Cambridge M.D.   On: 02/02/2023 17:54   MR BRAIN WO CONTRAST  Result Date: 02/02/2023 CLINICAL DATA:  Neuro deficit, acute, stroke suspected. Left lower extremity numbness. EXAM: MRI HEAD WITHOUT CONTRAST TECHNIQUE: Multiplanar, multiecho  pulse sequences of the brain and surrounding structures were obtained without intravenous contrast. COMPARISON:  Head CT 02/02/2023.  MRI brain 04/28/2015. FINDINGS: Brain: No acute infarct or hemorrhage. Old lacunar infarct in the left corona radiata. No hydrocephalus or extra-axial collection. No mass or midline shift. No foci of abnormal susceptibility. Vascular: Normal flow voids. Skull and upper cervical spine: Normal marrow signal. Sinuses/Orbits: Unremarkable. Other: None. IMPRESSION: No acute intracranial process. Electronically Signed   By: Orvan Falconer M.D.   On: 02/02/2023 17:09   CT HEAD CODE STROKE WO CONTRAST  Result Date: 02/02/2023 CLINICAL DATA:  Code stroke.  Neuro deficit, acute, stroke suspected EXAM: CT HEAD WITHOUT CONTRAST TECHNIQUE: Contiguous axial images were obtained from the base of the skull through the vertex without intravenous contrast. RADIATION DOSE REDUCTION: This exam was performed according to the departmental dose-optimization program which includes automated exposure control, adjustment of the mA and/or kV according to patient size and/or use of iterative reconstruction technique. COMPARISON:  None Available. FINDINGS: Brain: No evidence of acute large vascular territory infarct, acute hemorrhage, mass lesion, midline shift or hydrocephalus. Vascular: No hyperdense vessel identified. Skull: No acute fracture. Sinuses/Orbits: Clear sinuses.  No acute orbital findings. Other: No mastoid effusions. ASPECTS [facility] Stroke Program Early CT Score) total score (0-10 with 10 being normal): 10. IMPRESSION: 1. No evidence of acute intracranial abnormality. 2. ASPECTS is 10. 3. Prior left basal ganglia perforator infarct. Code stroke imaging results were communicated on 02/02/2023 at 3:05 pm to provider Selina Cooley via telephone, who verbally acknowledged these results. Electronically Signed   By: Feliberto Harts M.D.   On: 02/02/2023 15:06   [place] LT UPPER EXTREM LTD SOFT TISSUE NON  VASCULAR  Result Date: 01/18/2023 CLINICAL DATA:  pt with a firm indurated painful area at the left bicep w/o fluctuance EXAM: ULTRASOUND LEFT UPPER EXTREMITY LIMITED TECHNIQUE: Ultrasound examination of the upper extremity soft tissues was performed in the area of clinical concern. COMPARISON:  None Available. FINDINGS: Corresponding to the area of concern in the left upper arm, there are multiple small hypoechoic subcutaneous masses, largest measures 0.6 x 0.5 x 0.6 cm. No internal vascularity. No focal fluid collection. These are in the deeper subcutaneous tissues. No relationship with the dermis. IMPRESSION: Multiple small hypoechoic subcutaneous masses corresponding to the area of concern in the left upper arm. The largest measures 0.6 x 0.5 x 0.6 cm. No internal  vascularity is a reassuring feature. Sonographic appearance is nonspecific, but this could reflect an infectious/inflammatory process. Consider clinical follow-up versus contrast enhanced MRI for further evaluation. Electronically Signed   By: Caprice Renshaw M.D.   On: 01/18/2023 14:12   US THYROID  Result Date: 01/13/2023 CLINICAL DATA:  87 year old female with enlarged thyroid EXAM: THYROID ULTRASOUND TECHNIQUE: Ultrasound examination of the thyroid gland and adjacent soft tissues was performed. COMPARISON:  None Available. FINDINGS: Parenchymal Echotexture: Normal Isthmus: 0.4 cm Right lobe: 3.4 cm x 1.6 cm x 1.3 cm Left lobe: 3.0 cm x 1.2 cm x 1.2 cm _________________________________________________________ Estimated total number of nodules >/= 1 cm: 1 Number of spongiform nodules >/=  2 cm not described below (TR1): 0 Number of mixed cystic and solid nodules >/= 1.5 cm not described below (TR2): 0 _________________________________________________________ Nodule # 2.8: Location: Isthmus; Superior Maximum size: 2.9 cm; Other 2 dimensions: 2.8 cm x 1.4 cm Composition: cannot determine (2) Echogenicity: hypoechoic (2) Shape: not taller-than-wide  (0) Margins: ill-defined (0) Echogenic foci: none (0) ACR TI-RADS total points: 4. ACR TI-RADS risk category: TR4 (4-6 points). ACR TI-RADS recommendations: Biopsy _________________________________________________________ No adenopathy IMPRESSION: Isthmic thyroid nodule meets criteria for biopsy, as designated by the newly established ACR TI-RADS criteria, and referral for biopsy is recommended. Recommendations follow those established by the new ACR TI-RADS criteria (J Am Coll Radiol 2017;14:587-595). Electronically Signed   By: Gilmer Mor D.O.   On: 01/13/2023 16:23      Labs: BNP (last 3 results) No results for input(s): "BNP" in the last 8760 hours. Basic Metabolic Panel: Recent Labs  Lab 02/02/23 1438 02/03/23 0557  NA 134* 134*  K 3.4* 3.2*  CL 98 98  CO2 26 27  GLUCOSE 96 89  BUN 20 18  CREATININE 1.13* 1.06*  CALCIUM 8.9 8.8*  MG  --  2.0   Liver Function Tests: Recent Labs  Lab 02/02/23 1438  AST 27  ALT 24  ALKPHOS 89  BILITOT 0.7  PROT 7.5  ALBUMIN 3.7   No results for input(s): "LIPASE", "AMYLASE" in the last 168 hours. No results for input(s): "AMMONIA" in the last 168 hours. CBC: Recent Labs  Lab 02/02/23 1438 02/03/23 0557  WBC 6.5 6.0  NEUTROABS 3.4  --   HGB 9.6* 9.3*  HCT 30.3* 30.0*  MCV 75.2* 74.4*  PLT 252 243   Cardiac Enzymes: No results for input(s): "CKTOTAL", "CKMB", "CKMBINDEX", "TROPONINI" in the last 168 hours. BNP: Invalid input(s): "POCBNP" CBG: Recent Labs  Lab 02/02/23 1449 02/02/23 2027 02/03/23 0729  GLUCAP 87 105* 93   D-Dimer No results for input(s): "DDIMER" in the last 72 hours. Hgb A1c Recent Labs    02/03/23 0557  HGBA1C 6.4*   Lipid Profile Recent Labs    02/03/23 0557  CHOL 97  HDL 40*  LDLCALC 44  TRIG 64  CHOLHDL 2.4   Thyroid function studies No results for input(s): "TSH", "T4TOTAL", "T3FREE", "THYROIDAB" in the last 72 hours.  Invalid input(s): "FREET3" Anemia work up No results for  input(s): "VITAMINB12", "FOLATE", "FERRITIN", "TIBC", "IRON", "RETICCTPCT" in the last 72 hours. Urinalysis    Component Value Date/Time   COLORURINE YELLOW 01/09/2017 1050   APPEARANCEUR CLEAR 01/09/2017 1050   LABSPEC 1.015 01/09/2017 1050   PHURINE 6.5 01/09/2017 1050   GLUCOSEU NEGATIVE 01/09/2017 1050   HGBUR TRACE (A) 01/09/2017 1050   BILIRUBINUR NEGATIVE 01/09/2017 1050   KETONESUR NEGATIVE 01/09/2017 1050   PROTEINUR NEGATIVE 01/09/2017 1050   UROBILINOGEN  0.2 05/16/2015 1150   NITRITE NEGATIVE 01/09/2017 1050   LEUKOCYTESUR TRACE (A) 01/09/2017 1050   Sepsis Labs Recent Labs  Lab 02/02/23 1438 02/03/23 0557  WBC 6.5 6.0   Microbiology No results found for this or any previous visit (from the past 240 hour(s)).   Total time spend on discharging this patient, including the last patient exam, discussing the hospital stay, instructions for ongoing care as it relates to all pertinent caregivers, as well as preparing the medical discharge records, prescriptions, and/or referrals as applicable, is 45 minutes.    Darlin Priestly, MD  [facility] 02/03/2023, 6:24 PM

## 2023-03-14 ENCOUNTER — Ambulatory Visit: Payer: Self-pay | Admitting: Surgery

## 2023-03-14 NOTE — H&P (Signed)
Subjective:   CC: Cyst, dermoid, arm, left [D36.7]  HPI:  Anne Herrera is a 87 y.o. female who was referred by Cherie Ouch* for evaluation of above. First noted several months ago.  Symptoms include: Pain is sharp, intermittent, over area.  Exacerbated by touch.  Alleviated by nothing specific.  Associated with numbness down her arm, mostly medial aspect   Past Medical History:  has a past medical history of Alpha thalassemia (CMS/HHS-HCC) (01/06/2023), Anemia, Cataract cortical, senile, Diabetes type 2, controlled (03/01/2011), Foot drop, right (10/19/2015), Heart murmur (06/02/2016), Hemiparesis affecting right side as late effect of cerebrovascular accident (CMS/HHS-HCC) (09/17/2015), HTN (hypertension) (10/04/2011), HTN (hypertension) (10/04/2011), Hyperlipidemia, Obesity (03/01/2011), Obstructive sleep apnea (adult) (pediatric) (03/01/2011), Pure hypercholesterolemia (03/01/2011), Stroke (CMS/HHS-HCC), and Type 2 diabetes mellitus (CMS/HHS-HCC).  Past Surgical History:  has a past surgical history that includes Tubal ligation; Colonoscopy (07/09/2004); and Colonoscopy.  Family History: family history includes Anemia in her brother, daughter, father, and son; Coronary Artery Disease (Blocked arteries around heart) in her mother; Lung cancer in her brother; Stroke in her father and mother.  Social History:  reports that she quit smoking about 49 years ago. Her smoking use included cigarettes. She has never used smokeless tobacco. She reports that she does not drink alcohol and does not use drugs.  Current Medications: has a current medication list which includes the following prescription(s): *One Touch Ultra Test Strips, atorvastatin, carvedilol, cetirizine hcl, chlorthalidone, clonidine, clopidogrel, ferrous sulfate, glipizide, hydralazine, losartan, magnesium oxide, aspirin, true metrix glucose test strip, and colchicine.  Allergies:  Allergies  Allergen Reactions   Amlodipine  Other (See Comments)    Other Reaction: FACE BROKE OUT IN WELPS   Metformin Diarrhea    Diarrhea   Amlodipine Other (See Comments), Hives and Itching    Face breaks out. Face breaks out.   Lisinopril Swelling    Reaction:  Mouth swelling   Lisinopril (Bulk) Rash   Shellfish Containing Products Itching   Sulfasalazine Rash   Tylenol [Acetaminophen] Other (See Comments)    Excessive sedation at 500mg  dose   Sulfa (Sulfonamide Antibiotics) Rash    ROS:  A 15 point review of systems was performed and pertinent positives and negatives noted in HPI   Objective:     BP (!) 150/75   Pulse 64   Ht 162.6 cm (5' 4.02")   Wt 77.8 kg (171 lb 8.3 oz)   BMI 29.43 kg/m   Constitutional :  No distress, cooperative, alert  Lymphatics/Throat:  Supple with no lymphadenopathy  Respiratory:  Clear to auscultation bilaterally  Cardiovascular:  Regular rate and rhythm  Gastrointestinal: Soft, non-tender, non-distended, no organomegaly.  Musculoskeletal: Steady gait and movement  Skin: Cool and moist, faintly palpable nodule on lateral aspect of upper arm,   Psychiatric: Normal affect, non-agitated, not confused         LABS:  N/a   RADS: CLINICAL DATA:  pt with a firm indurated painful area at the left  bicep w/o fluctuance   EXAM:  ULTRASOUND LEFT UPPER EXTREMITY LIMITED   TECHNIQUE:  Ultrasound examination of the upper extremity soft tissues was  performed in the area of clinical concern.   COMPARISON:  None Available.   FINDINGS:  Corresponding to the area of concern in the left upper arm, there  are multiple small hypoechoic subcutaneous masses, largest measures  0.6 x 0.5 x 0.6 cm. No internal vascularity. No focal fluid  collection. These are in the deeper subcutaneous tissues. No  relationship  with the dermis.   IMPRESSION:  Multiple small hypoechoic subcutaneous masses corresponding to the  area of concern in the left upper arm. The largest measures 0.6 x  0.5 x 0.6  cm. No internal vascularity is a reassuring feature.  Sonographic appearance is nonspecific, but this could reflect an  infectious/inflammatory process. Consider clinical follow-up versus  contrast enhanced MRI for further evaluation.    Electronically Signed    By: Caprice Renshaw M.D.    On: 01/18/2023 14:12   EXAM:  US EXTREMITY NONVASCULAR LEFT LIMITED   INDICATION:  87 years old Female with patient with a firm indurated painful area at the left bicep without fluctuance needs evaluation, R22.32 Localized swelling, mass and lump, left upper limb.   ADDITIONAL HISTORY:  None.   TECHNIQUE: Serial grey scale and limited color Doppler ultrasound images of the left outer bicep region were obtained in multiple planes.   COMPARISON:  None available.     FINDINGS and IMPRESSION:   Nonspecific cystic appearing focus in the subcutaneous tissues at the area of palpable concern, 5 x 5 x 4 mm;. Additional echogenic focus with a cystic component in the deeper subcutaneous tissues.. This could represent reactive, inflammatory, or infectious etiologies. Further management should be based on clinical grounds. No increased Doppler flow signal identified within.   Electronically Signed by:  Sherryll Burger, MD, [facility] Electronically Signed on:  03/07/2023 1:23 PM  Assessment:      Cyst, dermoid, arm, left [D36.7]- pt requesting removal due to uncertainty of diagnosis, understanding liklihood of malignancy is very low at this time and main complaints of arm numbness will likely not resolve proceeding with excision  Plan:     1. Cyst, dermoid, arm, left [D36.7] Discussed surgical excision.  Alternatives include continued observation.  Benefits include possible symptom relief, pathologic evaluation, improved cosmesis. Discussed the risk of surgery including recurrence, chronic pain, post-op infxn, poor cosmesis, poor/delayed wound healing, and possible re-operation to address said risks. The  risks of general anesthetic, if used, includes MI, CVA, sudden death or even reaction to anesthetic medications also discussed.  Typical post-op recovery time of 3-5 days with possible activity restrictions were also discussed.  The patient verbalized understanding and all questions were answered to the patient's satisfaction.  2. Patient has elected to proceed with surgical treatment. Procedure will be scheduled.  labs/images/medications/previous chart entries reviewed personally and relevant changes/updates noted above.

## 2023-03-28 ENCOUNTER — Other Ambulatory Visit: Payer: Medicare PPO

## 2023-04-06 ENCOUNTER — Ambulatory Visit: Admit: 2023-04-06 | Payer: Medicare PPO | Admitting: Surgery

## 2023-04-06 SURGERY — EXCISION MASS
Anesthesia: General | Site: Arm Upper | Laterality: Left

## 2023-04-18 ENCOUNTER — Ambulatory Visit (INDEPENDENT_AMBULATORY_CARE_PROVIDER_SITE_OTHER): Payer: Medicare PPO

## 2023-04-18 ENCOUNTER — Ambulatory Visit: Payer: Medicare PPO | Admitting: Podiatry

## 2023-04-18 ENCOUNTER — Encounter: Payer: Self-pay | Admitting: Podiatry

## 2023-04-18 DIAGNOSIS — S93602A Unspecified sprain of left foot, initial encounter: Secondary | ICD-10-CM

## 2023-04-18 DIAGNOSIS — M7752 Other enthesopathy of left foot: Secondary | ICD-10-CM

## 2023-04-18 DIAGNOSIS — M779 Enthesopathy, unspecified: Secondary | ICD-10-CM

## 2023-04-18 DIAGNOSIS — E119 Type 2 diabetes mellitus without complications: Secondary | ICD-10-CM | POA: Diagnosis not present

## 2023-04-18 NOTE — Progress Notes (Signed)
   Chief Complaint  Patient presents with   Foot Pain    "I fell and hurt my foot." N - pain in foot L - dorsal pain left D - 1 month O - suddenly, it's gotten much better C - swelling, pins and needles sticking in toes, Diabetic A - tennis shoes T - wear sandals, soaked it in [employer] water    HPI: 87 y.o. female presenting today for evaluation of an injury that was sustained to the left foot about 4 weeks ago.  Patient states that she fell in her bathroom at her home.  She injured her left foot and had some swelling with pain but the swelling has since subsided as well as the pain.  She does experience a pins and needle sensation to the foot however.  Overall there has been improvement over last 4 weeks.  Ambulating currently with a walker.  Past Medical History:  Diagnosis Date   Diabetes mellitus without complication (HCC)    Gout    High cholesterol    Hypertension    Stroke (HCC)    2016   Stroke [facility])     Past Surgical History:  Procedure Laterality Date   TUBAL LIGATION      Allergies  Allergen Reactions   Acetaminophen Other (See Comments)    Excessive sedation at 500mg  dose   Amlodipine Hives, Itching and Other (See Comments)    Face breaks out.   Lisinopril Swelling    Reaction:  Mouth swelling   Sulfa Antibiotics      Physical Exam: General: The patient is alert and oriented x3 in no acute distress.  Dermatology: Skin is warm, dry and supple bilateral lower extremities.   Vascular: Palpable pedal pulses bilaterally. Capillary refill within normal limits.  Mild chronic edema noted  Neurological: Grossly intact via light touch  Musculoskeletal Exam: No pedal deformities noted.  There is no tenderness with palpation.  Muscle strength 5/5 all compartments  Radiographic Exam LT foot 04/18/2023:  Normal osseous mineralization. Joint spaces preserved.  No fractures or osseous irregularities noted.  Os peroneum noted.  Impression: Negative for  fracture  Assessment/Plan of Care: 1.  Foot sprain left; improved 2.  Encounter for diabetic foot exam  -Patient evaluated.  X-rays reviewed -Comprehensive diabetic foot exam performed today -Continue wearing good supportive shoes and sandals.  Advised against going barefoot -Reassured the patient that her injury when she fell about 4 weeks ago should heal uneventfully.  She is mostly 90% better over the last 4 weeks -Return to clinic annually       Felecia Shelling, DPM [facility]  Dr. Felecia Shelling, DPM    [address] [place], [place] [address]                Office [phone]  Fax [phone]

## 2023-08-08 ENCOUNTER — Inpatient Hospital Stay: Payer: Medicare PPO | Attending: Oncology | Admitting: Oncology

## 2023-08-08 ENCOUNTER — Inpatient Hospital Stay: Payer: Medicare PPO

## 2023-08-08 ENCOUNTER — Encounter: Payer: Self-pay | Admitting: Oncology

## 2023-08-08 VITALS — BP 162/71 | HR 55 | Temp 97.6°F | Resp 19 | Wt 177.8 lb

## 2023-08-08 DIAGNOSIS — E1122 Type 2 diabetes mellitus with diabetic chronic kidney disease: Secondary | ICD-10-CM | POA: Insufficient documentation

## 2023-08-08 DIAGNOSIS — Z87891 Personal history of nicotine dependence: Secondary | ICD-10-CM | POA: Diagnosis not present

## 2023-08-08 DIAGNOSIS — N183 Chronic kidney disease, stage 3 unspecified: Secondary | ICD-10-CM | POA: Insufficient documentation

## 2023-08-08 DIAGNOSIS — I129 Hypertensive chronic kidney disease with stage 1 through stage 4 chronic kidney disease, or unspecified chronic kidney disease: Secondary | ICD-10-CM | POA: Diagnosis present

## 2023-08-08 DIAGNOSIS — D509 Iron deficiency anemia, unspecified: Secondary | ICD-10-CM

## 2023-08-08 DIAGNOSIS — Z7984 Long term (current) use of oral hypoglycemic drugs: Secondary | ICD-10-CM | POA: Diagnosis not present

## 2023-08-08 DIAGNOSIS — Z79899 Other long term (current) drug therapy: Secondary | ICD-10-CM | POA: Insufficient documentation

## 2023-08-08 DIAGNOSIS — D631 Anemia in chronic kidney disease: Secondary | ICD-10-CM | POA: Diagnosis not present

## 2023-08-08 LAB — CBC WITH DIFFERENTIAL/PLATELET
Abs Immature Granulocytes: 0.01 10*3/uL (ref 0.00–0.07)
Basophils Absolute: 0.1 10*3/uL (ref 0.0–0.1)
Basophils Relative: 1 %
Eosinophils Absolute: 0.2 10*3/uL (ref 0.0–0.5)
Eosinophils Relative: 4 %
HCT: 30.4 % — ABNORMAL LOW (ref 36.0–46.0)
Hemoglobin: 9.4 g/dL — ABNORMAL LOW (ref 12.0–15.0)
Immature Granulocytes: 0 %
Lymphocytes Relative: 40 %
Lymphs Abs: 1.9 10*3/uL (ref 0.7–4.0)
MCH: 22.6 pg — ABNORMAL LOW (ref 26.0–34.0)
MCHC: 30.9 g/dL (ref 30.0–36.0)
MCV: 73.1 fL — ABNORMAL LOW (ref 80.0–100.0)
Monocytes Absolute: 0.5 10*3/uL (ref 0.1–1.0)
Monocytes Relative: 10 %
Neutro Abs: 2.2 10*3/uL (ref 1.7–7.7)
Neutrophils Relative %: 45 %
Platelets: 240 10*3/uL (ref 150–400)
RBC: 4.16 MIL/uL (ref 3.87–5.11)
RDW: 17 % — ABNORMAL HIGH (ref 11.5–15.5)
WBC: 4.9 10*3/uL (ref 4.0–10.5)
nRBC: 0 % (ref 0.0–0.2)

## 2023-08-08 LAB — COMPREHENSIVE METABOLIC PANEL
ALT: 28 U/L (ref 0–44)
AST: 27 U/L (ref 15–41)
Albumin: 3.8 g/dL (ref 3.5–5.0)
Alkaline Phosphatase: 81 U/L (ref 38–126)
Anion gap: 9 (ref 5–15)
BUN: 15 mg/dL (ref 8–23)
CO2: 26 mmol/L (ref 22–32)
Calcium: 9 mg/dL (ref 8.9–10.3)
Chloride: 102 mmol/L (ref 98–111)
Creatinine, Ser: 1.17 mg/dL — ABNORMAL HIGH (ref 0.44–1.00)
GFR, Estimated: 46 mL/min — ABNORMAL LOW (ref >60)
Glucose, Bld: 81 mg/dL (ref 70–99)
Potassium: 3.4 mmol/L — ABNORMAL LOW (ref 3.5–5.1)
Sodium: 137 mmol/L (ref 135–145)
Total Bilirubin: 0.9 mg/dL (ref <1.2)
Total Protein: 7.3 g/dL (ref 6.5–8.1)

## 2023-08-08 LAB — IRON AND TIBC
Iron: 43 ug/dL (ref 28–170)
Saturation Ratios: 14 % (ref 10.4–31.8)
TIBC: 309 ug/dL (ref 250–450)
UIBC: 266 ug/dL

## 2023-08-08 LAB — FOLATE: Folate: 12.1 ng/mL (ref >5.9)

## 2023-08-08 LAB — TSH: TSH: 1.755 u[IU]/mL (ref 0.350–4.500)

## 2023-08-08 LAB — RETICULOCYTES
Immature Retic Fract: 12.2 % (ref 2.3–15.9)
RBC.: 4.16 MIL/uL (ref 3.87–5.11)
Retic Count, Absolute: 39.9 10*3/uL (ref 19.0–186.0)
Retic Ct Pct: 1 % (ref 0.4–3.1)

## 2023-08-08 LAB — VITAMIN B12: Vitamin B-12: 214 pg/mL (ref 180–914)

## 2023-08-08 LAB — FERRITIN: Ferritin: 90 ng/mL (ref 11–307)

## 2023-08-08 NOTE — Progress Notes (Signed)
Hematology/Oncology Consult note [facility] Telephone:([phone] [phone]  Patient Care Team: Leim Fabry, MD as PCP - General (Family Medicine) Creig Hines, MD as Consulting Physician (Oncology)   Name of the patient: Anne Herrera  [id number]  [birth year]    Reason for referral- microcytic anemia   Referring physician- Dr. Cherylann Ratel  Date of visit: 08/08/23   History of presenting illness-patient is a 87 year old female with a history of type 2 diabetes hypertension, stage III CKD who has been referred for microcytic anemia.  I have seen her in the past for iron deficiency and she was subsequently lost to follow-up.  Presently she reports some mild fatigue but denies other complaints at this time.  She is following up with nephrology for her CKD  ECOG PS- 2  Pain scale- 0   Review of systems- Review of Systems  Constitutional:  Positive for malaise/fatigue. Negative for chills, fever and weight loss.  HENT:  Negative for congestion, ear discharge and nosebleeds.   Eyes:  Negative for blurred vision.  Respiratory:  Negative for cough, hemoptysis, sputum production, shortness of breath and wheezing.   Cardiovascular:  Negative for chest pain, palpitations, orthopnea and claudication.  Gastrointestinal:  Negative for abdominal pain, blood in stool, constipation, diarrhea, heartburn, melena, nausea and vomiting.  Genitourinary:  Negative for dysuria, flank pain, frequency, hematuria and urgency.  Musculoskeletal:  Negative for back pain, joint pain and myalgias.  Skin:  Negative for rash.  Neurological:  Negative for dizziness, tingling, focal weakness, seizures, weakness and headaches.  Endo/Heme/Allergies:  Does not bruise/bleed easily.  Psychiatric/Behavioral:  Negative for depression and suicidal ideas. The patient does not have insomnia.     Allergies  Allergen Reactions   Metformin Diarrhea    Diarrhea   Acetaminophen Other  (See Comments)    Excessive sedation at 500mg  dose   Amlodipine Hives, Itching and Other (See Comments)    Face breaks out.   Lisinopril Swelling    Reaction:  Mouth swelling   Sulfa Antibiotics     Patient Active Problem List   Diagnosis Date Noted   Stroke-like symptom 02/02/2023   Hyperlipemia 02/02/2023   Vertebral artery stenosis, left 02/02/2023   Iron deficiency anemia 01/09/2017   Diabetes (HCC) 05/20/2015   HTN (hypertension) 05/20/2015   Spastic hemiplegia affecting right dominant side (HCC) 05/07/2015   Dysarthria, post-stroke 05/07/2015   Lacunar stroke of left subthalamic region [facility]) 05/06/2015   TIA (transient ischemic attack) 04/28/2015     Past Medical History:  Diagnosis Date   Diabetes mellitus without complication (HCC)    Gout    High cholesterol    Hypertension    Stroke (HCC)    2016   Stroke [facility])      Past Surgical History:  Procedure Laterality Date   TUBAL LIGATION      Social History   Socioeconomic History   Marital status: Divorced    Spouse name: Not on file   Number of children: Not on file   Years of education: Not on file   Highest education level: Not on file  Occupational History   Not on file  Tobacco Use   Smoking status: Former    Current packs/day: 0.00    Types: Cigarettes    Quit date: 09/29/1973    Years since quitting: 49.8   Smokeless tobacco: Never  Vaping Use   Vaping status: Never Used  Substance and Sexual Activity   Alcohol use: No   Drug  use: No   Sexual activity: Not Currently  Other Topics Concern   Not on file  Social History Narrative   Not on file   Social Determinants of Health   Financial Resource Strain: Not on file  Food Insecurity: No Food Insecurity (02/02/2023)   Hunger Vital Sign    Worried About Running Out of Food in the Last Year: Never true    Ran Out of Food in the Last Year: Never true  Transportation Needs: No Transportation Needs (02/02/2023)   PRAPARE - Therapist, art (Medical): No    Lack of Transportation (Non-Medical): No  Physical Activity: Unknown (07/04/2017)   Received from [facility]   Exercise Vital Sign    Days of Exercise per Week: 2 days    Minutes of Exercise per Session: Not on file  Stress: Not on file  Social Connections: Not on file  Intimate Partner Violence: Not At Risk (02/02/2023)   Humiliation, Afraid, Rape, and Kick questionnaire    Fear of Current or Ex-Partner: No    Emotionally Abused: No    Physically Abused: No    Sexually Abused: No     Family History  Problem Relation Age of Onset   Hypertension Other        all family members   CAD Mother    CAD Brother    Breast cancer Cousin        mat and pat cousins   Anemia Father    Anemia Son    Anemia Daughter      Current Outpatient Medications:    acetaminophen (TYLENOL) 325 MG tablet, Take 1-2 tablets (325-650 mg total) by mouth every 4 (four) hours as needed for mild pain., Disp: , Rfl:    alum & mag hydroxide-simeth (MAALOX/MYLANTA) 200-200-20 MG/5ML suspension, Take 30 mLs by mouth every 4 (four) hours as needed for indigestion., Disp: 355 mL, Rfl: 0   atorvastatin (LIPITOR) 80 MG tablet, Take 1 tablet (80 mg total) by mouth daily at 6 PM., Disp: , Rfl:    carvedilol (COREG) 12.5 MG tablet, Take 1 tablet (12.5 mg total) by mouth 2 (two) times daily with a meal., Disp: , Rfl:    chlorthalidone (HYGROTON) 25 MG tablet, Take 25 mg by mouth daily., Disp: , Rfl:    cloNIDine (CATAPRES - DOSED IN MG/24 HR) 0.2 mg/24hr patch, Place 0.2 mg onto the skin once a week., Disp: , Rfl:    ergocalciferol (VITAMIN D2) 1.25 MG (50000 UT) capsule, , Disp: , Rfl:    glipiZIDE (GLUCOTROL XL) 2.5 MG 24 hr tablet, Take 2.5 mg by mouth daily., Disp: , Rfl:    hydrALAZINE (APRESOLINE) 100 MG tablet, Take 100 mg by mouth 2 (two) times daily., Disp: , Rfl:    IRON PO, Take 1 Dose by mouth daily. , Disp: , Rfl:     Lactase 9000 UNITS CHEW, Chew 1 tablet (9,000 Units total) by mouth 3 (three) times daily with meals., Disp: , Rfl: 0   loratadine (CLARITIN) 10 MG tablet, Take 1 tablet (10 mg total) by mouth daily., Disp: , Rfl:    losartan (COZAAR) 100 MG tablet, Take 100 mg by mouth daily., Disp: , Rfl:    magnesium oxide (MAG-OX) 400 MG tablet, Take 400 mg by mouth daily., Disp: , Rfl:    triamcinolone ointment (KENALOG) 0.1 %, Apply topically as needed. Home med., Disp: 30 g, Rfl: 0   polycarbophil (FIBERCON)  625 MG tablet, Take 1 tablet (625 mg total) by mouth 2 (two) times daily. (Patient not taking: Reported on 08/08/2023), Disp: , Rfl:    Physical exam:  Vitals:   08/08/23 1424  Weight: 177 lb 12.8 oz (80.6 kg)   Physical Exam Constitutional:      Comments: Sitting in a wheelchair.  Appears in no acute distress  Cardiovascular:     Rate and Rhythm: Normal rate and regular rhythm.     Heart sounds: Normal heart sounds.  Pulmonary:     Effort: Pulmonary effort is normal.     Breath sounds: Normal breath sounds.  Abdominal:     General: Bowel sounds are normal.     Palpations: Abdomen is soft.  Skin:    General: Skin is warm and dry.  Neurological:     Mental Status: She is alert and oriented to person, place, and time.           Latest Ref Rng & Units 02/03/2023    5:57 AM  CMP  Glucose 70 - 99 mg/dL 89   BUN 8 - 23 mg/dL 18   Creatinine 7.25 - 1.00 mg/dL 3.66   Sodium 440 - 347 mmol/L 134   Potassium 3.5 - 5.1 mmol/L 3.2   Chloride 98 - 111 mmol/L 98   CO2 22 - 32 mmol/L 27   Calcium 8.9 - 10.3 mg/dL 8.8       Latest Ref Rng & Units 02/03/2023    5:57 AM  CBC  WBC 4.0 - 10.5 K/uL 6.0   Hemoglobin 12.0 - 15.0 g/dL 9.3   Hematocrit 42.5 - 46.0 % 30.0   Platelets 150 - 400 K/uL 243      Assessment and plan- Patient is a 87 y.o. female referred for microcytic anemia  Patient has had chronic microcytic anemia with a hemoglobin that fluctuates between 10-11.  She was tested  for thalassemia in the past and hemoglobin electrophoresis did not reveal any beta thalassemia but alpha thalassemia could not be ruled out.  Given that she has had a stable hemoglobin of 10 with persistent microcytosis I would like to get an alpha thalassemia gene testing today to rule out alpha thalassemia.  In addition to that I will also do a complete anemia workup including ferritin and iron studies B12 folate myeloma panel serum free light chains LDH and TSH.  I will tentatively see the patient back in 3 to 4 weeks time to discuss the results of blood work.  Given that she has CKD we could keep her ferritin levels more than 100 and iron saturation more than 20% and if her levels are lower than that we will offer her IV iron.   Thank you for this kind referral and the opportunity to participate in the care of this patient   Visit Diagnosis 1. Anemia of chronic kidney failure, stage 3 (moderate) (HCC)   2. Microcytic anemia     Dr. Owens Shark, MD, MPH [facility] at [facility] [phone] 08/08/2023

## 2023-08-09 LAB — HAPTOGLOBIN: Haptoglobin: 246 mg/dL (ref 41–333)

## 2023-08-09 LAB — KAPPA/LAMBDA LIGHT CHAINS
Kappa free light chain: 45.6 mg/L — ABNORMAL HIGH (ref 3.3–19.4)
Kappa, lambda light chain ratio: 2.16 — ABNORMAL HIGH (ref 0.26–1.65)
Lambda free light chains: 21.1 mg/L (ref 5.7–26.3)

## 2023-08-14 LAB — MULTIPLE MYELOMA PANEL, SERUM
Albumin SerPl Elph-Mcnc: 3.7 g/dL (ref 2.9–4.4)
Albumin/Glob SerPl: 1.2 (ref 0.7–1.7)
Alpha 1: 0.2 g/dL (ref 0.0–0.4)
Alpha2 Glob SerPl Elph-Mcnc: 0.7 g/dL (ref 0.4–1.0)
B-Globulin SerPl Elph-Mcnc: 0.9 g/dL (ref 0.7–1.3)
Gamma Glob SerPl Elph-Mcnc: 1.2 g/dL (ref 0.4–1.8)
Globulin, Total: 3.1 g/dL (ref 2.2–3.9)
IgA: 251 mg/dL (ref 64–422)
IgG (Immunoglobin G), Serum: 1358 mg/dL (ref 586–1602)
IgM (Immunoglobulin M), Srm: 63 mg/dL (ref 26–217)
Total Protein ELP: 6.8 g/dL (ref 6.0–8.5)

## 2023-08-17 LAB — ALPHA-THALASSEMIA GENOTYPR

## 2023-08-29 ENCOUNTER — Other Ambulatory Visit: Payer: Self-pay | Admitting: Family Medicine

## 2023-08-29 DIAGNOSIS — Z780 Asymptomatic menopausal state: Secondary | ICD-10-CM

## 2023-08-29 DIAGNOSIS — E1169 Type 2 diabetes mellitus with other specified complication: Secondary | ICD-10-CM

## 2023-09-15 ENCOUNTER — Inpatient Hospital Stay: Payer: Medicare PPO | Attending: Oncology | Admitting: Oncology

## 2023-09-15 ENCOUNTER — Encounter: Payer: Self-pay | Admitting: Oncology

## 2023-09-15 VITALS — BP 160/69 | HR 65 | Temp 97.8°F | Resp 18 | Wt 176.5 lb

## 2023-09-15 DIAGNOSIS — N182 Chronic kidney disease, stage 2 (mild): Secondary | ICD-10-CM | POA: Diagnosis not present

## 2023-09-15 DIAGNOSIS — D509 Iron deficiency anemia, unspecified: Secondary | ICD-10-CM | POA: Diagnosis present

## 2023-09-15 DIAGNOSIS — D563 Thalassemia minor: Secondary | ICD-10-CM

## 2023-09-15 DIAGNOSIS — Z79899 Other long term (current) drug therapy: Secondary | ICD-10-CM | POA: Insufficient documentation

## 2023-09-15 DIAGNOSIS — E538 Deficiency of other specified B group vitamins: Secondary | ICD-10-CM | POA: Insufficient documentation

## 2023-09-15 NOTE — Progress Notes (Unsigned)
Hematology/Oncology Consult note [facility]  Telephone:([phone] [phone]  Patient Care Team: Leim Fabry, MD as PCP - General (Family Medicine) Creig Hines, MD as Consulting Physician (Oncology)   Name of the patient: Anne Herrera  [id number]  [birth year]   Date of visit: 09/15/23  Diagnosis-microcytic anemia likely secondary to alpha thalassemia  Chief complaint/ Reason for visit-discuss results of blood work  Heme/Onc history: patient is a 88 year old female with a history of type 2 diabetes hypertension, stage III CKD who has been referred for microcytic anemia. I have seen her in the past for iron deficiency and she was subsequently lost to follow-up. Presently she reports some mild fatigue but denies other complaints at this time. She is following up with nephrology for her CKD   Patient had received 2 doses of Feraheme back in May 2018 and following that she developed issues with the right vision and subsequently underwent At MRI brain with and without contrast.  This showed diffuse iron deposition particularly in the leptomeningeal areas as well as deep small arterial and venous structures suspicious for superficial hemosiderosis which can cause cranial nerve symptomatology/atrophy  Results of blood work from 08/08/2023 were as follows: CBC showed white count of 4.9, H&H of 9.4/30.4 with an MCV of 73.1 and a platelet count of 240.  CMP was significant for mildly elevated creatinine of 1.1.  Ferritin levels were normal at 90 with an iron saturation of 14%.  TIBC normal.  Haptoglobin TSH and folate normal.  B12 levels mildly low at 214.  Myeloma panel unremarkable serum kappa light chain elevated at 45 with a light chain ratio of 2.1 likely secondary to underlying CKD.  Interval history-patient feels overall well and at her baseline.  Denies any new complaints at this time  ECOG PS- 2 Pain scale- 0  Review of systems- Review of  Systems  Constitutional:  Negative for chills, fever, malaise/fatigue and weight loss.  HENT:  Negative for congestion, ear discharge and nosebleeds.   Eyes:  Negative for blurred vision.  Respiratory:  Negative for cough, hemoptysis, sputum production, shortness of breath and wheezing.   Cardiovascular:  Negative for chest pain, palpitations, orthopnea and claudication.  Gastrointestinal:  Negative for abdominal pain, blood in stool, constipation, diarrhea, heartburn, melena, nausea and vomiting.  Genitourinary:  Negative for dysuria, flank pain, frequency, hematuria and urgency.  Musculoskeletal:  Negative for back pain, joint pain and myalgias.  Skin:  Negative for rash.  Neurological:  Negative for dizziness, tingling, focal weakness, seizures, weakness and headaches.  Endo/Heme/Allergies:  Does not bruise/bleed easily.  Psychiatric/Behavioral:  Negative for depression and suicidal ideas. The patient does not have insomnia.       Allergies  Allergen Reactions  . Metformin Diarrhea    Diarrhea  . Acetaminophen Other (See Comments)    Excessive sedation at 500mg  dose  . Amlodipine Hives, Itching and Other (See Comments)    Face breaks out.  . Lisinopril Swelling    Reaction:  Mouth swelling  . Sulfa Antibiotics      Past Medical History:  Diagnosis Date  . Diabetes mellitus without complication (HCC)   . Gout   . High cholesterol   . Hypertension   . Stroke (HCC)    2016  . Stroke [facility])      Past Surgical History:  Procedure Laterality Date  . TUBAL LIGATION      Social History   Socioeconomic History  . Marital status: Divorced  Spouse name: Not on file  . Number of children: Not on file  . Years of education: Not on file  . Highest education level: Not on file  Occupational History  . Not on file  Tobacco Use  . Smoking status: Former    Current packs/day: 0.00    Types: Cigarettes    Quit date: 09/29/1973    Years since quitting: 49.9  . Smokeless  tobacco: Never  Vaping Use  . Vaping status: Never Used  Substance and Sexual Activity  . Alcohol use: No  . Drug use: No  . Sexual activity: Not Currently  Other Topics Concern  . Not on file  Social History Narrative  . Not on file   Social Drivers of Health   Financial Resource Strain: Not on file  Food Insecurity: No Food Insecurity (02/02/2023)   Hunger Vital Sign   . Worried About Programme researcher, broadcasting/film/video in the Last Year: Never true   . Ran Out of Food in the Last Year: Never true  Transportation Needs: No Transportation Needs (02/02/2023)   PRAPARE - Transportation   . Lack of Transportation (Medical): No   . Lack of Transportation (Non-Medical): No  Physical Activity: Unknown (07/04/2017)   Received from [facility], [facility]   Exercise Vital Sign   . Days of Exercise per Week: 2 days   . Minutes of Exercise per Session: Not on file  Stress: Not on file  Social Connections: Not on file  Intimate Partner Violence: Not At Risk (02/02/2023)   Humiliation, Afraid, Rape, and Kick questionnaire   . Fear of Current or Ex-Partner: No   . Emotionally Abused: No   . Physically Abused: No   . Sexually Abused: No    Family History  Problem Relation Age of Onset  . Hypertension Other        all family members  . CAD Mother   . CAD Brother   . Breast cancer Cousin        mat and pat cousins  . Anemia Father   . Anemia Son   . Anemia Daughter      Current Outpatient Medications:  .  clopidogrel (PLAVIX) 75 MG tablet, Take by mouth., Disp: , Rfl:  .  acetaminophen (TYLENOL) 325 MG tablet, Take 1-2 tablets (325-650 mg total) by mouth every 4 (four) hours as needed for mild pain., Disp: , Rfl:  .  alum & mag hydroxide-simeth (MAALOX/MYLANTA) 200-200-20 MG/5ML suspension, Take 30 mLs by mouth every 4 (four) hours as needed for indigestion., Disp: 355 mL, Rfl: 0 .  atorvastatin (LIPITOR) 80 MG tablet, Take 1 tablet (80 mg total) by mouth daily  at 6 PM., Disp: , Rfl:  .  carvedilol (COREG) 12.5 MG tablet, Take 1 tablet (12.5 mg total) by mouth 2 (two) times daily with a meal., Disp: , Rfl:  .  chlorthalidone (HYGROTON) 25 MG tablet, Take 25 mg by mouth daily., Disp: , Rfl:  .  cloNIDine (CATAPRES - DOSED IN MG/24 HR) 0.2 mg/24hr patch, Place 0.2 mg onto the skin once a week., Disp: , Rfl:  .  ergocalciferol (VITAMIN D2) 1.25 MG (50000 UT) capsule, , Disp: , Rfl:  .  glipiZIDE (GLUCOTROL XL) 2.5 MG 24 hr tablet, Take 2.5 mg by mouth daily., Disp: , Rfl:  .  hydrALAZINE (APRESOLINE) 100 MG tablet, Take 100 mg by mouth 2 (two) times daily., Disp: , Rfl:  .  IRON PO, Take 1 Dose  by mouth daily. , Disp: , Rfl:  .  Lactase 9000 UNITS CHEW, Chew 1 tablet (9,000 Units total) by mouth 3 (three) times daily with meals., Disp: , Rfl: 0 .  loratadine (CLARITIN) 10 MG tablet, Take 1 tablet (10 mg total) by mouth daily., Disp: , Rfl:  .  losartan (COZAAR) 100 MG tablet, Take 100 mg by mouth daily., Disp: , Rfl:  .  magnesium oxide (MAG-OX) 400 MG tablet, Take 400 mg by mouth daily., Disp: , Rfl:  .  polycarbophil (FIBERCON) 625 MG tablet, Take 1 tablet (625 mg total) by mouth 2 (two) times daily. (Patient not taking: Reported on 08/08/2023), Disp: , Rfl:  .  triamcinolone ointment (KENALOG) 0.1 %, Apply topically as needed. Home med., Disp: 30 g, Rfl: 0  Physical exam:  Vitals:   09/15/23 1409  BP: (!) 160/69  Pulse: 65  Resp: 18  Temp: 97.8 F (36.6 C)  TempSrc: Tympanic  SpO2: 99%  Weight: 176 lb 8 oz (80.1 kg)   Physical Exam Constitutional:      Comments: Sitting in a chair appears in no acute distress  Cardiovascular:     Rate and Rhythm: Normal rate and regular rhythm.     Heart sounds: Normal heart sounds.  Pulmonary:     Effort: Pulmonary effort is normal.     Breath sounds: Normal breath sounds.  Abdominal:     General: Bowel sounds are normal.     Palpations: Abdomen is soft.  Skin:    General: Skin is warm and dry.   Neurological:     Mental Status: She is alert and oriented to person, place, and time.        Latest Ref Rng & Units 08/08/2023    3:11 PM  CMP  Glucose 70 - 99 mg/dL 81   BUN 8 - 23 mg/dL 15   Creatinine 4.09 - 1.00 mg/dL 8.11   Sodium 914 - 782 mmol/L 137   Potassium 3.5 - 5.1 mmol/L 3.4   Chloride 98 - 111 mmol/L 102   CO2 22 - 32 mmol/L 26   Calcium 8.9 - 10.3 mg/dL 9.0   Total Protein 6.5 - 8.1 g/dL 7.3   Total Bilirubin <9.5 mg/dL 0.9   Alkaline Phos 38 - 126 U/L 81   AST 15 - 41 U/L 27   ALT 0 - 44 U/L 28       Latest Ref Rng & Units 08/08/2023    3:11 PM  CBC  WBC 4.0 - 10.5 K/uL 4.9   Hemoglobin 12.0 - 15.0 g/dL 9.4   Hematocrit 62.1 - 46.0 % 30.4   Platelets 150 - 400 K/uL 240     Assessment and plan- Patient is a 88 y.o. female ***   Visit Diagnosis 1. Microcytic anemia   2. B12 deficiency      Dr. Owens Shark, MD, MPH [facility] [phone] 09/15/2023 2:13 PM

## 2023-09-16 ENCOUNTER — Encounter: Payer: Self-pay | Admitting: Oncology

## 2023-09-16 DIAGNOSIS — D563 Thalassemia minor: Secondary | ICD-10-CM | POA: Insufficient documentation

## 2023-10-20 ENCOUNTER — Other Ambulatory Visit: Payer: Self-pay | Admitting: Family Medicine

## 2023-10-20 DIAGNOSIS — Z1231 Encounter for screening mammogram for malignant neoplasm of breast: Secondary | ICD-10-CM

## 2023-11-02 ENCOUNTER — Ambulatory Visit
Admission: RE | Admit: 2023-11-02 | Discharge: 2023-11-02 | Disposition: A | Discharge status: Home / Self Care | Payer: Medicare PPO | Source: Ambulatory Visit | Attending: Family Medicine | Admitting: Family Medicine

## 2023-11-02 DIAGNOSIS — Z780 Asymptomatic menopausal state: Secondary | ICD-10-CM

## 2023-11-02 DIAGNOSIS — Z1231 Encounter for screening mammogram for malignant neoplasm of breast: Secondary | ICD-10-CM | POA: Insufficient documentation

## 2023-11-02 DIAGNOSIS — E1169 Type 2 diabetes mellitus with other specified complication: Secondary | ICD-10-CM | POA: Insufficient documentation

## 2023-12-13 ENCOUNTER — Other Ambulatory Visit: Payer: Self-pay

## 2023-12-13 DIAGNOSIS — D509 Iron deficiency anemia, unspecified: Secondary | ICD-10-CM

## 2023-12-14 ENCOUNTER — Inpatient Hospital Stay: Payer: Medicare PPO | Attending: Oncology

## 2023-12-14 DIAGNOSIS — E538 Deficiency of other specified B group vitamins: Secondary | ICD-10-CM

## 2023-12-14 DIAGNOSIS — D509 Iron deficiency anemia, unspecified: Secondary | ICD-10-CM | POA: Insufficient documentation

## 2023-12-14 LAB — CBC WITH DIFFERENTIAL (CANCER CENTER ONLY)
Abs Immature Granulocytes: 0.01 10*3/uL (ref 0.00–0.07)
Basophils Absolute: 0.1 10*3/uL (ref 0.0–0.1)
Basophils Relative: 1 %
Eosinophils Absolute: 0.2 10*3/uL (ref 0.0–0.5)
Eosinophils Relative: 4 %
HCT: 31 % — ABNORMAL LOW (ref 36.0–46.0)
Hemoglobin: 9.7 g/dL — ABNORMAL LOW (ref 12.0–15.0)
Immature Granulocytes: 0 %
Lymphocytes Relative: 37 %
Lymphs Abs: 2.2 10*3/uL (ref 0.7–4.0)
MCH: 22.7 pg — ABNORMAL LOW (ref 26.0–34.0)
MCHC: 31.3 g/dL (ref 30.0–36.0)
MCV: 72.6 fL — ABNORMAL LOW (ref 80.0–100.0)
Monocytes Absolute: 0.8 10*3/uL (ref 0.1–1.0)
Monocytes Relative: 13 %
Neutro Abs: 2.8 10*3/uL (ref 1.7–7.7)
Neutrophils Relative %: 45 %
Platelet Count: 258 10*3/uL (ref 150–400)
RBC: 4.27 MIL/uL (ref 3.87–5.11)
RDW: 15.8 % — ABNORMAL HIGH (ref 11.5–15.5)
WBC Count: 6.1 10*3/uL (ref 4.0–10.5)
nRBC: 0 % (ref 0.0–0.2)

## 2023-12-14 LAB — IRON AND TIBC
Iron: 26 ug/dL — ABNORMAL LOW (ref 28–170)
Saturation Ratios: 9 % — ABNORMAL LOW (ref 10.4–31.8)
TIBC: 297 ug/dL (ref 250–450)
UIBC: 271 ug/dL

## 2023-12-14 LAB — VITAMIN B12: Vitamin B-12: 934 pg/mL — ABNORMAL HIGH (ref 180–914)

## 2023-12-14 LAB — FERRITIN: Ferritin: 102 ng/mL (ref 11–307)

## 2024-02-29 ENCOUNTER — Other Ambulatory Visit

## 2024-02-29 ENCOUNTER — Ambulatory Visit: Admitting: Oncology

## 2024-03-15 ENCOUNTER — Ambulatory Visit: Payer: Medicare PPO | Admitting: Oncology

## 2024-03-15 ENCOUNTER — Other Ambulatory Visit: Payer: Medicare PPO

## 2024-03-25 ENCOUNTER — Emergency Department

## 2024-03-25 ENCOUNTER — Emergency Department
Admission: EM | Admit: 2024-03-25 | Discharge: 2024-03-25 | Disposition: A | Discharge status: Home / Self Care | Attending: Emergency Medicine | Admitting: Emergency Medicine

## 2024-03-25 ENCOUNTER — Other Ambulatory Visit: Payer: Self-pay

## 2024-03-25 ENCOUNTER — Encounter: Payer: Self-pay | Admitting: Emergency Medicine

## 2024-03-25 DIAGNOSIS — I10 Essential (primary) hypertension: Secondary | ICD-10-CM | POA: Insufficient documentation

## 2024-03-25 DIAGNOSIS — E119 Type 2 diabetes mellitus without complications: Secondary | ICD-10-CM | POA: Diagnosis not present

## 2024-03-25 DIAGNOSIS — Y93I9 Activity, other involving external motion: Secondary | ICD-10-CM | POA: Diagnosis not present

## 2024-03-25 DIAGNOSIS — S40812A Abrasion of left upper arm, initial encounter: Secondary | ICD-10-CM | POA: Insufficient documentation

## 2024-03-25 DIAGNOSIS — W19XXXA Unspecified fall, initial encounter: Secondary | ICD-10-CM

## 2024-03-25 DIAGNOSIS — S40012A Contusion of left shoulder, initial encounter: Secondary | ICD-10-CM | POA: Diagnosis not present

## 2024-03-25 DIAGNOSIS — S0003XA Contusion of scalp, initial encounter: Secondary | ICD-10-CM | POA: Insufficient documentation

## 2024-03-25 DIAGNOSIS — S0990XA Unspecified injury of head, initial encounter: Secondary | ICD-10-CM | POA: Diagnosis present

## 2024-03-25 DIAGNOSIS — T148XXA Other injury of unspecified body region, initial encounter: Secondary | ICD-10-CM

## 2024-03-25 NOTE — Discharge Instructions (Signed)
 Follow-up with your primary care provider if any continued problems or concerns.  The abrasions that are to your scalp and left forearm need to be cleaned daily with mild soap and water and allowed to dry completely.  You may cover these areas as needed however they will heal faster if they have air.  Do not apply Neosporin as it is a petroleum product and does not allow air to penetrate.  Watch for any signs of infection.  Continue with your regular medications and Tylenol .  You can expect to be sore for approximately 4 to 5 days after a fall like this.

## 2024-03-25 NOTE — ED Provider Notes (Signed)
 [facility] Provider Note    Event Date/Time   First MD Initiated Contact with Patient 03/25/24 1254     (approximate)   History   Fall   HPI  Anne Herrera is a 88 y.o. female   presents to the ED with complaint of a fall that occurred while she was here at the hospital.  Patient states she lost her footing getting out of the car to go to physical therapy.  Patient reports that she did hit her head but there was no loss of consciousness.  She does take anticoagulants and has a history of a CVA which is why she is here for stent physical therapy.  She also has an abrasion to her left arm and complains of her left shoulder.  There abrasions noted to the back of her head but she denies any visual changes, nausea, vomiting.  Patient has history of TIA, diabetes, hypertension, iron deficiency anemia.      Physical Exam   Triage Vital Signs: ED Triage Vitals [03/25/24 1153]  Encounter Vitals Group     BP (!) 195/79     Girls Systolic BP Percentile      Girls Diastolic BP Percentile      Boys Systolic BP Percentile      Boys Diastolic BP Percentile      Pulse Rate 65     Resp 17     Temp 98.9 F (37.2 C)     Temp Source Oral     SpO2 97 %     Weight 177 lb (80.3 kg)     Height 5' 4 (1.626 m)     Head Circumference      Peak Flow      Pain Score 8     Pain Loc      Pain Education      Exclude from Growth Chart     Most recent vital signs: Vitals:   03/25/24 1153 03/25/24 1502  BP: (!) 195/79 (!) 175/74  Pulse: 65 64  Resp: 17 18  Temp: 98.9 F (37.2 C) 98.3 F (36.8 C)  SpO2: 97% 100%     General: Awake, no distress.  Alert, talkative, able to answer questions appropriately. CV:  Good peripheral perfusion.  Heart regular rate and rhythm. Resp:  Normal effort.  Lungs clear bilaterally. Abd:  No distention.  Other:  Patient has multiple superficial abrasions to the posterior scalp without noted foreign body or active bleeding.   This was cleaned with normal saline and gauze.  There is some soft tissue edema present.  Cervical spine is nontender to palpation posteriorly.  Skin is intact.  Range of motion is slow and guarded secondary to patient complaining of being stiff at this time.  Left shoulder range of motion is reduced secondary to increased pain with range of motion but no crepitus noted.  There is superficial abrasion to her left forearm dorsal aspect.  No active bleeding and no foreign bodies were noted in this area.  No tenderness on palpation of the bony prominences and motor or sensory function intact.  No point tenderness on palpation of the lower extremities and range of motion is without restriction.  Skin is intact.  No discoloration noted.   ED Results / Procedures / Treatments   Labs (all labs ordered are listed, but only abnormal results are displayed) Labs Reviewed - No data to display    RADIOLOGY  CT head and cervical spine per radiology  was negative for acute intracranial abnormality or cervical fracture or subluxation. Left shoulder x-ray images were reviewed and interpreted by myself independent of the radiologist and was negative for fracture or dislocation.   PROCEDURES:  Critical Care performed:   Procedures   MEDICATIONS ORDERED IN ED: Medications - No data to display   IMPRESSION / MDM / ASSESSMENT AND PLAN / ED COURSE  I reviewed the triage vital signs and the nursing notes.   Differential diagnosis includes, but is not limited to, head injury, scalp contusion, abrasions, fracture, left shoulder dislocation, fracture, contusion, shoulder sprain secondary to mechanical fall.   88 year old female presents to the ED after a fall that occurred here at the hospital with her getting out of her car.  Patient was here for physical therapy.  She denies any loss of consciousness but did hit the back of her head and has some abrasions.  X-ray of her left shoulder and CT of the head and  cervical spine were negative for acute trauma.  Patient is aware that she should watch these areas for any signs of infection and follow-up with her PCP or if any severe worsening of her symptoms return to the emergency department.    Patient's presentation is most consistent with acute presentation with potential threat to life or bodily function.  FINAL CLINICAL IMPRESSION(S) / ED DIAGNOSES   Final diagnoses:  Contusion of scalp, initial encounter  Abrasion of skin  Fall, initial encounter  Contusion of left shoulder, initial encounter     Rx / DC Orders   ED Discharge Orders     None        Note:  This document was prepared using Dragon voice recognition software and may include unintentional dictation errors.   Saunders Shona CROME, PA-C 03/25/24 1558    Claudene Rover, MD 03/26/24 [phone]

## 2024-03-25 NOTE — ED Triage Notes (Signed)
 Patient to ED via POV for a fall. PT reports she was getting out of the car and lost her footing causing her to fall. PT reports hitting head- denies LOC but does take blood thinners. Abrasion noted to left arm. Hx: HTN- took BP meds on the way here. Laceration noted to back of head- bleeding controlled.

## 2024-04-01 ENCOUNTER — Ambulatory Visit: Payer: Self-pay | Admitting: Oncology

## 2024-04-01 ENCOUNTER — Inpatient Hospital Stay: Attending: Oncology

## 2024-04-01 ENCOUNTER — Inpatient Hospital Stay: Admitting: Nurse Practitioner

## 2024-04-01 ENCOUNTER — Encounter: Payer: Self-pay | Admitting: Nurse Practitioner

## 2024-04-01 VITALS — BP 125/52 | HR 67 | Temp 98.6°F | Resp 16 | Wt 176.0 lb

## 2024-04-01 DIAGNOSIS — Z87891 Personal history of nicotine dependence: Secondary | ICD-10-CM | POA: Diagnosis not present

## 2024-04-01 DIAGNOSIS — D563 Thalassemia minor: Secondary | ICD-10-CM

## 2024-04-01 DIAGNOSIS — D509 Iron deficiency anemia, unspecified: Secondary | ICD-10-CM

## 2024-04-01 DIAGNOSIS — E538 Deficiency of other specified B group vitamins: Secondary | ICD-10-CM | POA: Insufficient documentation

## 2024-04-01 DIAGNOSIS — Z803 Family history of malignant neoplasm of breast: Secondary | ICD-10-CM | POA: Insufficient documentation

## 2024-04-01 LAB — CBC (CANCER CENTER ONLY)
HCT: 27.6 % — ABNORMAL LOW (ref 36.0–46.0)
Hemoglobin: 8.9 g/dL — ABNORMAL LOW (ref 12.0–15.0)
MCH: 22.8 pg — ABNORMAL LOW (ref 26.0–34.0)
MCHC: 32.2 g/dL (ref 30.0–36.0)
MCV: 70.8 fL — ABNORMAL LOW (ref 80.0–100.0)
Platelet Count: 254 K/uL (ref 150–400)
RBC: 3.9 MIL/uL (ref 3.87–5.11)
RDW: 15.7 % — ABNORMAL HIGH (ref 11.5–15.5)
WBC Count: 5.4 K/uL (ref 4.0–10.5)
nRBC: 0 % (ref 0.0–0.2)

## 2024-04-01 LAB — FERRITIN: Ferritin: 123 ng/mL (ref 11–307)

## 2024-04-01 LAB — IRON AND TIBC
Iron: 29 ug/dL (ref 28–170)
Saturation Ratios: 10 % — ABNORMAL LOW (ref 10.4–31.8)
TIBC: 290 ug/dL (ref 250–450)
UIBC: 261 ug/dL

## 2024-04-01 NOTE — Progress Notes (Signed)
 Hematology/Oncology Consult Note [facility]  Telephone:([phone] [phone]  Patient Care Team: Jyl Railing, MD as PCP - General (Family Medicine) Melanee Annah BROCKS, MD as Consulting Physician (Oncology)   Name of the patient: Anne Herrera  [id number]  [birth year]   Date of visit: 04/01/24  Diagnosis- microcytic anemia likely secondary to alpha thalassemia  Chief complaint/ Reason for visit- routine follow up  Heme/Onc history: patient is a 88 year old female with a history of type 2 diabetes hypertension, stage III CKD who has been referred for microcytic anemia. I have seen her in the past for iron deficiency and she was subsequently lost to follow-up. Presently she reports some mild fatigue but denies other complaints at this time. She is following up with nephrology for her CKD   Patient had received 2 doses of Feraheme  back in May 2018 and following that she developed issues with the right vision and subsequently underwent At MRI brain with and without contrast.  This showed diffuse iron deposition particularly in the leptomeningeal areas as well as deep small arterial and venous structures suspicious for superficial hemosiderosis which can cause cranial nerve symptomatology/atrophy  Results of blood work from 08/08/2023 were as follows: CBC showed white count of 4.9, H&H of 9.4/30.4 with an MCV of 73.1 and a platelet count of 240.  CMP was significant for mildly elevated creatinine of 1.1.  Ferritin levels were normal at 90 with an iron saturation of 14%.  TIBC normal.  Haptoglobin TSH and folate normal.  B12 levels mildly low at 214.  Myeloma panel unremarkable serum kappa light chain elevated at 45 with a light chain ratio of 2.1 likely secondary to underlying CKD.  Interval history- Anne Herrera is a 88 y.o. female who returns to clinic for routine follow up.  She suffered a fall last week.  Denies significant injury but continues to feel  sore.  Denies black or bloody stools.  No vaginal bleeding.  No hematuria.  Energy is stable. She otherwise denies complaints.  ECOG PS- 2 Pain scale- 0  Review of systems- Review of Systems  Constitutional:  Negative for chills, fever, malaise/fatigue and weight loss.  HENT:  Negative for congestion, ear discharge and nosebleeds.   Eyes:  Negative for blurred vision.  Respiratory:  Negative for cough, hemoptysis, sputum production, shortness of breath and wheezing.   Cardiovascular:  Negative for chest pain, palpitations, orthopnea and claudication.  Gastrointestinal:  Negative for abdominal pain, blood in stool, constipation, diarrhea, heartburn, melena, nausea and vomiting.  Genitourinary:  Negative for dysuria, flank pain, frequency, hematuria and urgency.  Musculoskeletal:  Positive for falls. Negative for back pain, joint pain and myalgias.  Skin:  Negative for rash.  Neurological:  Negative for dizziness, tingling, focal weakness, seizures, weakness and headaches.  Endo/Heme/Allergies:  Does not bruise/bleed easily.  Psychiatric/Behavioral:  Negative for depression and suicidal ideas. The patient does not have insomnia.     Allergies  Allergen Reactions   Metformin  Diarrhea    Diarrhea   Acetaminophen  Other (See Comments)    Excessive sedation at 500mg  dose   Amlodipine Hives, Itching and Other (See Comments)    Face breaks out.   Lisinopril Swelling    Reaction:  Mouth swelling   Shellfish Allergy Itching   Sulfa Antibiotics    Past Medical History:  Diagnosis Date   Diabetes mellitus without complication (HCC)    Gout    High cholesterol    Hypertension    Stroke (HCC)  2016   Stroke [facility])    Past Surgical History:  Procedure Laterality Date   TUBAL LIGATION     Social History   Socioeconomic History   Marital status: Divorced    Spouse name: Not on file   Number of children: Not on file   Years of education: Not on file   Highest education level: Not  on file  Occupational History   Not on file  Tobacco Use   Smoking status: Former    Current packs/day: 0.00    Types: Cigarettes    Quit date: 09/29/1973    Years since quitting: 50.5   Smokeless tobacco: Never  Vaping Use   Vaping status: Never Used  Substance and Sexual Activity   Alcohol use: No   Drug use: No   Sexual activity: Not Currently  Other Topics Concern   Not on file  Social History Narrative   Not on file   Social Drivers of Health   Financial Resource Strain: Not on file  Food Insecurity: No Food Insecurity (02/02/2023)   Hunger Vital Sign    Worried About Running Out of Food in the Last Year: Never true    Ran Out of Food in the Last Year: Never true  Transportation Needs: No Transportation Needs (02/02/2023)   PRAPARE - Administrator, Civil Service (Medical): No    Lack of Transportation (Non-Medical): No  Physical Activity: Unknown (07/04/2017)   Received from [facility]   Exercise Vital Sign    Days of Exercise per Week: 2 days    Minutes of Exercise per Session: Not on file  Stress: Not on file  Social Connections: Not on file  Intimate Partner Violence: Not At Risk (02/02/2023)   Humiliation, Afraid, Rape, and Kick questionnaire    Fear of Current or Ex-Partner: No    Emotionally Abused: No    Physically Abused: No    Sexually Abused: No   Family History  Problem Relation Age of Onset   Hypertension Other        all family members   CAD Mother    CAD Brother    Breast cancer Cousin        mat and pat cousins   Anemia Father    Anemia Son    Anemia Daughter     Current Outpatient Medications:    acetaminophen  (TYLENOL ) 325 MG tablet, Take 1-2 tablets (325-650 mg total) by mouth every 4 (four) hours as needed for mild pain., Disp: , Rfl:    alum & mag hydroxide-simeth (MAALOX/MYLANTA) 200-200-20 MG/5ML suspension, Take 30 mLs by mouth every 4 (four) hours as needed for indigestion., Disp: 355 mL, Rfl: 0    atorvastatin  (LIPITOR ) 80 MG tablet, Take 1 tablet (80 mg total) by mouth daily at 6 PM., Disp: , Rfl:    carvedilol  (COREG ) 12.5 MG tablet, Take 1 tablet (12.5 mg total) by mouth 2 (two) times daily with a meal., Disp: , Rfl:    chlorthalidone  (HYGROTON ) 25 MG tablet, Take 25 mg by mouth daily., Disp: , Rfl:    cloNIDine (CATAPRES - DOSED IN MG/24 HR) 0.2 mg/24hr patch, Place 0.2 mg onto the skin once a week., Disp: , Rfl:    clopidogrel  (PLAVIX ) 75 MG tablet, Take by mouth., Disp: , Rfl:    ergocalciferol (VITAMIN D2) 1.25 MG (50000 UT) capsule, , Disp: , Rfl:    glipiZIDE  (GLUCOTROL  XL) 2.5 MG 24 hr tablet, Take 2.5 mg by mouth daily.,  Disp: , Rfl:    hydrALAZINE  (APRESOLINE ) 100 MG tablet, Take 100 mg by mouth 2 (two) times daily., Disp: , Rfl:    IRON PO, Take 1 Dose by mouth daily. , Disp: , Rfl:    Lactase 9000 UNITS CHEW, Chew 1 tablet (9,000 Units total) by mouth 3 (three) times daily with meals., Disp: , Rfl: 0   loratadine  (CLARITIN ) 10 MG tablet, Take 1 tablet (10 mg total) by mouth daily., Disp: , Rfl:    losartan  (COZAAR ) 100 MG tablet, Take 100 mg by mouth daily., Disp: , Rfl:    triamcinolone  ointment (KENALOG ) 0.1 %, Apply topically as needed. Home med., Disp: 30 g, Rfl: 0   polycarbophil (FIBERCON) 625 MG tablet, Take 1 tablet (625 mg total) by mouth 2 (two) times daily. (Patient not taking: Reported on 04/01/2024), Disp: , Rfl:   Physical exam:  Vitals:   04/01/24 1347  BP: (!) 125/52  Pulse: 67  Resp: 16  Temp: 98.6 F (37 C)  TempSrc: Tympanic  SpO2: 100%  Weight: 176 lb (79.8 kg)   Physical Exam Vitals reviewed.  Constitutional:      Appearance: She is not ill-appearing.     Comments: Sitting in a chair appears in no acute distress  Cardiovascular:     Rate and Rhythm: Normal rate and regular rhythm.  Abdominal:     General: There is no distension.     Palpations: Abdomen is soft.  Musculoskeletal:        General: No deformity.     Comments: Chronic weakness  of the right foot poststroke  Skin:    General: Skin is warm and dry.     Coloration: Skin is not pale.     Findings: No bruising.  Neurological:     Mental Status: She is alert and oriented to person, place, and time.  Psychiatric:        Mood and Affect: Mood normal.        Behavior: Behavior normal.       Latest Ref Rng & Units 04/01/2024    1:29 PM  CBC  WBC 4.0 - 10.5 K/uL 5.4   Hemoglobin 12.0 - 15.0 g/dL 8.9   Hematocrit 63.9 - 46.0 % 27.6   Platelets 150 - 400 K/uL 254       Component Value Date/Time   IRON 26 (L) 12/14/2023 1254   TIBC 297 12/14/2023 1254   FERRITIN 102 12/14/2023 1254   IRONPCTSAT 9 (L) 12/14/2023 1254   Lab Results  Component Value Date   VITAMINB12 934 (H) 12/14/2023     Assessment and plan- Patient is a 88 y.o. female who returns to clinic for follow up of:   Alpha thalassemia Anemia-baseline microcytic with hemoglobin around 10.  Alpha thalassemia testing showed that she has alpha thalassemia trait which explains her chronic mild to moderate anemia and microcytosis.  Additionally, she has mild CKD.  Iron studies today are pending.  Previously, ferritin was 90 and iron saturation was mildly low at 14%.  She previously received IV iron and developed problems with the vision in her right eye.  MRI brain at that time showed diffuse iron deposition in the leptomeningeal areas consistent with superficial hemosiderosis.  Therefore, we have avoided IV iron in absence of overt iron deficiency.  Today, her hemoglobin is dropped to 8.9.  Ferritin and iron studies are still pending.  Her kidney disease has not significantly changed so inclined to monitor. She denies occult blood loss.  B12 Deficiency-previously, her B12 was mildly low at less than 300.  She takes oral B12 1000 mcg daily.  Last check her B12 level was greater than 900.  Continue oral B12. Fall- patient suffered a trip and fall and was seen in the emergency room on 03/25/2024.  She continues to  recover.  No obvious blood loss or significant bruising.  Disposition:  3 mo- lab, Dr Melanee- la   Visit Diagnosis 1. Alpha thalassemia minor trait    Tinnie Dawn, DNP, AGNP-C, [facility] at [facility] [phone] (clinic) 04/01/2024

## 2024-04-02 ENCOUNTER — Telehealth: Payer: Self-pay | Admitting: *Deleted

## 2024-04-02 NOTE — Telephone Encounter (Signed)
 I called the patient that she will not get iron due to the eye issues. Going to get appt 6 weeks and the 12 weeks will get labs and then see md .  Duwaine will call and give her the new dates

## 2024-04-02 NOTE — Telephone Encounter (Signed)
 Per Dr. Melanee Lanius saw her today. I would hold off on iv iron given her prior eye issues. Repeat cbc bmp in 6 weeks and 12 weeks. I will see her in 12 weeks and decide what to do if she needs epo etc.  Outbound call; voice message left.

## 2024-04-02 NOTE — Telephone Encounter (Signed)
-----   Message from Annah JAYSON Skene sent at 04/01/2024  3:22 PM EDT ----- You saw her today. I would hold off on iv iron given her prior eye issues. Repeat cbc bmp in 6 weeks and 12 weeks. I will see her in 12 weeks and decide what to do if she needs epo etc ----- Message ----- From: Interface, Lab In [place] Sent: 04/01/2024   1:41 PM EDT To: Annah JAYSON Skene, MD

## 2024-04-04 ENCOUNTER — Telehealth: Payer: Self-pay | Admitting: *Deleted

## 2024-04-04 NOTE — Telephone Encounter (Signed)
 Jereld and Ms. Marice Guidone is trying to get up with each other but today Jereld had to leave early for a sick child so she can check with her in the morning.  Phone number for Ms. Obriant [phone]

## 2024-04-04 NOTE — Telephone Encounter (Signed)
 Left another message to call back center.

## 2024-04-17 ENCOUNTER — Encounter: Payer: Self-pay | Admitting: Oncology

## 2024-05-14 ENCOUNTER — Inpatient Hospital Stay: Attending: Oncology

## 2024-05-14 ENCOUNTER — Ambulatory Visit: Payer: Self-pay | Admitting: Oncology

## 2024-05-14 DIAGNOSIS — D509 Iron deficiency anemia, unspecified: Secondary | ICD-10-CM | POA: Insufficient documentation

## 2024-05-14 LAB — BASIC METABOLIC PANEL - CANCER CENTER ONLY
Anion gap: 9 (ref 5–15)
BUN: 21 mg/dL (ref 8–23)
CO2: 22 mmol/L (ref 22–32)
Calcium: 8.9 mg/dL (ref 8.9–10.3)
Chloride: 96 mmol/L — ABNORMAL LOW (ref 98–111)
Creatinine: 1.1 mg/dL — ABNORMAL HIGH (ref 0.44–1.00)
GFR, Estimated: 49 mL/min — ABNORMAL LOW (ref >60)
Glucose, Bld: 108 mg/dL — ABNORMAL HIGH (ref 70–99)
Potassium: 3.3 mmol/L — ABNORMAL LOW (ref 3.5–5.1)
Sodium: 127 mmol/L — ABNORMAL LOW (ref 135–145)

## 2024-05-14 LAB — CBC (CANCER CENTER ONLY)
HCT: 29.3 % — ABNORMAL LOW (ref 36.0–46.0)
Hemoglobin: 9.2 g/dL — ABNORMAL LOW (ref 12.0–15.0)
MCH: 22.4 pg — ABNORMAL LOW (ref 26.0–34.0)
MCHC: 31.4 g/dL (ref 30.0–36.0)
MCV: 71.3 fL — ABNORMAL LOW (ref 80.0–100.0)
Platelet Count: 274 K/uL (ref 150–400)
RBC: 4.11 MIL/uL (ref 3.87–5.11)
RDW: 15.9 % — ABNORMAL HIGH (ref 11.5–15.5)
WBC Count: 5.9 K/uL (ref 4.0–10.5)
nRBC: 0 % (ref 0.0–0.2)

## 2024-05-14 NOTE — Telephone Encounter (Signed)
 Per Dr. Melanee Please have her follow-up with her PCP for chronic hyponatremia and hypokalemia.  Detailed message left for patient indicating the above.

## 2024-05-14 NOTE — Telephone Encounter (Signed)
-----   Message from Annah JAYSON Skene sent at 05/14/2024  2:07 PM EDT ----- Please have her follow-up with her PCP for chronic hyponatremia and hypokalemia ----- Message ----- From: Interface, Lab In [place] Sent: 05/14/2024   1:40 PM EDT To: Annah JAYSON Skene, MD

## 2024-05-17 ENCOUNTER — Telehealth: Payer: Self-pay | Admitting: *Deleted

## 2024-05-17 NOTE — Telephone Encounter (Signed)
 I sent the message to [facility] this morning and it was Dr. Melanee who asked for them to follow-up with that and get those 2 things fix the sodium and the potassium.  Daughter says she will get over it right now today

## 2024-06-24 ENCOUNTER — Other Ambulatory Visit

## 2024-06-24 ENCOUNTER — Ambulatory Visit: Admitting: Oncology

## 2024-06-28 ENCOUNTER — Encounter: Payer: Self-pay | Admitting: Oncology

## 2024-07-01 ENCOUNTER — Inpatient Hospital Stay: Attending: Oncology

## 2024-07-01 ENCOUNTER — Encounter: Payer: Self-pay | Admitting: Oncology

## 2024-07-01 ENCOUNTER — Inpatient Hospital Stay (HOSPITAL_BASED_OUTPATIENT_CLINIC_OR_DEPARTMENT_OTHER): Admitting: Oncology

## 2024-07-01 VITALS — BP 170/74 | HR 72 | Temp 97.1°F | Resp 18 | Ht 64.0 in | Wt 174.4 lb

## 2024-07-01 DIAGNOSIS — D509 Iron deficiency anemia, unspecified: Secondary | ICD-10-CM

## 2024-07-01 LAB — CBC (CANCER CENTER ONLY)
HCT: 34.2 % — ABNORMAL LOW (ref 36.0–46.0)
Hemoglobin: 10.7 g/dL — ABNORMAL LOW (ref 12.0–15.0)
MCH: 22.4 pg — ABNORMAL LOW (ref 26.0–34.0)
MCHC: 31.3 g/dL (ref 30.0–36.0)
MCV: 71.7 fL — ABNORMAL LOW (ref 80.0–100.0)
Platelet Count: 260 K/uL (ref 150–400)
RBC: 4.77 MIL/uL (ref 3.87–5.11)
RDW: 16.4 % — ABNORMAL HIGH (ref 11.5–15.5)
WBC Count: 6.4 K/uL (ref 4.0–10.5)
nRBC: 0 % (ref 0.0–0.2)

## 2024-07-01 LAB — BASIC METABOLIC PANEL - CANCER CENTER ONLY
Anion gap: 12 (ref 5–15)
BUN: 20 mg/dL (ref 8–23)
CO2: 22 mmol/L (ref 22–32)
Calcium: 8.8 mg/dL — ABNORMAL LOW (ref 8.9–10.3)
Chloride: 92 mmol/L — ABNORMAL LOW (ref 98–111)
Creatinine: 1.05 mg/dL — ABNORMAL HIGH (ref 0.44–1.00)
GFR, Estimated: 52 mL/min — ABNORMAL LOW (ref >60)
Glucose, Bld: 90 mg/dL (ref 70–99)
Potassium: 3 mmol/L — ABNORMAL LOW (ref 3.5–5.1)
Sodium: 126 mmol/L — ABNORMAL LOW (ref 135–145)

## 2024-07-01 NOTE — Progress Notes (Signed)
 Hematology/Oncology Consult note [facility]  [phone] [phone]  Patient Care Team: Jyl Railing, MD as PCP - General (Family Medicine) Anne Annah BROCKS, MD as Consulting Physician (Oncology)   Name of the patient: Anne Herrera  [id number]  June 23, 1936   Date of visit: 07/01/24  Diagnosis- microcytic anemia likely secondary to alpha thalassemia   Chief complaint/ Reason for visit-routine follow-up of anemia  Heme/Onc history: patient is a 88 year old female with a history of type 2 diabetes hypertension, stage III CKD who has been referred for microcytic anemia. I have seen her in the past for iron deficiency and she was subsequently lost to follow-up. Presently she reports some mild fatigue but denies other complaints at this time. She is following up with nephrology for her CKD    Patient had received 2 doses of Feraheme  back in May 2018 and following that she developed issues with the right vision and subsequently underwent At MRI brain with and without contrast.  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  CMP was significant for mildly elevated creatinine of 1.1.  Ferritin levels were normal at 90 with an iron saturation of 14%.  TIBC normal.  Haptoglobin TSH and folate normal.  B12 levels mildly low at 214.  Myeloma panel unremarkable serum kappa light chain elevated at 45 with a light chain ratio of 2.1 likely secondary to underlying CKD.  Interval history- Discussed the use of AI scribe software for clinical note transcription with the patient, who gave verbal consent to proceed.   Anne Herrera  Anne Herrera is an 88 year old female with iron deficiency and thalassemia who presents for follow-up of her hematologic conditions.  Her hemoglobin level has improved over the past few months, currently at 10.7 g/dL, up from 9.2 g/dL a month ago and 8.9 g/dL three months ago. Iron studies from August showed a ferritin level of 123 ng/mL and an iron saturation of 10%.  She has experienced vision issues following IV iron administration in the past. She is currently taking oral iron supplements, which cause her stool to turn green. No blood in her stools.  She had a fall in August but reports no other falls since then. Her energy levels are described as okay, and she feels she is in 'great health' for her age.      ECOG PS- 2 Pain scale- 0  Review of systems- Review of Systems  Constitutional:  Negative for chills, fever, malaise/fatigue and weight loss.  HENT:  Negative for congestion, ear discharge and nosebleeds.   Eyes:  Negative for blurred vision.  Respiratory:  Negative for cough, hemoptysis, sputum production, shortness of breath and wheezing.   Cardiovascular:  Negative for chest pain, palpitations, orthopnea and claudication.  Gastrointestinal:  Negative for abdominal pain, blood in stool, constipation, diarrhea, heartburn, melena, nausea and vomiting.  Genitourinary:  Negative for dysuria, flank pain, frequency, hematuria and urgency.  Musculoskeletal:  Positive for falls. Negative for back pain, joint pain and myalgias.  Skin:  Negative for rash.  Neurological:  Negative for dizziness, tingling, focal weakness, seizures, weakness and headaches.  Endo/Heme/Allergies:  Does not bruise/bleed easily.  Psychiatric/Behavioral:  Negative for  depression and suicidal ideas. The patient does not have insomnia.       Allergies  Allergen Reactions   Metformin  Diarrhea    Diarrhea   Acetaminophen  Other (See Comments)    Excessive sedation at 500mg  dose   Amlodipine Hives, Itching and  Other (See Comments)    Face breaks out.   Lisinopril Swelling    Reaction:  Mouth swelling   Shellfish Allergy Itching   Sulfa Antibiotics      Past Medical History:  Diagnosis Date   Diabetes mellitus without complication (HCC)    Gout    High cholesterol    Hypertension    Stroke (HCC)    2016   Stroke [facility]      Past Surgical History:  Procedure Laterality Date   TUBAL LIGATION      Social History   Socioeconomic History   Marital status: Divorced    Spouse name: Not on file   Number of children: Not on file   Years of education: Not on file   Highest education level: Not on file  Occupational History   Not on file  Tobacco Use   Smoking status: Former    Current packs/day: 0.00    Types: Cigarettes    Quit date: 09/29/1973    Years since quitting: 50.7   Smokeless tobacco: Never  Vaping Use   Vaping status: Never Used  Substance and Sexual Activity   Alcohol use: No   Drug use: No   Sexual activity: Not Currently  Other Topics Concern   Not on file  Social History Narrative   Not on file   Social Drivers of Health   Financial Resource Strain: Not on file  Food Insecurity: No Food Insecurity (02/02/2023)   Hunger Vital Sign    Worried About Running Out of Food in the Last Year: Never true    Ran Out of Food in the Last Year: Never true  Transportation Needs: No Transportation Needs (02/02/2023)   PRAPARE - Administrator, Civil Service (Medical): No    Lack of Transportation (Non-Medical): No  Physical Activity: Unknown (07/04/2017)   Received from [facility]   Exercise Vital Sign    Days of Exercise per Week: 2 days    Minutes of Exercise per Session: Not on file  Stress: Not on file  Social Connections: Not on file  Intimate Partner Violence: Not At Risk (02/02/2023)   Humiliation, Afraid, Rape, and Kick questionnaire    Fear of Current or Ex-Partner: No    Emotionally Abused: No    Physically Abused: No     Sexually Abused: No    Family History  Problem Relation Age of Onset   Hypertension Other        all family members   CAD Mother    CAD Brother    Breast cancer Cousin        mat and pat cousins   Anemia Father    Anemia Son    Anemia Daughter      Current Outpatient Medications:    acetaminophen  (TYLENOL ) 325 MG tablet, Take 1-2 tablets (325-650 mg total) by mouth every 4 (four) hours as needed for mild pain., Disp: , Rfl:    atorvastatin  (LIPITOR ) 80 MG tablet, Take 1 tablet (80 mg total) by mouth daily at 6 PM., Disp: , Rfl:    carvedilol  (COREG ) 12.5 MG tablet, Take 1 tablet (12.5 mg total) by mouth 2 (two) times daily with  a meal., Disp: , Rfl:    chlorthalidone  (HYGROTON ) 25 MG tablet, Take 25 mg by mouth daily., Disp: , Rfl:    cloNIDine (CATAPRES - DOSED IN MG/24 HR) 0.2 mg/24hr patch, Place 0.2 mg onto the skin once a week., Disp: , Rfl:    clopidogrel  (PLAVIX ) 75 MG tablet, Take by mouth., Disp: , Rfl:    ergocalciferol (VITAMIN D2) 1.25 MG (50000 UT) capsule, , Disp: , Rfl:    glipiZIDE  (GLUCOTROL  XL) 2.5 MG 24 hr tablet, Take 2.5 mg by mouth daily., Disp: , Rfl:    hydrALAZINE  (APRESOLINE ) 100 MG tablet, Take 100 mg by mouth 2 (two) times daily., Disp: , Rfl:    IRON PO, Take 1 Dose by mouth daily. , Disp: , Rfl:    loratadine  (CLARITIN ) 10 MG tablet, Take 1 tablet (10 mg total) by mouth daily., Disp: , Rfl:    losartan  (COZAAR ) 100 MG tablet, Take 100 mg by mouth daily., Disp: , Rfl:    triamcinolone  ointment (KENALOG ) 0.1 %, Apply topically as needed. Home med., Disp: 30 g, Rfl: 0   alum & mag hydroxide-simeth (MAALOX/MYLANTA) 200-200-20 MG/5ML suspension, Take 30 mLs by mouth every 4 (four) hours as needed for indigestion. (Patient not taking: Reported on 07/01/2024), Disp: 355 mL, Rfl: 0   Lactase 9000 UNITS CHEW, Chew 1 tablet (9,000 Units total) by mouth 3 (three) times daily with meals. (Patient not taking: Reported on 07/01/2024), Disp: , Rfl: 0   polycarbophil  (FIBERCON) 625 MG tablet, Take 1 tablet (625 mg total) by mouth 2 (two) times daily. (Patient not taking: Reported on 04/01/2024), Disp: , Rfl:   Physical exam:  Vitals:   07/01/24 1524 07/01/24 1529  BP: (!) 174/76 (!) 170/74  Pulse: 72   Resp: 18   Temp: (!) 97.1 F (36.2 C)   TempSrc: Tympanic   SpO2: 100%   Weight: 174 lb 6.4 oz (79.1 kg)   Height: 5' 4 (1.626 m)    Physical Exam Constitutional:      Comments: Ambulates with a walker.  Appears in no acute distress  Cardiovascular:     Rate and Rhythm: Normal rate and regular rhythm.     Heart sounds: Normal heart sounds.  Pulmonary:     Effort: Pulmonary effort is normal.     Breath sounds: Normal breath sounds.  Abdominal:     General: Bowel sounds are normal.     Palpations: Abdomen is soft.  Skin:    General: Skin is warm and dry.  Neurological:     Mental Status: She is alert and oriented to person, place, and time.      I have personally reviewed labs listed below:    Latest Ref Rng & Units 07/01/2024    3:04 PM  CMP  Glucose 70 - 99 mg/dL 90   BUN 8 - 23 mg/dL 20   Creatinine 9.55 - 1.00 mg/dL 8.94   Sodium 864 - 854 mmol/L 126   Potassium 3.5 - 5.1 mmol/L 3.0   Chloride 98 - 111 mmol/L 92   CO2 22 - 32 mmol/L 22   Calcium  8.9 - 10.3 mg/dL 8.8       Latest Ref Rng & Units 07/01/2024    3:05 PM  CBC  WBC 4.0 - 10.5 K/uL 6.4   Hemoglobin 12.0 - 15.0 g/dL 89.2   Hematocrit 63.9 - 46.0 % 34.2   Platelets 150 - 400 K/uL 260       Assessment and plan-  Patient is a 88 y.o. female here for routine follow-up of microcytic anemia      Microcytic anemia secondary to iron deficiency anemia with thalassemia trait Hemoglobin improved to 10.7 from 9.2. Ferritin normal at 123, iron saturation at 10%. No intervention needed. - Monitor hemoglobin and iron levels every six months. - Follow-up in one year unless lab results change.  History of adverse reaction to IV iron therapy Adverse reactions to IV iron  include vision issues and deposition. Oral iron tolerated. - Avoid IV iron therapy unless necessary.         Visit Diagnosis 1. Microcytic anemia      Dr. Annah Skene, MD, MPH [facility] at [facility] [phone] 07/01/2024 4:14 PM

## 2024-07-01 NOTE — Progress Notes (Signed)
 Patient feeling okay, with no new or acute concerns at this time.

## 2024-07-03 ENCOUNTER — Ambulatory Visit: Admitting: Oncology

## 2024-07-03 ENCOUNTER — Other Ambulatory Visit

## 2024-10-03 ENCOUNTER — Other Ambulatory Visit: Payer: Self-pay | Admitting: Internal Medicine

## 2024-10-03 DIAGNOSIS — I701 Atherosclerosis of renal artery: Secondary | ICD-10-CM

## 2024-12-30 ENCOUNTER — Inpatient Hospital Stay

## 2025-07-01 ENCOUNTER — Inpatient Hospital Stay: Admitting: Oncology

## 2025-07-01 ENCOUNTER — Inpatient Hospital Stay
